# Patient Record
Sex: Male | Born: 1935
Health system: Southern US, Community
[De-identification: ages and names within clinical notes are randomized; demographics above are authoritative.]

## PROBLEM LIST (undated history)

## (undated) DIAGNOSIS — R7303 Prediabetes: Secondary | ICD-10-CM

## (undated) DIAGNOSIS — F0390 Unspecified dementia without behavioral disturbance: Secondary | ICD-10-CM

## (undated) DIAGNOSIS — I639 Cerebral infarction, unspecified: Secondary | ICD-10-CM

## (undated) DIAGNOSIS — F329 Major depressive disorder, single episode, unspecified: Secondary | ICD-10-CM

## (undated) DIAGNOSIS — E559 Vitamin D deficiency, unspecified: Secondary | ICD-10-CM

## (undated) DIAGNOSIS — F32A Depression, unspecified: Secondary | ICD-10-CM

## (undated) DIAGNOSIS — E039 Hypothyroidism, unspecified: Secondary | ICD-10-CM

## (undated) DIAGNOSIS — C61 Malignant neoplasm of prostate: Secondary | ICD-10-CM

## (undated) DIAGNOSIS — E291 Testicular hypofunction: Secondary | ICD-10-CM

## (undated) DIAGNOSIS — C679 Malignant neoplasm of bladder, unspecified: Secondary | ICD-10-CM

## (undated) DIAGNOSIS — I10 Essential (primary) hypertension: Secondary | ICD-10-CM

## (undated) HISTORY — DX: Essential (primary) hypertension: I10

## (undated) HISTORY — DX: Major depressive disorder, single episode, unspecified: F32.9

## (undated) HISTORY — DX: Prediabetes: R73.03

## (undated) HISTORY — DX: Malignant neoplasm of prostate: C61

## (undated) HISTORY — DX: Vitamin D deficiency, unspecified: E55.9

## (undated) HISTORY — DX: Hypothyroidism, unspecified: E03.9

## (undated) HISTORY — PX: BLADDER SURGERY: SHX569

## (undated) HISTORY — PX: PROSTATE SURGERY: SHX751

## (undated) HISTORY — DX: Testicular hypofunction: E29.1

## (undated) HISTORY — DX: Depression, unspecified: F32.A

---

## 1978-03-06 DIAGNOSIS — I639 Cerebral infarction, unspecified: Secondary | ICD-10-CM

## 1978-03-06 HISTORY — DX: Cerebral infarction, unspecified: I63.9

## 1997-04-06 HISTORY — PX: BIOPSY PROSTATE: PRO28

## 1997-06-04 ENCOUNTER — Encounter: Admission: RE | Admit: 1997-06-04 | Discharge: 1997-09-02 | Payer: Self-pay | Admitting: Radiation Oncology

## 1997-07-07 ENCOUNTER — Ambulatory Visit (HOSPITAL_COMMUNITY): Admission: RE | Admit: 1997-07-07 | Discharge: 1997-07-08 | Payer: Self-pay | Admitting: Urology

## 1997-09-03 HISTORY — PX: CYSTOURETHROSCOPY: SHX476

## 1997-09-21 ENCOUNTER — Observation Stay (HOSPITAL_COMMUNITY): Admission: RE | Admit: 1997-09-21 | Discharge: 1997-09-22 | Payer: Self-pay | Admitting: Urology

## 1999-01-12 ENCOUNTER — Encounter: Admission: RE | Admit: 1999-01-12 | Discharge: 1999-01-12 | Payer: Self-pay | Admitting: Urology

## 1999-01-12 ENCOUNTER — Encounter: Payer: Self-pay | Admitting: Urology

## 2000-04-06 ENCOUNTER — Encounter: Payer: Self-pay | Admitting: Internal Medicine

## 2000-04-06 ENCOUNTER — Ambulatory Visit (HOSPITAL_COMMUNITY): Admission: RE | Admit: 2000-04-06 | Discharge: 2000-04-06 | Payer: Self-pay | Admitting: Internal Medicine

## 2002-11-27 ENCOUNTER — Encounter: Payer: Self-pay | Admitting: Internal Medicine

## 2002-11-27 ENCOUNTER — Ambulatory Visit (HOSPITAL_COMMUNITY): Admission: RE | Admit: 2002-11-27 | Discharge: 2002-11-27 | Payer: Self-pay | Admitting: Internal Medicine

## 2003-12-16 ENCOUNTER — Ambulatory Visit (HOSPITAL_COMMUNITY): Admission: RE | Admit: 2003-12-16 | Discharge: 2003-12-16 | Payer: Self-pay | Admitting: Internal Medicine

## 2005-03-06 HISTORY — PX: PROSTATE SURGERY: SHX751

## 2005-10-05 ENCOUNTER — Encounter (HOSPITAL_COMMUNITY): Admission: RE | Admit: 2005-10-05 | Discharge: 2005-11-28 | Payer: Self-pay | Admitting: Urology

## 2005-11-23 ENCOUNTER — Ambulatory Visit (HOSPITAL_COMMUNITY): Admission: RE | Admit: 2005-11-23 | Discharge: 2005-11-23 | Payer: Self-pay | Admitting: Urology

## 2007-10-14 ENCOUNTER — Ambulatory Visit (HOSPITAL_COMMUNITY): Admission: RE | Admit: 2007-10-14 | Discharge: 2007-10-14 | Payer: Self-pay | Admitting: Internal Medicine

## 2007-10-31 ENCOUNTER — Ambulatory Visit: Payer: Self-pay | Admitting: Vascular Surgery

## 2010-03-27 ENCOUNTER — Encounter: Payer: Self-pay | Admitting: Vascular Surgery

## 2010-07-19 NOTE — Assessment & Plan Note (Signed)
OFFICE VISIT   [name] [name], [name] [name]  DOB:  1927-05-07                                       10/31/2007  HQION#:[id number]   The patient is a 77 year old male referred by Dr. Oneta Rack for evaluation  of abdominal aortic aneurysm.  He has a history of prostate cancer and  recently had a CT scan of his abdomen and pelvis performed for weight  loss.  CT scan was done without contrast due to a creatinine of 1.9.  The CT scan showed a saccular aneurysm arising from the infrarenal  abdominal aorta and possibly left common iliac artery.  The patient  denies any symptoms of abdominal pain or back pain.  He has no family  history of aneurysm.  He denies any fever, chills or infectious signs or  symptoms.  He has no history of night sweats.  His prostate cancer was  treated with radioactive seeds and he had followup with Dr. Patsi Sears  yesterday.  He stated that his cancer apparently is under control.   His atherosclerotic risk factors include hypertension, coronary artery  disease and elevated cholesterol.   PAST MEDICAL HISTORY:  His past medical history is otherwise fairly  unremarkable.   PAST SURGICAL HISTORY:  He has had no previous operations.   MEDICATIONS:  1. Include Diovan/HCT 160/12.5 once a day.  2. Fluoxetine 20 mg once a day.  3. Gemfibrozil 600 mg two a day.  4. Levoxyl 75 mcg once a day.  5. Verapamil hydrochloride 240 mg once a day.  6. Vesicare 10 mg once a day.  7. Vitamin D 8 tablets daily.   ALLERGIES:  He is allergic to penicillin which causes swelling and rash.   FAMILY HISTORY:  Remarkable for his mother who died of coronary artery  disease, his father died of a stroke.   SOCIAL HISTORY:  He is married and has one child.  He is a former  smoker, quit in 1997.   REVIEW OF SYSTEMS:  CONSTITUTIONAL:  He has had some weight loss  recently.  He is 5 feet 9 inches and 178 pounds.  Cardiac, pulmonary, GI, vascular, neurologic, orthopedic,  psychiatric,  hematologic review of systems are all negative.  RENAL:  He has some urinary frequency.  ENT:  He has had some decline in his hearing recently.   PHYSICAL EXAM:  Vital signs:  Blood pressure is 132/72 in the right arm,  heart rate 69 and regular.  HEENT:  Unremarkable.  Neck:  Has 2+ carotid  pulses without bruit.  Chest:  Clear to auscultation.  Cardiac:  Exam is  regular rate and rhythm.  Abdomen:  Soft, nontender, nondistended.  No  pulsatile mass.  Extremities:  He has 2+ radial, femoral, popliteal,  dorsalis pedis and posterior tibial pulses bilaterally.   I reviewed his CT scan from [facility] dated 10/15/2007.  Again, this  was done without IV contrast.  This shows a 3.4 cm abdominal aortic  aneurysm.  I am unable to identify any specific portion of the aorta  that has any saccular component to it.  It is basically a fusiform small  abdominal aortic aneurysm with no real significant focal outpouchings or  evidence of pseudoaneurysm.   IMPRESSION:  A 3.4 cm abdominal aortic aneurysm with no obvious evidence  of infectious component.   PLAN:  Is to  repeat his CT scan again in six months' time.  If the CT  scan is stable at that time we may go to once yearly or every 6 month  ultrasounds.   Janetta Hora. Fields, MD  Electronically Signed   CEF/MEDQ  D:  11/01/2007  T:  11/01/2007  Job:  [id number]   cc:   Lucky Cowboy, M.D.
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
## 2011-05-24 DIAGNOSIS — E559 Vitamin D deficiency, unspecified: Secondary | ICD-10-CM | POA: Diagnosis not present

## 2011-05-24 DIAGNOSIS — E782 Mixed hyperlipidemia: Secondary | ICD-10-CM | POA: Diagnosis not present

## 2011-05-24 DIAGNOSIS — Z79899 Other long term (current) drug therapy: Secondary | ICD-10-CM | POA: Diagnosis not present

## 2011-05-24 DIAGNOSIS — R7309 Other abnormal glucose: Secondary | ICD-10-CM | POA: Diagnosis not present

## 2011-05-24 DIAGNOSIS — I10 Essential (primary) hypertension: Secondary | ICD-10-CM | POA: Diagnosis not present

## 2011-08-29 DIAGNOSIS — R7309 Other abnormal glucose: Secondary | ICD-10-CM | POA: Diagnosis not present

## 2011-08-29 DIAGNOSIS — E782 Mixed hyperlipidemia: Secondary | ICD-10-CM | POA: Diagnosis not present

## 2011-08-29 DIAGNOSIS — C61 Malignant neoplasm of prostate: Secondary | ICD-10-CM | POA: Diagnosis not present

## 2011-08-29 DIAGNOSIS — Z125 Encounter for screening for malignant neoplasm of prostate: Secondary | ICD-10-CM | POA: Diagnosis not present

## 2011-08-29 DIAGNOSIS — I10 Essential (primary) hypertension: Secondary | ICD-10-CM | POA: Diagnosis not present

## 2011-08-29 DIAGNOSIS — E559 Vitamin D deficiency, unspecified: Secondary | ICD-10-CM | POA: Diagnosis not present

## 2011-08-29 DIAGNOSIS — Z79899 Other long term (current) drug therapy: Secondary | ICD-10-CM | POA: Diagnosis not present

## 2011-08-29 DIAGNOSIS — Z1212 Encounter for screening for malignant neoplasm of rectum: Secondary | ICD-10-CM | POA: Diagnosis not present

## 2011-08-29 DIAGNOSIS — N419 Inflammatory disease of prostate, unspecified: Secondary | ICD-10-CM | POA: Diagnosis not present

## 2011-10-02 DIAGNOSIS — C61 Malignant neoplasm of prostate: Secondary | ICD-10-CM | POA: Diagnosis not present

## 2011-10-02 DIAGNOSIS — C679 Malignant neoplasm of bladder, unspecified: Secondary | ICD-10-CM | POA: Diagnosis not present

## 2011-10-30 DIAGNOSIS — H5200 Hypermetropia, unspecified eye: Secondary | ICD-10-CM | POA: Diagnosis not present

## 2011-10-30 DIAGNOSIS — H2510 Age-related nuclear cataract, unspecified eye: Secondary | ICD-10-CM | POA: Diagnosis not present

## 2011-11-30 DIAGNOSIS — E559 Vitamin D deficiency, unspecified: Secondary | ICD-10-CM | POA: Diagnosis not present

## 2011-11-30 DIAGNOSIS — I10 Essential (primary) hypertension: Secondary | ICD-10-CM | POA: Diagnosis not present

## 2011-11-30 DIAGNOSIS — Z79899 Other long term (current) drug therapy: Secondary | ICD-10-CM | POA: Diagnosis not present

## 2011-11-30 DIAGNOSIS — R7309 Other abnormal glucose: Secondary | ICD-10-CM | POA: Diagnosis not present

## 2011-11-30 DIAGNOSIS — N3000 Acute cystitis without hematuria: Secondary | ICD-10-CM | POA: Diagnosis not present

## 2011-11-30 DIAGNOSIS — Z23 Encounter for immunization: Secondary | ICD-10-CM | POA: Diagnosis not present

## 2011-11-30 DIAGNOSIS — E782 Mixed hyperlipidemia: Secondary | ICD-10-CM | POA: Diagnosis not present

## 2011-12-04 DIAGNOSIS — H903 Sensorineural hearing loss, bilateral: Secondary | ICD-10-CM | POA: Diagnosis not present

## 2012-01-01 DIAGNOSIS — C61 Malignant neoplasm of prostate: Secondary | ICD-10-CM | POA: Diagnosis not present

## 2012-03-13 DIAGNOSIS — E559 Vitamin D deficiency, unspecified: Secondary | ICD-10-CM | POA: Diagnosis not present

## 2012-03-13 DIAGNOSIS — N3000 Acute cystitis without hematuria: Secondary | ICD-10-CM | POA: Diagnosis not present

## 2012-03-13 DIAGNOSIS — C61 Malignant neoplasm of prostate: Secondary | ICD-10-CM | POA: Diagnosis not present

## 2012-03-13 DIAGNOSIS — I10 Essential (primary) hypertension: Secondary | ICD-10-CM | POA: Diagnosis not present

## 2012-03-13 DIAGNOSIS — Z79899 Other long term (current) drug therapy: Secondary | ICD-10-CM | POA: Diagnosis not present

## 2012-03-13 DIAGNOSIS — R7309 Other abnormal glucose: Secondary | ICD-10-CM | POA: Diagnosis not present

## 2012-03-13 DIAGNOSIS — E782 Mixed hyperlipidemia: Secondary | ICD-10-CM | POA: Diagnosis not present

## 2012-03-25 DIAGNOSIS — C61 Malignant neoplasm of prostate: Secondary | ICD-10-CM | POA: Diagnosis not present

## 2012-04-12 DIAGNOSIS — C61 Malignant neoplasm of prostate: Secondary | ICD-10-CM | POA: Diagnosis not present

## 2012-04-12 DIAGNOSIS — C679 Malignant neoplasm of bladder, unspecified: Secondary | ICD-10-CM | POA: Diagnosis not present

## 2012-04-17 ENCOUNTER — Other Ambulatory Visit (HOSPITAL_COMMUNITY): Payer: Self-pay | Admitting: Urology

## 2012-04-17 DIAGNOSIS — C61 Malignant neoplasm of prostate: Secondary | ICD-10-CM

## 2012-04-17 DIAGNOSIS — C679 Malignant neoplasm of bladder, unspecified: Secondary | ICD-10-CM

## 2012-04-26 ENCOUNTER — Encounter (HOSPITAL_COMMUNITY)
Admission: RE | Admit: 2012-04-26 | Discharge: 2012-04-26 | Disposition: A | Discharge status: Home / Self Care | Payer: Medicare Other | Source: Ambulatory Visit | Attending: Urology | Admitting: Urology

## 2012-04-26 DIAGNOSIS — C61 Malignant neoplasm of prostate: Secondary | ICD-10-CM | POA: Diagnosis not present

## 2012-04-26 DIAGNOSIS — C679 Malignant neoplasm of bladder, unspecified: Secondary | ICD-10-CM | POA: Diagnosis not present

## 2012-04-26 MED ORDER — TECHNETIUM TC 99M MEDRONATE IV KIT: 25.0000 | PACK | Freq: Once | INTRAVENOUS | Status: AC | PRN

## 2012-04-26 MED ORDER — TECHNETIUM TC 99M MEDRONATE IV KIT
25.0000 | PACK | Freq: Once | INTRAVENOUS | Status: AC | PRN
  Administered 2012-04-26: 25 via INTRAVENOUS

## 2012-05-21 DIAGNOSIS — C679 Malignant neoplasm of bladder, unspecified: Secondary | ICD-10-CM | POA: Diagnosis not present

## 2012-05-21 DIAGNOSIS — C61 Malignant neoplasm of prostate: Secondary | ICD-10-CM | POA: Diagnosis not present

## 2012-05-24 DIAGNOSIS — H6120 Impacted cerumen, unspecified ear: Secondary | ICD-10-CM | POA: Diagnosis not present

## 2012-06-19 DIAGNOSIS — E782 Mixed hyperlipidemia: Secondary | ICD-10-CM | POA: Diagnosis not present

## 2012-06-19 DIAGNOSIS — I10 Essential (primary) hypertension: Secondary | ICD-10-CM | POA: Diagnosis not present

## 2012-06-19 DIAGNOSIS — R7309 Other abnormal glucose: Secondary | ICD-10-CM | POA: Diagnosis not present

## 2012-07-16 DIAGNOSIS — F341 Dysthymic disorder: Secondary | ICD-10-CM | POA: Diagnosis not present

## 2012-07-16 DIAGNOSIS — I10 Essential (primary) hypertension: Secondary | ICD-10-CM | POA: Diagnosis not present

## 2012-07-16 DIAGNOSIS — N3000 Acute cystitis without hematuria: Secondary | ICD-10-CM | POA: Diagnosis not present

## 2012-08-12 DIAGNOSIS — H6120 Impacted cerumen, unspecified ear: Secondary | ICD-10-CM | POA: Diagnosis not present

## 2012-08-12 DIAGNOSIS — H905 Unspecified sensorineural hearing loss: Secondary | ICD-10-CM | POA: Diagnosis not present

## 2012-08-12 DIAGNOSIS — M1710 Unilateral primary osteoarthritis, unspecified knee: Secondary | ICD-10-CM | POA: Diagnosis not present

## 2012-09-23 DIAGNOSIS — H9209 Otalgia, unspecified ear: Secondary | ICD-10-CM | POA: Diagnosis not present

## 2012-09-23 DIAGNOSIS — H7200 Central perforation of tympanic membrane, unspecified ear: Secondary | ICD-10-CM | POA: Diagnosis not present

## 2012-09-23 DIAGNOSIS — H6120 Impacted cerumen, unspecified ear: Secondary | ICD-10-CM | POA: Diagnosis not present

## 2012-09-23 DIAGNOSIS — H908 Mixed conductive and sensorineural hearing loss, unspecified: Secondary | ICD-10-CM | POA: Diagnosis not present

## 2012-09-25 DIAGNOSIS — E782 Mixed hyperlipidemia: Secondary | ICD-10-CM | POA: Diagnosis not present

## 2012-09-25 DIAGNOSIS — Z79899 Other long term (current) drug therapy: Secondary | ICD-10-CM | POA: Diagnosis not present

## 2012-09-25 DIAGNOSIS — R7309 Other abnormal glucose: Secondary | ICD-10-CM | POA: Diagnosis not present

## 2012-09-25 DIAGNOSIS — Z1212 Encounter for screening for malignant neoplasm of rectum: Secondary | ICD-10-CM | POA: Diagnosis not present

## 2012-09-25 DIAGNOSIS — C61 Malignant neoplasm of prostate: Secondary | ICD-10-CM | POA: Diagnosis not present

## 2012-09-25 DIAGNOSIS — I10 Essential (primary) hypertension: Secondary | ICD-10-CM | POA: Diagnosis not present

## 2012-09-25 DIAGNOSIS — E559 Vitamin D deficiency, unspecified: Secondary | ICD-10-CM | POA: Diagnosis not present

## 2012-11-11 DIAGNOSIS — N3000 Acute cystitis without hematuria: Secondary | ICD-10-CM | POA: Diagnosis not present

## 2012-11-20 DIAGNOSIS — C61 Malignant neoplasm of prostate: Secondary | ICD-10-CM | POA: Diagnosis not present

## 2012-11-22 DIAGNOSIS — C61 Malignant neoplasm of prostate: Secondary | ICD-10-CM | POA: Diagnosis not present

## 2012-11-22 DIAGNOSIS — R32 Unspecified urinary incontinence: Secondary | ICD-10-CM | POA: Diagnosis not present

## 2012-11-22 DIAGNOSIS — Z8551 Personal history of malignant neoplasm of bladder: Secondary | ICD-10-CM | POA: Diagnosis not present

## 2012-11-22 DIAGNOSIS — R972 Elevated prostate specific antigen [PSA]: Secondary | ICD-10-CM | POA: Diagnosis not present

## 2012-12-18 DIAGNOSIS — Z23 Encounter for immunization: Secondary | ICD-10-CM | POA: Diagnosis not present

## 2012-12-24 ENCOUNTER — Encounter: Payer: Self-pay | Admitting: *Deleted

## 2012-12-24 ENCOUNTER — Encounter: Payer: Self-pay | Admitting: Cardiovascular Disease

## 2012-12-25 ENCOUNTER — Ambulatory Visit (INDEPENDENT_AMBULATORY_CARE_PROVIDER_SITE_OTHER): Payer: Medicare Other | Admitting: Cardiovascular Disease

## 2012-12-25 ENCOUNTER — Encounter: Payer: Self-pay | Admitting: Cardiovascular Disease

## 2012-12-25 VITALS — BP 136/76 | HR 59 | Ht 69.0 in | Wt 170.4 lb

## 2012-12-25 DIAGNOSIS — R0600 Dyspnea, unspecified: Secondary | ICD-10-CM

## 2012-12-25 DIAGNOSIS — I10 Essential (primary) hypertension: Secondary | ICD-10-CM | POA: Diagnosis not present

## 2012-12-25 DIAGNOSIS — I4510 Unspecified right bundle-branch block: Secondary | ICD-10-CM | POA: Insufficient documentation

## 2012-12-25 DIAGNOSIS — R5383 Other fatigue: Secondary | ICD-10-CM

## 2012-12-25 DIAGNOSIS — R0609 Other forms of dyspnea: Secondary | ICD-10-CM

## 2012-12-25 DIAGNOSIS — R5381 Other malaise: Secondary | ICD-10-CM | POA: Diagnosis not present

## 2012-12-25 NOTE — Assessment & Plan Note (Signed)
Not likely related to heart No evidence of chronotropic incompetence F/u echo.  Consider stopping diuretic for BP with elevated BUN/Cr

## 2012-12-25 NOTE — Assessment & Plan Note (Signed)
Avoid beta blockers with relatively slow HR and RBBB with left axis deviation.  Consider stopping diuretic with elevated BUN/Cr

## 2012-12-25 NOTE — Progress Notes (Signed)
Patient ID: [name] [name], male   DOB: [birth year], 79 y.o.   MRN: [id number] 79 yo referred by Dr Oneta Rack.  His notes are not very legible.  CRF;s elevated lipids and HTN.  ? Referred for fatigue dyspnea and abnormal ECG.  No previous heart issues Describes ? MI in early 90;s but he said he doubted it Just had some stomach pain.  He is retired. Hard of hearing and seems a bit depressed. Married 65 years.  Has slowed down some with mild exertional dyspnea Denies chest pain to me.  ECG with RBBB and no high grade AV block.  Compliant with HTN meds  Notes and labs from primary reviewed 09/25/12  K 4 Cr 1.66 BUN 32 LDL 81 TSH 2.1 A1c 5.6  ROS: Denies fever, malais, weight loss, blurry vision, decreased visual acuity, cough, sputum, SOB, hemoptysis, pleuritic pain, palpitaitons, heartburn, abdominal pain, melena, lower extremity edema, claudication, or rash.  All other systems reviewed and negative   General: Affect appropriate Healthy:  appears stated age HEENT: normal Neck supple with no adenopathy JVP normal no bruits no thyromegaly Lungs clear with no wheezing and good diaphragmatic motion Heart:  S1/S2 no murmur,rub, gallop or click PMI normal Abdomen: benighn, BS positve, no tenderness, no AAA no bruit.  No HSM or HJR Distal pulses intact with no bruits No edema Neuro non-focal Skin warm and dry No muscular weakness  Medications Current Outpatient Prescriptions  Medication Sig Dispense Refill  . aspirin EC 81 MG tablet Take 81 mg by mouth daily.      . citalopram (CELEXA) 20 MG tablet Take 20 mg by mouth daily.      Marland Kitchen levothyroxine (SYNTHROID, LEVOTHROID) 75 MCG tablet Take 75 mcg by mouth daily before breakfast.      . losartan-hydrochlorothiazide (HYZAAR) 100-12.5 MG per tablet Take 1 tablet by mouth daily.      . solifenacin (VESICARE) 10 MG tablet Take by mouth daily.      . verapamil (COVERA HS) 240 MG (CO) 24 hr tablet Take 240 mg by mouth at bedtime.       No current  facility-administered medications for this visit.    Allergies Penicillins  Family History: No family history on file.  Social History: History   Social History  . Marital Status: Married    Spouse Name: N/A    Number of Children: N/A  . Years of Education: N/A   Occupational History  . Not on file.   Social History Main Topics  . Smoking status: Former Smoker    Types: Cigarettes    Quit date: 12/25/1996  . Smokeless tobacco: Never Used  . Alcohol Use: No  . Drug Use: No  . Sexual Activity: Not on file   Other Topics Concern  . Not on file   Social History Narrative  . No narrative on file    Electrocardiogram:SR rate 52 RBBB LAD no old MI  Assessment and Plan

## 2012-12-25 NOTE — Assessment & Plan Note (Signed)
Normal cardiopulmonary exam.  No AS murmur  Echo.  Labs ok

## 2012-12-25 NOTE — Patient Instructions (Signed)

## 2012-12-25 NOTE — Assessment & Plan Note (Signed)
No old ECG to compare Benign no high grade heart block

## 2013-01-01 DIAGNOSIS — E559 Vitamin D deficiency, unspecified: Secondary | ICD-10-CM | POA: Diagnosis not present

## 2013-01-01 DIAGNOSIS — E782 Mixed hyperlipidemia: Secondary | ICD-10-CM | POA: Diagnosis not present

## 2013-01-01 DIAGNOSIS — Z79899 Other long term (current) drug therapy: Secondary | ICD-10-CM | POA: Diagnosis not present

## 2013-01-01 DIAGNOSIS — R7309 Other abnormal glucose: Secondary | ICD-10-CM | POA: Diagnosis not present

## 2013-01-01 DIAGNOSIS — I10 Essential (primary) hypertension: Secondary | ICD-10-CM | POA: Diagnosis not present

## 2013-01-01 DIAGNOSIS — N3000 Acute cystitis without hematuria: Secondary | ICD-10-CM | POA: Diagnosis not present

## 2013-01-09 ENCOUNTER — Ambulatory Visit (HOSPITAL_COMMUNITY): Payer: Medicare Other | Attending: Cardiology

## 2013-01-09 DIAGNOSIS — R0989 Other specified symptoms and signs involving the circulatory and respiratory systems: Secondary | ICD-10-CM | POA: Insufficient documentation

## 2013-01-09 DIAGNOSIS — R0600 Dyspnea, unspecified: Secondary | ICD-10-CM

## 2013-01-09 DIAGNOSIS — I4510 Unspecified right bundle-branch block: Secondary | ICD-10-CM | POA: Diagnosis not present

## 2013-01-09 DIAGNOSIS — R5381 Other malaise: Secondary | ICD-10-CM | POA: Insufficient documentation

## 2013-01-09 DIAGNOSIS — I079 Rheumatic tricuspid valve disease, unspecified: Secondary | ICD-10-CM | POA: Diagnosis not present

## 2013-01-09 DIAGNOSIS — R0602 Shortness of breath: Secondary | ICD-10-CM | POA: Diagnosis not present

## 2013-01-09 DIAGNOSIS — R5383 Other fatigue: Secondary | ICD-10-CM

## 2013-01-09 DIAGNOSIS — I359 Nonrheumatic aortic valve disorder, unspecified: Secondary | ICD-10-CM | POA: Insufficient documentation

## 2013-01-09 DIAGNOSIS — I10 Essential (primary) hypertension: Secondary | ICD-10-CM | POA: Diagnosis not present

## 2013-01-09 DIAGNOSIS — Z87891 Personal history of nicotine dependence: Secondary | ICD-10-CM | POA: Insufficient documentation

## 2013-01-09 DIAGNOSIS — I059 Rheumatic mitral valve disease, unspecified: Secondary | ICD-10-CM | POA: Insufficient documentation

## 2013-01-09 DIAGNOSIS — R0609 Other forms of dyspnea: Secondary | ICD-10-CM | POA: Diagnosis not present

## 2013-01-09 NOTE — Progress Notes (Signed)
Echocardiogram performed.  

## 2013-01-13 ENCOUNTER — Telehealth: Payer: Self-pay | Admitting: *Deleted

## 2013-01-13 NOTE — Telephone Encounter (Signed)
PT  AND PT'S WIFE AWARE  ECHO  OKAY  AV SCLEROSIS./CY

## 2013-03-13 ENCOUNTER — Other Ambulatory Visit: Payer: Self-pay | Admitting: Internal Medicine

## 2013-04-01 DIAGNOSIS — E039 Hypothyroidism, unspecified: Secondary | ICD-10-CM | POA: Insufficient documentation

## 2013-04-01 DIAGNOSIS — R7303 Prediabetes: Secondary | ICD-10-CM | POA: Insufficient documentation

## 2013-04-01 DIAGNOSIS — E349 Endocrine disorder, unspecified: Secondary | ICD-10-CM | POA: Insufficient documentation

## 2013-04-01 DIAGNOSIS — E559 Vitamin D deficiency, unspecified: Secondary | ICD-10-CM | POA: Insufficient documentation

## 2013-04-02 ENCOUNTER — Encounter: Payer: Self-pay | Admitting: Internal Medicine

## 2013-04-02 ENCOUNTER — Ambulatory Visit (INDEPENDENT_AMBULATORY_CARE_PROVIDER_SITE_OTHER): Payer: PRIVATE HEALTH INSURANCE | Admitting: Internal Medicine

## 2013-04-02 VITALS — BP 124/60 | HR 64 | Temp 98.2°F | Resp 16 | Wt 168.6 lb

## 2013-04-02 DIAGNOSIS — I10 Essential (primary) hypertension: Secondary | ICD-10-CM | POA: Diagnosis not present

## 2013-04-02 DIAGNOSIS — E559 Vitamin D deficiency, unspecified: Secondary | ICD-10-CM | POA: Diagnosis not present

## 2013-04-02 DIAGNOSIS — E785 Hyperlipidemia, unspecified: Secondary | ICD-10-CM | POA: Insufficient documentation

## 2013-04-02 DIAGNOSIS — R7309 Other abnormal glucose: Secondary | ICD-10-CM

## 2013-04-02 DIAGNOSIS — Z79899 Other long term (current) drug therapy: Secondary | ICD-10-CM | POA: Diagnosis not present

## 2013-04-02 DIAGNOSIS — R7303 Prediabetes: Secondary | ICD-10-CM

## 2013-04-02 DIAGNOSIS — E782 Mixed hyperlipidemia: Secondary | ICD-10-CM

## 2013-04-02 NOTE — Patient Instructions (Signed)

## 2013-04-02 NOTE — Progress Notes (Signed)
Patient ID: [name] [name], male   DOB: [birth year], 81 y.o.   MRN: [id number]   This very nice 80 y.o. MWM presents for 3 month follow up with Hypertension, Hyperlipidemia, Pre-Diabetes and Vitamin D Deficiency.    HTN predates since 62. BP has been controlled at home. Today's BP: 124/60 mmHg . Patient denies any cardiac type chest pain, palpitations, dyspnea/orthopnea/PND, dizziness, claudication, or dependent edema.   Hyperlipidemia is controlled with diet & meds. Last Cholesterol was  181 , Triglycerides were 298, HDL 38 and LDL 83 in Oct 2014 at goal.. Patient denies myalgias or other med SE's.    Also, the patient has history of PreDiabetes with A1c 5.8% in 2011 and with last A1c of  5.7% in Oct 2014. Patient denies any symptoms of reactive hypoglycemia, diabetic polys, paresthesias or visual blurring.   Further, Patient has history of Vitamin D Deficiency of 28 in 2008 with last vitamin D of 49 in Oct 2014. Patient supplements vitamin D without any suspected side-effects.    Medication List         aspirin EC 81 MG tablet  Take 81 mg by mouth daily.     cholecalciferol 1000 UNITS tablet  Commonly known as:  VITAMIN D  Take 1,000 Units by mouth 2 (two) times daily.     citalopram 20 MG tablet  Commonly known as:  CELEXA  Take 20 mg by mouth daily.     levothyroxine 75 MCG tablet  Commonly known as:  SYNTHROID, LEVOTHROID  Take 75 mcg by mouth daily before breakfast.     losartan-hydrochlorothiazide 100-12.5 MG per tablet  Commonly known as:  HYZAAR  take 1 tablet by mouth every morning for blood pressure     solifenacin 10 MG tablet  Commonly known as:  VESICARE  Take by mouth daily.     verapamil 240 MG (CO) 24 hr tablet  Commonly known as:  COVERA HS  Take 240 mg by mouth at bedtime.         Allergies  Allergen Reactions  . Penicillins     PMHx:   Past Medical History  Diagnosis Date  . HTN (hypertension)   . Vitamin D deficiency   . Prostate  & Bladder  cancer   . Hypothyroid   . Prediabetes   . Other testicular hypofunction   . Depression     FHx:    Reviewed / unchanged  SHx:    Reviewed / unchanged  Systems Review: Constitutional: Denies fever, chills, wt changes, headaches, insomnia, fatigue, night sweats, change in appetite. Eyes: Denies redness, blurred vision, diplopia, discharge, itchy, watery eyes.  ENT: Denies discharge, congestion, post nasal drip, epistaxis, sore throat, earache, hearing loss, dental pain, tinnitus, vertigo, sinus pain, snoring.  CV: Denies chest pain, palpitations, irregular heartbeat, syncope, dyspnea, diaphoresis, orthopnea, PND, claudication, edema. Respiratory: denies cough, dyspnea, DOE, pleurisy, hoarseness, laryngitis, wheezing.  Gastrointestinal: Denies dysphagia, odynophagia, heartburn, reflux, water brash, abdominal pain or cramps, nausea, vomiting, bloating, diarrhea, constipation, hematemesis, melena, hematochezia,  or hemorrhoids. Genitourinary: Denies dysuria, frequency, urgency, nocturia, hesitancy, discharge, hematuria, flank pain. Musculoskeletal: Denies arthralgias, myalgias, stiffness, jt. swelling, pain, limp, strain/sprain.  Skin: Denies pruritus, rash, hives, warts, acne, eczema, change in skin lesion(s). Neuro: No weakness, tremor, incoordination, spasms, paresthesia, or pain. Psychiatric: Denies confusion, memory loss, or sensory loss. Endo: Denies change in weight, skin, hair change.  Heme/Lymph: No excessive bleeding, bruising, orenlarged lymph nodes.  Filed Vitals:   04/02/13 1401  BP: 124/60  Pulse:  64  Temp: 98.2 F (36.8 C)  Resp: 16    Estimated body mass index is 24.89 kg/(m^2) as calculated from the following:   Height as of 12/25/12: 5\' 9"  (1.753 m).   Weight as of this encounter: 168 lb 9.6 oz (76.476 kg).  On Exam: Appears well nourished - in no distress. Eyes: PERRLA, EOMs, conjunctiva no swelling or erythema. Sinuses: No frontal/maxillary  tenderness ENT/Mouth: EAC's clear, TM's nl w/o erythema, bulging. Nares clear w/o erythema, swelling, exudates. Oropharynx clear without erythema or exudates. Oral hygiene is good. Tongue normal, non obstructing. Hearing intact.  Neck: Supple. Thyroid nl. Car 2+/2+ without bruits, nodes or JVD. Chest: Respirations nl with BS clear & equal w/o rales, rhonchi, wheezing or stridor.  Cor: Heart sounds normal w/ regular rate and rhythm without sig. murmurs, gallops, clicks, or rubs. Peripheral pulses normal and equal  without edema.  Abdomen: Soft & bowel sounds normal. Non-tender w/o guarding, rebound, hernias, masses, or organomegaly.  Lymphatics: Unremarkable.  Musculoskeletal: Full ROM all peripheral extremities, joint stability, 5/5 strength, and normal gait.  Skin: Warm, dry without exposed rashes, lesions, ecchymosis apparent.  Neuro: Cranial nerves intact, reflexes equal bilaterally. Sensory-motor testing grossly intact. Tendon reflexes grossly intact.  Pysch: Alert & oriented x 3. Insight and judgement nl & appropriate. No ideations.  Assessment and Plan:  1. Hypertension - Continue monitor blood pressure at home. Continue diet/meds same.  2. Hyperlipidemia - Continue diet/meds, exercise,& lifestyle modifications. Continue monitor periodic cholesterol/liver & renal functions   3. Pre-diabetes/Insulin Resistance - Continue diet, exercise, lifestyle modifications. Monitor appropriate labs.  4. Vitamin D Deficiency - Continue supplementation.  Recommended regular exercise, BP monitoring, weight control, and discussed med and SE's. Recommended labs to assess and monitor clinical status. Further disposition pending results of labs.

## 2013-04-03 LAB — CBC WITH DIFFERENTIAL/PLATELET
BASOS ABS: 0 10*3/uL (ref 0.0–0.1)
Basophils Relative: 0 % (ref 0–1)
EOS ABS: 0.2 10*3/uL (ref 0.0–0.7)
EOS PCT: 3 % (ref 0–5)
HEMATOCRIT: 40.7 % (ref 39.0–52.0)
Hemoglobin: 13.8 g/dL (ref 13.0–17.0)
Lymphocytes Relative: 17 % (ref 12–46)
Lymphs Abs: 1.1 10*3/uL (ref 0.7–4.0)
MCH: 29.7 pg (ref 26.0–34.0)
MCHC: 33.9 g/dL (ref 30.0–36.0)
MCV: 87.5 fL (ref 78.0–100.0)
MONO ABS: 0.4 10*3/uL (ref 0.1–1.0)
Monocytes Relative: 6 % (ref 3–12)
Neutro Abs: 4.5 10*3/uL (ref 1.7–7.7)
Neutrophils Relative %: 74 % (ref 43–77)
PLATELETS: 189 10*3/uL (ref 150–400)
RBC: 4.65 MIL/uL (ref 4.22–5.81)
RDW: 14.4 % (ref 11.5–15.5)
WBC: 6.1 10*3/uL (ref 4.0–10.5)

## 2013-04-03 LAB — HEMOGLOBIN A1C
HEMOGLOBIN A1C: 5.7 % — AB (ref <5.7)
MEAN PLASMA GLUCOSE: 117 mg/dL — AB (ref <117)

## 2013-04-03 LAB — LIPID PANEL
Cholesterol: 174 mg/dL (ref 0–200)
HDL: 38 mg/dL — AB (ref >39)
LDL CALC: 79 mg/dL (ref 0–99)
Total CHOL/HDL Ratio: 4.6 Ratio
Triglycerides: 283 mg/dL — ABNORMAL HIGH (ref <150)
VLDL: 57 mg/dL — ABNORMAL HIGH (ref 0–40)

## 2013-04-03 LAB — HEPATIC FUNCTION PANEL
ALBUMIN: 4 g/dL (ref 3.5–5.2)
ALK PHOS: 76 U/L (ref 39–117)
ALT: 16 U/L (ref 0–53)
AST: 18 U/L (ref 0–37)
BILIRUBIN TOTAL: 0.5 mg/dL (ref 0.2–1.2)
Bilirubin, Direct: 0.1 mg/dL (ref 0.0–0.3)
Indirect Bilirubin: 0.4 mg/dL (ref 0.2–1.2)
TOTAL PROTEIN: 6.3 g/dL (ref 6.0–8.3)

## 2013-04-03 LAB — MAGNESIUM: MAGNESIUM: 1.8 mg/dL (ref 1.5–2.5)

## 2013-04-03 LAB — BASIC METABOLIC PANEL WITH GFR
BUN: 19 mg/dL (ref 6–23)
CALCIUM: 9.8 mg/dL (ref 8.4–10.5)
CHLORIDE: 101 meq/L (ref 96–112)
CO2: 27 meq/L (ref 19–32)
CREATININE: 1.35 mg/dL (ref 0.50–1.35)
GFR, Est African American: 56 mL/min — ABNORMAL LOW
GFR, Est Non African American: 48 mL/min — ABNORMAL LOW
Glucose, Bld: 96 mg/dL (ref 70–99)
Potassium: 3.4 mEq/L — ABNORMAL LOW (ref 3.5–5.3)
Sodium: 139 mEq/L (ref 135–145)

## 2013-04-03 LAB — INSULIN, FASTING: INSULIN FASTING, SERUM: 74 u[IU]/mL — AB (ref 3–28)

## 2013-04-03 LAB — TSH: TSH: 1.93 u[IU]/mL (ref 0.350–4.500)

## 2013-04-03 LAB — VITAMIN D 25 HYDROXY (VIT D DEFICIENCY, FRACTURES): VIT D 25 HYDROXY: 46 ng/mL (ref 30–89)

## 2013-05-19 DIAGNOSIS — C679 Malignant neoplasm of bladder, unspecified: Secondary | ICD-10-CM | POA: Diagnosis not present

## 2013-05-19 DIAGNOSIS — C61 Malignant neoplasm of prostate: Secondary | ICD-10-CM | POA: Diagnosis not present

## 2013-05-21 ENCOUNTER — Other Ambulatory Visit: Payer: Self-pay | Admitting: Internal Medicine

## 2013-05-23 DIAGNOSIS — C679 Malignant neoplasm of bladder, unspecified: Secondary | ICD-10-CM | POA: Diagnosis not present

## 2013-05-23 DIAGNOSIS — N35919 Unspecified urethral stricture, male, unspecified site: Secondary | ICD-10-CM | POA: Diagnosis not present

## 2013-05-23 DIAGNOSIS — C61 Malignant neoplasm of prostate: Secondary | ICD-10-CM | POA: Diagnosis not present

## 2013-07-02 ENCOUNTER — Encounter: Payer: Self-pay | Admitting: Emergency Medicine

## 2013-07-02 ENCOUNTER — Ambulatory Visit (INDEPENDENT_AMBULATORY_CARE_PROVIDER_SITE_OTHER): Payer: Medicare Other | Admitting: Emergency Medicine

## 2013-07-02 VITALS — BP 134/70 | HR 66 | Temp 98.2°F | Resp 16 | Ht 67.5 in | Wt 165.0 lb

## 2013-07-02 DIAGNOSIS — E782 Mixed hyperlipidemia: Secondary | ICD-10-CM

## 2013-07-02 DIAGNOSIS — R7309 Other abnormal glucose: Secondary | ICD-10-CM | POA: Diagnosis not present

## 2013-07-02 DIAGNOSIS — I10 Essential (primary) hypertension: Secondary | ICD-10-CM

## 2013-07-02 LAB — CBC WITH DIFFERENTIAL/PLATELET
Basophils Absolute: 0 10*3/uL (ref 0.0–0.1)
Basophils Relative: 0 % (ref 0–1)
EOS ABS: 0.2 10*3/uL (ref 0.0–0.7)
Eosinophils Relative: 3 % (ref 0–5)
HEMATOCRIT: 39.6 % (ref 39.0–52.0)
HEMOGLOBIN: 13.4 g/dL (ref 13.0–17.0)
LYMPHS ABS: 1.4 10*3/uL (ref 0.7–4.0)
Lymphocytes Relative: 20 % (ref 12–46)
MCH: 29.8 pg (ref 26.0–34.0)
MCHC: 33.8 g/dL (ref 30.0–36.0)
MCV: 88 fL (ref 78.0–100.0)
MONO ABS: 0.5 10*3/uL (ref 0.1–1.0)
MONOS PCT: 7 % (ref 3–12)
NEUTROS PCT: 70 % (ref 43–77)
Neutro Abs: 4.8 10*3/uL (ref 1.7–7.7)
Platelets: 180 10*3/uL (ref 150–400)
RBC: 4.5 MIL/uL (ref 4.22–5.81)
RDW: 14.6 % (ref 11.5–15.5)
WBC: 6.8 10*3/uL (ref 4.0–10.5)

## 2013-07-02 LAB — BASIC METABOLIC PANEL WITH GFR
BUN: 22 mg/dL (ref 6–23)
CALCIUM: 9.3 mg/dL (ref 8.4–10.5)
CHLORIDE: 104 meq/L (ref 96–112)
CO2: 28 meq/L (ref 19–32)
CREATININE: 1.22 mg/dL (ref 0.50–1.35)
GFR, Est African American: 63 mL/min
GFR, Est Non African American: 54 mL/min — ABNORMAL LOW
GLUCOSE: 85 mg/dL (ref 70–99)
Potassium: 3.8 mEq/L (ref 3.5–5.3)
Sodium: 138 mEq/L (ref 135–145)

## 2013-07-02 LAB — HEPATIC FUNCTION PANEL
ALBUMIN: 4.1 g/dL (ref 3.5–5.2)
ALT: 21 U/L (ref 0–53)
AST: 20 U/L (ref 0–37)
Alkaline Phosphatase: 64 U/L (ref 39–117)
Bilirubin, Direct: 0.1 mg/dL (ref 0.0–0.3)
Indirect Bilirubin: 0.4 mg/dL (ref 0.2–1.2)
TOTAL PROTEIN: 6.1 g/dL (ref 6.0–8.3)
Total Bilirubin: 0.5 mg/dL (ref 0.2–1.2)

## 2013-07-02 LAB — LIPID PANEL
CHOLESTEROL: 183 mg/dL (ref 0–200)
HDL: 41 mg/dL (ref >39)
LDL CALC: 98 mg/dL (ref 0–99)
TRIGLYCERIDES: 220 mg/dL — AB (ref <150)
Total CHOL/HDL Ratio: 4.5 Ratio
VLDL: 44 mg/dL — ABNORMAL HIGH (ref 0–40)

## 2013-07-02 NOTE — Patient Instructions (Signed)
Stress Management Stress is a state of physical or mental tension that often results from changes in your life or normal routine. Some common causes of stress are:  Death of a loved one.  Injuries or severe illnesses.  Getting fired or changing jobs.  Moving into a new home. Other causes may be:  Sexual problems.  Business or financial losses.  Taking on a large debt.  Regular conflict with someone at home or at work.  Constant tiredness from lack of sleep. It is not just bad things that are stressful. It may be stressful to:  Win the lottery.  Get married.  Buy a new car. The amount of stress that can be easily tolerated varies from person to person. Changes generally cause stress, regardless of the types of change. Too much stress can affect your health. It may lead to physical or emotional problems. Too little stress (boredom) may also become stressful. SUGGESTIONS TO REDUCE STRESS:  Talk things over with your family and friends. It often is helpful to share your concerns and worries. If you feel your problem is serious, you may want to get help from a professional counselor.  Consider your problems one at a time instead of lumping them all together. Trying to take care of everything at once may seem impossible. List all the things you need to do and then start with the most important one. Set a goal to accomplish 2 or 3 things each day. If you expect to do too many in a single day you will naturally fail, causing you to feel even more stressed.  Do not use alcohol or drugs to relieve stress. Although you may feel better for a short time, they do not remove the problems that caused the stress. They can also be habit forming.  Exercise regularly - at least 3 times per week. Physical exercise can help to relieve that "uptight" feeling and will relax you.  The shortest distance between despair and hope is often a good night's sleep.  Go to bed and get up on time allowing  yourself time for appointments without being rushed.  Take a short "time-out" period from any stressful situation that occurs during the day. Close your eyes and take some deep breaths. Starting with the muscles in your face, tense them, hold it for a few seconds, then relax. Repeat this with the muscles in your neck, shoulders, hand, stomach, back and legs.  Take good care of yourself. Eat a balanced diet and get plenty of rest.  Schedule time for having fun. Take a break from your daily routine to relax. HOME CARE INSTRUCTIONS   Call if you feel overwhelmed by your problems and feel you can no longer manage them on your own.  Return immediately if you feel like hurting yourself or someone else. Document Released: 08/16/2000 Document Revised: 05/15/2011 Document Reviewed: 10/15/2012 [employer] Patient Information 2014 [employer].  

## 2013-07-02 NOTE — Progress Notes (Signed)
Subjective:    Patient ID: [name] [name], male    DOB: [birth year], 80 y.o.   MRN: [id number]  HPI Comments: 80 yo WM presents for 3 month F/U for HTN, Cholesterol, Pre-Dm, D. deficient .He is under increased stress with wives declining health. He has had to increase activity around the house but is not getting routine cardio. He notes BP is good at home. He notes mild fatigue with extra chores but resolves with rest.   Last labs  CHOL         174   04/02/2013 HDL           38   04/02/2013 LDLCALC       79   04/02/2013 TRIG         283   04/02/2013 CHOLHDL      4.6   04/02/2013 ALT           16   04/02/2013 AST           18   04/02/2013 ALKPHOS       76   04/02/2013 BILITOT      0.5   04/02/2013 CREATININE     1.35   04/02/2013 BUN              19   04/02/2013 NA              139   04/02/2013 K               3.4   04/02/2013 CL              101   04/02/2013 CO2              27   04/02/2013 WBC      6.1   04/02/2013 HGB     13.8   04/02/2013 HCT     40.7   04/02/2013 MCV     87.5   04/02/2013 PLT      189   04/02/2013 HGBA1C      5.7   04/02/2013   Hypertension    Current Outpatient Prescriptions on File Prior to Visit  Medication Sig Dispense Refill  . aspirin EC 81 MG tablet Take 81 mg by mouth daily.      . cholecalciferol (VITAMIN D) 1000 UNITS tablet Take 1,000 Units by mouth 2 (two) times daily.      . citalopram (CELEXA) 20 MG tablet Take 20 mg by mouth daily.      Marland Kitchen levothyroxine (SYNTHROID, LEVOTHROID) 75 MCG tablet Take 75 mcg by mouth daily before breakfast.      . losartan-hydrochlorothiazide (HYZAAR) 100-12.5 MG per tablet take 1 tablet by mouth every morning for blood pressure  90 tablet  1  . solifenacin (VESICARE) 10 MG tablet Take by mouth daily.      . verapamil (CALAN-SR) 240 MG CR tablet take 1 tablet by mouth daily  90 tablet  3   No current facility-administered medications on file prior to visit.   Allergies  Allergen Reactions  . Penicillins    Past Medical  History  Diagnosis Date  . HTN (hypertension)   . Vitamin D deficiency   . Prostate cancer   . Hypothyroid   . Prediabetes   . Other testicular hypofunction   . Depression      Review of Systems  Constitutional: Positive for fatigue.  All other systems reviewed and are negative.  BP 134/70  Pulse 66  Temp(Src) 98.2  F (36.8 C) (Temporal)  Resp 16  Ht 5' 7.5" (1.715 m)  Wt 165 lb (74.844 kg)  BMI 25.45 kg/m2     Objective:   Physical Exam  Nursing note and vitals reviewed. Constitutional: He is oriented to person, place, and time. He appears well-developed and well-nourished.  HENT:  Head: Normocephalic and atraumatic.  Right Ear: External ear normal.  Left Ear: External ear normal.  Nose: Nose normal.  Eyes: Conjunctivae and EOM are normal.  Neck: Normal range of motion. Neck supple. No JVD present. No thyromegaly present.  Cardiovascular: Normal rate, regular rhythm, normal heart sounds and intact distal pulses.   Pulmonary/Chest: Effort normal and breath sounds normal.  Abdominal: Soft. Bowel sounds are normal. He exhibits no distension and no mass. There is no tenderness. There is no rebound and no guarding.  Musculoskeletal: Normal range of motion. He exhibits no edema and no tenderness.  Lymphadenopathy:    He has no cervical adenopathy.  Neurological: He is alert and oriented to person, place, and time. He has normal reflexes. No cranial nerve deficit. Coordination normal.  Skin: Skin is warm and dry.  Psychiatric: He has a normal mood and affect. His behavior is normal. Judgment and thought content normal.          Assessment & Plan:  1.  3 month F/U for HTN, Cholesterol, Pre-Dm, D. Deficient. Needs healthy diet, cardio QD and obtain healthy weight. Check Labs, Check BP if >130/80 call office   2. Fatigue- check labs, increase activity and H2O vs Stress- Advised to call if symptoms increase for RX

## 2013-07-03 LAB — HEMOGLOBIN A1C
HEMOGLOBIN A1C: 6 % — AB (ref <5.7)
Mean Plasma Glucose: 126 mg/dL — ABNORMAL HIGH (ref <117)

## 2013-08-22 ENCOUNTER — Other Ambulatory Visit: Payer: Self-pay | Admitting: Internal Medicine

## 2013-09-30 ENCOUNTER — Encounter: Payer: Self-pay | Admitting: Internal Medicine

## 2013-11-20 ENCOUNTER — Other Ambulatory Visit: Payer: Self-pay | Admitting: Internal Medicine

## 2013-11-20 NOTE — Telephone Encounter (Signed)
Patient needs OV past due

## 2013-12-20 ENCOUNTER — Other Ambulatory Visit: Payer: Self-pay | Admitting: Internal Medicine

## 2014-01-28 ENCOUNTER — Other Ambulatory Visit: Payer: Self-pay

## 2014-01-28 MED ORDER — LOSARTAN POTASSIUM-HCTZ 100-12.5 MG PO TABS: 1.0000 | ORAL_TABLET | Freq: Every morning | ORAL | Status: DC

## 2014-02-26 ENCOUNTER — Other Ambulatory Visit: Payer: Self-pay | Admitting: Physician Assistant

## 2014-03-10 ENCOUNTER — Encounter: Payer: Self-pay | Admitting: Physician Assistant

## 2014-03-10 ENCOUNTER — Ambulatory Visit (INDEPENDENT_AMBULATORY_CARE_PROVIDER_SITE_OTHER): Payer: Medicare Other | Admitting: Physician Assistant

## 2014-03-10 VITALS — BP 124/62 | HR 72 | Temp 98.2°F | Resp 16 | Ht 67.5 in | Wt 158.0 lb

## 2014-03-10 DIAGNOSIS — R7309 Other abnormal glucose: Secondary | ICD-10-CM | POA: Diagnosis not present

## 2014-03-10 DIAGNOSIS — E785 Hyperlipidemia, unspecified: Secondary | ICD-10-CM

## 2014-03-10 DIAGNOSIS — I10 Essential (primary) hypertension: Secondary | ICD-10-CM

## 2014-03-10 DIAGNOSIS — E559 Vitamin D deficiency, unspecified: Secondary | ICD-10-CM

## 2014-03-10 DIAGNOSIS — E039 Hypothyroidism, unspecified: Secondary | ICD-10-CM | POA: Diagnosis not present

## 2014-03-10 DIAGNOSIS — N3281 Overactive bladder: Secondary | ICD-10-CM

## 2014-03-10 DIAGNOSIS — Z79899 Other long term (current) drug therapy: Secondary | ICD-10-CM | POA: Diagnosis not present

## 2014-03-10 DIAGNOSIS — F32A Depression, unspecified: Secondary | ICD-10-CM

## 2014-03-10 DIAGNOSIS — R7303 Prediabetes: Secondary | ICD-10-CM

## 2014-03-10 DIAGNOSIS — F329 Major depressive disorder, single episode, unspecified: Secondary | ICD-10-CM

## 2014-03-10 LAB — CBC WITH DIFFERENTIAL/PLATELET
BASOS ABS: 0 10*3/uL (ref 0.0–0.1)
BASOS PCT: 0 % (ref 0–1)
EOS ABS: 0.2 10*3/uL (ref 0.0–0.7)
Eosinophils Relative: 3 % (ref 0–5)
HCT: 43.1 % (ref 39.0–52.0)
HEMOGLOBIN: 14.4 g/dL (ref 13.0–17.0)
LYMPHS ABS: 1.1 10*3/uL (ref 0.7–4.0)
Lymphocytes Relative: 17 % (ref 12–46)
MCH: 30 pg (ref 26.0–34.0)
MCHC: 33.4 g/dL (ref 30.0–36.0)
MCV: 89.8 fL (ref 78.0–100.0)
MPV: 9.9 fL (ref 8.6–12.4)
Monocytes Absolute: 0.4 10*3/uL (ref 0.1–1.0)
Monocytes Relative: 6 % (ref 3–12)
NEUTROS PCT: 74 % (ref 43–77)
Neutro Abs: 4.8 10*3/uL (ref 1.7–7.7)
Platelets: 215 10*3/uL (ref 150–400)
RBC: 4.8 MIL/uL (ref 4.22–5.81)
RDW: 14.3 % (ref 11.5–15.5)
WBC: 6.5 10*3/uL (ref 4.0–10.5)

## 2014-03-10 MED ORDER — CITALOPRAM HYDROBROMIDE 20 MG PO TABS
ORAL_TABLET | ORAL | Status: DC
Start: 2014-03-10 — End: 2014-04-01

## 2014-03-10 MED ORDER — LOSARTAN POTASSIUM-HCTZ 100-12.5 MG PO TABS: 1.0000 | ORAL_TABLET | Freq: Every morning | ORAL | Status: DC

## 2014-03-10 NOTE — Patient Instructions (Addendum)
- Continue medications as prescribed.  Please increase Celexa 20mg - Take 1 and 1/2 tablets daily for mood and depression.  If you are having more depression or suicidal thoughts, then please go to [facility] immediately or call 911.  [facility] Address: [address] [place], [place] [address]  Phone: [phone]  Please call minister and ask about activities you can do to be more active.  Please follow up in 3 weeks with Dr. Melford Aase.  Depression Depression refers to feeling sad, low, down in the dumps, blue, gloomy, or empty. In general, there are two kinds of depression: 1. Normal sadness or normal grief. This kind of depression is one that we all feel from time to time after upsetting life experiences, such as the loss of a job or the ending of a relationship. This kind of depression is considered normal, is short lived, and resolves within a few days to 2 weeks. Depression experienced after the loss of a loved one (bereavement) often lasts longer than 2 weeks but normally gets better with time. 2. Clinical depression. This kind of depression lasts longer than normal sadness or normal grief or interferes with your ability to function at home, at work, and in school. It also interferes with your personal relationships. It affects almost every aspect of your life. Clinical depression is an illness. Symptoms of depression can also be caused by conditions other than those mentioned above, such as:  Physical illness. Some physical illnesses, including underactive thyroid gland (hypothyroidism), severe anemia, specific types of cancer, diabetes, uncontrolled seizures, heart and lung problems, strokes, and chronic pain are commonly associated with symptoms of depression.  Side effects of some prescription medicine. In some people, certain types of medicine can cause symptoms of depression.  Substance abuse. Abuse of alcohol and illicit drugs can cause symptoms of  depression. SYMPTOMS Symptoms of normal sadness and normal grief include the following:  Feeling sad or crying for short periods of time.  Not caring about anything (apathy).  Difficulty sleeping or sleeping too much.  No longer able to enjoy the things you used to enjoy.  Desire to be by oneself all the time (social isolation).  Lack of energy or motivation.  Difficulty concentrating or remembering.  Change in appetite or weight.  Restlessness or agitation. Symptoms of clinical depression include the same symptoms of normal sadness or normal grief and also the following symptoms:  Feeling sad or crying all the time.  Feelings of guilt or worthlessness.  Feelings of hopelessness or helplessness.  Thoughts of suicide or the desire to harm yourself (suicidal ideation).  Loss of touch with reality (psychotic symptoms). Seeing or hearing things that are not real (hallucinations) or having false beliefs about your life or the people around you (delusions and paranoia). DIAGNOSIS  The diagnosis of clinical depression is usually based on how bad the symptoms are and how long they have lasted. Your health care provider will also ask you questions about your medical history and substance use to find out if physical illness, use of prescription medicine, or substance abuse is causing your depression. Your health care provider may also order blood tests. TREATMENT  Often, normal sadness and normal grief do not require treatment. However, sometimes antidepressant medicine is given for bereavement to ease the depressive symptoms until they resolve. The treatment for clinical depression depends on how bad the symptoms are but often includes antidepressant medicine, counseling with a mental health professional, or both. Your health care provider will  help to determine what treatment is best for you. Depression caused by physical illness usually goes away with appropriate medical treatment of  the illness. If prescription medicine is causing depression, talk with your health care provider about stopping the medicine, decreasing the dose, or changing to another medicine. Depression caused by the abuse of alcohol or illicit drugs goes away when you stop using these substances. Some adults need professional help in order to stop drinking or using drugs. SEEK IMMEDIATE MEDICAL CARE IF:  You have thoughts about hurting yourself or others.  You lose touch with reality (have psychotic symptoms).  You are taking medicine for depression and have a serious side effect. FOR MORE INFORMATION  National Alliance on Mental Illness: www.nami.[employer]: https://carter.com/ Document Released: 02/18/2000 Document Revised: 07/07/2013 Document Reviewed: 05/22/2011 [facility] Patient Information 2015 [place], [place]. This information is not intended to replace advice given to you by your health care provider. Make sure you discuss any questions you have with your health care provider.

## 2014-03-10 NOTE — Progress Notes (Signed)
Assessment and Plan:  1. Essential hypertension Continue Hyzaar and Verapamil as prescribed.  Monitor blood pressure at home.  Reminder to go to the ER if any CP, SOB, nausea, dizziness, severe HA, changes vision/speech, left arm numbness and tingling, and jaw pain. - CBC with Differential - BASIC METABOLIC PANEL WITH GFR - Hepatic function panel  2. Hyperlipidemia Please follow recommended diet and exercise.  Check cholesterol. - Lipid panel  3. Prediabetes Please follow recommended diet and exercise.  Check A1C and Insulin levels. - Hemoglobin A1c - Insulin, fasting  4. Hypothyroidism, unspecified hypothyroidism type Please continue Levothyroxine as prescribed. Reminded to take on an empty stomach 30-23mins before food.  Check TSH level. - TSH  5. Vitamin D deficiency Continue vitamin D as prescribed.  Will check vitamin D level at next visit, due to insurance not covering costs for level check today.  6. Encounter for long-term (current) use of medications Will monitor kidney and liver function. - CBC with Differential - BASIC METABOLIC PANEL WITH GFR - Hepatic function panel  7. Depression -Increase Celexa 20mg  to 1 and 1/2 tablets per day- citalopram (CELEXA) 20 MG tablet; take 1 and 1/2 tablets by mouth once daily for mood and depression  Dispense: 60 tablet; Refill: PRN -Told patient to contact Hospice for grief counseling. -Told patient to contact minister to see about daily activities to help with loneliness.  -Told patient if he is having more depression or suicidal thoughts to go to [facility] immediately or call 911.  Patient expressed understanding.  Dr. Melford Aase also spoke with patient for about 15-20 minutes to evaluate his symptoms and suicidal thoughts.  Dr. Melford Aase agreed with treatment plan.  8. Overactive Bladder -Told patient to continue Vesicare as prescribed.  Continue diet and meds as discussed. Further disposition pending results of labs. Discussed  medication effects and SE's.  Pt agreed to treatment plan. Please follow up in 3 weeks with Dr. Melford Aase for medication recheck on Celexa.  HPI A Caucasian 81 y.o. male  presents for 9 month follow up with hypertension, hyperlipidemia, prediabetes, hypothyroidism, depression and vitamin D.  Patient's last visit was 07/02/13 with labs.  Patient states he is feeling more depressed and lonely due to his wife passing in September 2015.  He does admit to suicidal thoughts, but no plans.  Patient is preparing meals for himself and states he is taking care of their cats at home.  Patient states that church friends are calling occasionally to check up on him and see how he is doing.  Patient states he does have one daughter and she calls him regularly to check in to see how he is doing.  Patient denies loss of appetite or trouble sleeping.  His blood pressure has been controlled at home, today their BP is BP: 124/62 mmHg.  He is taking Hyzaar 100-12.5mg  and Verapamil 240mg .  He does not workout. He denies chest pain, shortness of breath, dizziness.   He is not on cholesterol medication and denies myalgias. His cholesterol is not at goal. The cholesterol last visit was:   Lab Results  Component Value Date   CHOL 183 07/02/2013   HDL 41 07/02/2013   LDLCALC 98 07/02/2013   TRIG 220* 07/02/2013   CHOLHDL 4.5 07/02/2013   He has not been working on diet and exercise for prediabetes, and denies increased appetite, polydipsia and polyuria. Last A1C in the office was:  Lab Results  Component Value Date   HGBA1C 6.0* 07/02/2013  Patient is on Vitamin D supplement. - 2,000 IU daily Lab Results  Component Value Date   VD25OH 46 04/02/2013     Hypothyroidism- Taking Levothyroxine 94mcg- Patient states he does take some medications when first getting up and then takes the rest after breakfast.  Current Medications:  Current Outpatient Prescriptions on File Prior to Visit  Medication Sig Dispense Refill  .  aspirin EC 81 MG tablet Take 81 mg by mouth daily.    . cholecalciferol (VITAMIN D) 1000 UNITS tablet Take 1,000 Units by mouth 2 (two) times daily.    . citalopram (CELEXA) 20 MG tablet take 1 tablet by mouth once daily for MOOD 90 tablet PRN  . levothyroxine (SYNTHROID, LEVOTHROID) 75 MCG tablet take 1 tablet by mouth once daily 90 tablet PRN  . solifenacin (VESICARE) 10 MG tablet Take by mouth daily.    . verapamil (CALAN-SR) 240 MG CR tablet take 1 tablet by mouth daily 90 tablet 3   No current facility-administered medications on file prior to visit.   Medical History:  Past Medical History  Diagnosis Date  . HTN (hypertension)   . Vitamin D deficiency   . Prostate cancer   . Hypothyroid   . Prediabetes   . Other testicular hypofunction   . Depression    Allergies:  Allergies  Allergen Reactions  . Penicillins     ROS- Review of Systems  Constitutional: Negative.   HENT: Negative.   Eyes: Negative.   Respiratory: Negative.   Cardiovascular: Negative.   Gastrointestinal: Negative.   Genitourinary: Negative.        Except admits to wearing urinary pad and states he has to get up and run to bathroom in the morning (about 2-3 times.  Neurological: Negative.   Psychiatric/Behavioral: Positive for depression. The patient is nervous/anxious.        Suicidal thoughts, but no plans.   Family history- Review and unchanged Social history- Review and unchanged  Physical Exam: BP 124/62 mmHg  Pulse 72  Temp(Src) 98.2 F (36.8 C) (Temporal)  Resp 16  Ht 5' 7.5" (1.715 m)  Wt 158 lb (71.668 kg)  BMI 24.37 kg/m2  SpO2 97% Wt Readings from Last 3 Encounters:  03/10/14 158 lb (71.668 kg)  07/02/13 165 lb (74.844 kg)  04/02/13 168 lb 9.6 oz (76.476 kg)  Vitals Reviewed. General Appearance: Well nourished, in no apparent distress. Eyes: PERRLA, EOMIs, conjunctiva no swelling or erythema.  No scleral icterus. Sinuses: No Frontal/maxillary tenderness ENT/Mouth: Ext aud  canals clear, TMs without erythema, edema or bulging. No erythema, swelling, or exudate on posterior pharynx.  Tonsils not swollen or erythematous. Patient wears hearing aids. Neck: Supple, thyroid normal.  Respiratory: Respiratory effort normal, CTAB.  No w/r/r or stridor.  Cardio: RRR with no M/R/Gs. Brisk peripheral pulses without edema.  Abdomen: Soft, + normal BS.  Non tender, no guarding, rebound, hernias, masses. Lymphatics: Non tender without lymphadenopathy.  Musculoskeletal: Full ROM, 5/5 strength, normal gait.  Skin: Warm, dry intact without rashes, lesions, ecchymosis.  Neuro: Cranial nerves intact. Normal muscle tone, no cerebellar symptoms. Sensation intact.  Psych: Awake and oriented X 3, blunted affect, Insight and Judgment appropriate.    Temara Lanum, Stephani Police, PA-C 12:27 PM [facility]

## 2014-03-11 LAB — HEPATIC FUNCTION PANEL
ALBUMIN: 4.2 g/dL (ref 3.5–5.2)
ALT: 22 U/L (ref 0–53)
AST: 18 U/L (ref 0–37)
Alkaline Phosphatase: 89 U/L (ref 39–117)
BILIRUBIN TOTAL: 0.5 mg/dL (ref 0.2–1.2)
Bilirubin, Direct: 0.1 mg/dL (ref 0.0–0.3)
Indirect Bilirubin: 0.4 mg/dL (ref 0.2–1.2)
Total Protein: 6.5 g/dL (ref 6.0–8.3)

## 2014-03-11 LAB — BASIC METABOLIC PANEL WITH GFR
BUN: 19 mg/dL (ref 6–23)
CHLORIDE: 103 meq/L (ref 96–112)
CO2: 30 meq/L (ref 19–32)
CREATININE: 1.36 mg/dL — AB (ref 0.50–1.35)
Calcium: 9.6 mg/dL (ref 8.4–10.5)
GFR, EST NON AFRICAN AMERICAN: 47 mL/min — AB
GFR, Est African American: 55 mL/min — ABNORMAL LOW
Glucose, Bld: 84 mg/dL (ref 70–99)
Potassium: 3.9 mEq/L (ref 3.5–5.3)
SODIUM: 139 meq/L (ref 135–145)

## 2014-03-11 LAB — LIPID PANEL
CHOLESTEROL: 182 mg/dL (ref 0–200)
HDL: 48 mg/dL (ref >39)
LDL Cholesterol: 81 mg/dL (ref 0–99)
Total CHOL/HDL Ratio: 3.8 Ratio
Triglycerides: 266 mg/dL — ABNORMAL HIGH (ref <150)
VLDL: 53 mg/dL — ABNORMAL HIGH (ref 0–40)

## 2014-03-11 LAB — HEMOGLOBIN A1C
Hgb A1c MFr Bld: 5.6 % (ref <5.7)
Mean Plasma Glucose: 114 mg/dL (ref <117)

## 2014-03-11 LAB — INSULIN, FASTING: Insulin fasting, serum: 8.9 u[IU]/mL (ref 2.0–19.6)

## 2014-03-11 LAB — TSH: TSH: 2.882 u[IU]/mL (ref 0.350–4.500)

## 2014-04-01 ENCOUNTER — Ambulatory Visit: Payer: Self-pay | Admitting: Internal Medicine

## 2014-04-01 ENCOUNTER — Encounter: Payer: Self-pay | Admitting: Physician Assistant

## 2014-04-01 ENCOUNTER — Ambulatory Visit (INDEPENDENT_AMBULATORY_CARE_PROVIDER_SITE_OTHER): Payer: Medicare Other | Admitting: Physician Assistant

## 2014-04-01 VITALS — BP 122/58 | HR 66 | Temp 98.2°F | Resp 16 | Ht 67.5 in | Wt 160.0 lb

## 2014-04-01 DIAGNOSIS — I10 Essential (primary) hypertension: Secondary | ICD-10-CM

## 2014-04-01 DIAGNOSIS — F329 Major depressive disorder, single episode, unspecified: Secondary | ICD-10-CM | POA: Diagnosis not present

## 2014-04-01 DIAGNOSIS — F32A Depression, unspecified: Secondary | ICD-10-CM

## 2014-04-01 DIAGNOSIS — N3281 Overactive bladder: Secondary | ICD-10-CM

## 2014-04-01 MED ORDER — CITALOPRAM HYDROBROMIDE 20 MG PO TABS
ORAL_TABLET | ORAL | Status: DC
Start: 2014-04-01 — End: 2014-07-27

## 2014-04-01 NOTE — Patient Instructions (Addendum)
-Increae Celexa to 2 tablets daily.   Please continue all other medications as prescribed.  If you are having more depression or suicidal thoughts, then please go to [facility] immediately or call 911.  [facility] 1. Address: [address] [place], [place] [address]  2. Phone: [phone]  Please call minister and ask about activities you can do to be more active.  Please follow up in 2 months (April 2016) with Dr. Melford Aase.  Grief Reaction Grief is a normal response to the death of someone close to you. Feelings of fear, anger, and guilt can affect almost everyone who loses someone they love. Symptoms of depression are also common. These include problems with sleep, loss of appetite, and lack of energy. These grief reaction symptoms often last for weeks to months after a loss. They may also return during special times that remind you of the person you lost, such as an anniversary or birthday. Anxiety, insomnia, irritability, and deep depression may last beyond the period of normal grief. If you experience these feelings for 6 months or longer, you may have clinical depression. Clinical depression requires further medical attention. If you think that you have clinical depression, you should contact your caregiver. If you have a history of depression or a family history of depression, you are at greater risk of clinical depression. You are also at greater risk of developing clinical depression if the loss was traumatic or the loss was of someone with whom you had unresolved issues.  A grief reaction can become complicated by being blocked. This means being unable to cry or express extreme emotions. This may prolong the grieving period and worsen the emotional effects of the loss. Mourning is a natural event in human life. A healthy grief reaction is one that is not blocked. It requires a time of sadness and readjustment. It is very important to share your sorrow and fear with others,  especially close friends and family. Professional counselors and clergy can also help you process your grief. Document Released: 02/20/2005 Document Revised: 07/07/2013 Document Reviewed: 10/31/2005 [facility] Patient Information 2015 [place], [place]. This information is not intended to replace advice given to you by your health care provider. Make sure you discuss any questions you have with your health care provider.   Depression Depression refers to feeling sad, low, down in the dumps, blue, gloomy, or empty. In general, there are two kinds of depression: 3. Normal sadness or normal grief. This kind of depression is one that we all feel from time to time after upsetting life experiences, such as the loss of a job or the ending of a relationship. This kind of depression is considered normal, is short lived, and resolves within a few days to 2 weeks. Depression experienced after the loss of a loved one (bereavement) often lasts longer than 2 weeks but normally gets better with time. 4. Clinical depression. This kind of depression lasts longer than normal sadness or normal grief or interferes with your ability to function at home, at work, and in school. It also interferes with your personal relationships. It affects almost every aspect of your life. Clinical depression is an illness. Symptoms of depression can also be caused by conditions other than those mentioned above, such as:  Physical illness. Some physical illnesses, including underactive thyroid gland (hypothyroidism), severe anemia, specific types of cancer, diabetes, uncontrolled seizures, heart and lung problems, strokes, and chronic pain are commonly associated with symptoms of depression.  Side effects of some  prescription medicine. In some people, certain types of medicine can cause symptoms of depression.  Substance abuse. Abuse of alcohol and illicit drugs can cause symptoms of depression. SYMPTOMS Symptoms of normal sadness and  normal grief include the following:  Feeling sad or crying for short periods of time.  Not caring about anything (apathy).  Difficulty sleeping or sleeping too much.  No longer able to enjoy the things you used to enjoy.  Desire to be by oneself all the time (social isolation).  Lack of energy or motivation.  Difficulty concentrating or remembering.  Change in appetite or weight.  Restlessness or agitation. Symptoms of clinical depression include the same symptoms of normal sadness or normal grief and also the following symptoms:  Feeling sad or crying all the time.  Feelings of guilt or worthlessness.  Feelings of hopelessness or helplessness.  Thoughts of suicide or the desire to harm yourself (suicidal ideation).  Loss of touch with reality (psychotic symptoms). Seeing or hearing things that are not real (hallucinations) or having false beliefs about your life or the people around you (delusions and paranoia). DIAGNOSIS  The diagnosis of clinical depression is usually based on how bad the symptoms are and how long they have lasted. Your health care provider will also ask you questions about your medical history and substance use to find out if physical illness, use of prescription medicine, or substance abuse is causing your depression. Your health care provider may also order blood tests. TREATMENT  Often, normal sadness and normal grief do not require treatment. However, sometimes antidepressant medicine is given for bereavement to ease the depressive symptoms until they resolve. The treatment for clinical depression depends on how bad the symptoms are but often includes antidepressant medicine, counseling with a mental health professional, or both. Your health care provider will help to determine what treatment is best for you. Depression caused by physical illness usually goes away with appropriate medical treatment of the illness. If prescription medicine is causing  depression, talk with your health care provider about stopping the medicine, decreasing the dose, or changing to another medicine. Depression caused by the abuse of alcohol or illicit drugs goes away when you stop using these substances. Some adults need professional help in order to stop drinking or using drugs. SEEK IMMEDIATE MEDICAL CARE IF:  You have thoughts about hurting yourself or others.  You lose touch with reality (have psychotic symptoms).  You are taking medicine for depression and have a serious side effect. FOR MORE INFORMATION  National Alliance on Mental Illness: www.nami.[employer]: https://carter.com/ Document Released: 02/18/2000 Document Revised: 07/07/2013 Document Reviewed: 05/22/2011 [facility] Patient Information 2015 [place], [place]. This information is not intended to replace advice given to you by your health care provider. Make sure you discuss any questions you have with your health care provider.

## 2014-04-01 NOTE — Progress Notes (Signed)
HPI A Caucasian 81 y.o. male presents to the office today for follow up on depression due to wife passing away in September 2015.  Last visit was 03/10/14.  We increased his dose of Celexa 20mg  to 1 and 1/2 tablets per day.  Patient states he does not notice a difference.  He admits to even taking occasional 2 tablets per day.  He does admit to occasional suicidal thought, but no plans.  Patient is preparing meals for himself, going out grocery shopping and taking care of cats at home.  He admits to calling the minister at church and keeping in contact with him and even going out to dinner with him.  His daughter calls every day to check in on her father to see how he is doing.  Patient does not open up very easily about his feelings.  Patient denies loss of appetite or trouble sleeping.  Patient does admit to nocturia and is taking Vesicare as prescribed.  He was wondering if there is anything else he can try.  He is getting up around 3am and going to the bathroom 2-3 times at night.  He does not know if he is drinking water right before bed.    His blood pressure has been controlled at home, today their BP is BP: (!) 122/58 mmHg.  He is taking Hyzaar 100-12.5mg  and Verapamil 240mg .  He does not workout. He denies chest pain, shortness of breath, dizziness.   Past Medical History  Diagnosis Date  . HTN (hypertension)   . Vitamin D deficiency   . Prostate cancer   . Hypothyroid   . Prediabetes   . Other testicular hypofunction   . Depression    Current Medications:  Current Outpatient Prescriptions on File Prior to Visit  Medication Sig Dispense Refill  . aspirin EC 81 MG tablet Take 81 mg by mouth daily.    . cholecalciferol (VITAMIN D) 1000 UNITS tablet Take 1,000 Units by mouth 2 (two) times daily.    . citalopram (CELEXA) 20 MG tablet take 1 and 1/2 tablets by mouth once daily for mood and depression 60 tablet PRN  . levothyroxine (SYNTHROID, LEVOTHROID) 75 MCG tablet take 1 tablet by mouth  once daily 90 tablet PRN  . losartan-hydrochlorothiazide (HYZAAR) 100-12.5 MG per tablet Take 1 tablet by mouth every morning. 90 tablet 1  . solifenacin (VESICARE) 10 MG tablet Take by mouth daily.    . verapamil (CALAN-SR) 240 MG CR tablet take 1 tablet by mouth daily 90 tablet 3   No current facility-administered medications on file prior to visit.   Allergies:  Allergies  Allergen Reactions  . Penicillins     ROS- Review of Systems  Constitutional: Negative.  Negative for fever, chills, malaise/fatigue and diaphoresis.        No loss of appetite. Is going out and buying groceries and taking care of cats at home.  HENT: Negative.   Eyes: Negative.   Respiratory: Negative.  Negative for shortness of breath and wheezing.   Cardiovascular: Negative.  Negative for chest pain.  Gastrointestinal: Negative.  Negative for nausea, vomiting, abdominal pain, diarrhea and constipation.  Genitourinary: Positive for frequency. Negative for dysuria and urgency.  Neurological: Negative.  Negative for dizziness and headaches.  Psychiatric/Behavioral: Positive for depression and suicidal ideas.       Occasional SI thought, but no plans.    Physical Exam: BP 122/58 mmHg  Pulse 66  Temp(Src) 98.2 F (36.8 C) (Temporal)  Resp 16  Ht  5' 7.5" (1.715 m)  Wt 160 lb (72.576 kg)  BMI 24.68 kg/m2  SpO2 97% Wt Readings from Last 3 Encounters:  04/01/14 160 lb (72.576 kg)  03/10/14 158 lb (71.668 kg)  07/02/13 165 lb (74.844 kg)  Vitals Reviewed. General Appearance: Well nourished, in no apparent distress. Eyes: PERRLA, EOMIs, conjunctiva no swelling or erythema.  No scleral icterus. Neck: Supple, thyroid normal.  Respiratory: Respiratory effort normal, CTAB.  No w/r/r or stridor.  Cardio: RRR with no M/R/Gs. Brisk peripheral pulses without edema.  Abdomen: Soft, + normal BS.  Non tender, no guarding, rebound, hernias, masses. Lymphatics: Non tender without lymphadenopathy.  Musculoskeletal: Full  ROM, 5/5 strength, normal gait.  Skin: Warm, dry intact without rashes, lesions, ecchymosis.  Neuro: Cranial nerves intact. Normal muscle tone, no cerebellar symptoms. Sensation intact.  Psych: Awake and oriented X 3, blunted affect, Insight and Judgment appropriate.   Assessment and Plan:  1. Depression -Increase Celexa 20mg  to 2 tablets per day -Told patient to contact Hospice for grief counseling. -Told patient to keep in contact with minister and to ask him about activities.  -Told patient to keep talking with daughter every day. -Told patient if he is having more depression or suicidal thoughts to go to [facility] immediately or call 911.  Patient expressed understanding.  Dr. Melford Aase agreed with treatment plan.  2. Overactive bladder Continue Vesicare as prescribed.  Please pay attention to how much water you are drinking before bedtime. If sxs continue, might try patient on Myrbetriq.  3. Essential hypertension Continue medication as prescribed.  Please drink water to stay hydrated.  Monitor blood pressure at home.  Reminder to go to the ER if any CP, SOB, nausea, dizziness, severe HA, changes vision/speech, left arm numbness and tingling and jaw pain.  Discussed medication effects and SE's.  Pt agreed to treatment plan. Please follow up in April 2016 with Dr. Melford Aase.  Nyjae Hodge, Stephani Police, PA-C 3:00 PM [facility]

## 2014-05-18 ENCOUNTER — Other Ambulatory Visit: Payer: Self-pay | Admitting: Internal Medicine

## 2014-07-08 ENCOUNTER — Ambulatory Visit: Payer: Medicare Other | Admitting: Internal Medicine

## 2014-07-08 NOTE — Progress Notes (Signed)
Patient ID: [name] [name], male   DOB: 10-Jul-1927, 81 y.o.   MRN: [id number]  [name]

## 2014-07-27 ENCOUNTER — Ambulatory Visit (INDEPENDENT_AMBULATORY_CARE_PROVIDER_SITE_OTHER): Payer: Medicare Other | Admitting: Internal Medicine

## 2014-07-27 ENCOUNTER — Encounter: Payer: Self-pay | Admitting: Internal Medicine

## 2014-07-27 VITALS — BP 120/58 | HR 68 | Temp 98.1°F | Resp 18 | Ht 67.5 in | Wt 156.2 lb

## 2014-07-27 DIAGNOSIS — E559 Vitamin D deficiency, unspecified: Secondary | ICD-10-CM

## 2014-07-27 DIAGNOSIS — F32A Depression, unspecified: Secondary | ICD-10-CM

## 2014-07-27 DIAGNOSIS — Z79899 Other long term (current) drug therapy: Secondary | ICD-10-CM

## 2014-07-27 DIAGNOSIS — R7309 Other abnormal glucose: Secondary | ICD-10-CM

## 2014-07-27 DIAGNOSIS — R7303 Prediabetes: Secondary | ICD-10-CM

## 2014-07-27 DIAGNOSIS — E785 Hyperlipidemia, unspecified: Secondary | ICD-10-CM

## 2014-07-27 DIAGNOSIS — I10 Essential (primary) hypertension: Secondary | ICD-10-CM

## 2014-07-27 DIAGNOSIS — E039 Hypothyroidism, unspecified: Secondary | ICD-10-CM

## 2014-07-27 DIAGNOSIS — F329 Major depressive disorder, single episode, unspecified: Secondary | ICD-10-CM

## 2014-07-27 MED ORDER — CITALOPRAM HYDROBROMIDE 20 MG PO TABS: ORAL_TABLET | ORAL | Status: DC

## 2014-07-27 NOTE — Progress Notes (Signed)
Patient ID: [name] [name], male   DOB: 1927-11-06, 81 y.o.   MRN: [id number]   This very nice 81 y.o. [name] [name] presents for 3 month follow up with Hypertension, Hyperlipidemia, Pre-Diabetes and Vitamin D Deficiency. Patient's spouse of 64 years died in 02/10/23 under Hospice care for end stage COPD. Since then atient has been followed by Hospice with home visits. Per daughterDiana Oletta [name] he has been very lonely & depressed and there have been concerns about him getting lost several times while driving and forgetting to pay bills to the point of [place] nearly turning off his power. She also has concerns about his personal hygiene (he's in [place]).   Patient is treated for HTN & BP has been controlled at home. Today's BP: (!) 120/58 mmHg. Patient has had no complaints of any cardiac type chest pain, palpitations, dyspnea/orthopnea/PND, dizziness, claudication, or dependent edema.   Hyperlipidemia is controlled with diet & meds. Patient denies myalgias or other med SE's. Last Lipids were at goal - Total Chol 182; HDL 48; LDL 81; and elevated Triglycerides 266 on 03/10/2014.   Also, the patient has history of PreDiabetes with A1c 5.8% since 2011  and has had no symptoms of reactive hypoglycemia, diabetic polys, paresthesias or visual blurring.  Last A1c was 5.6% on 03/10/2014.   Further, the patient also has history of Vitamin D Deficiency and supplements vitamin D without any suspected side-effects. Last vitamin D was 46 in Jan 2015.     Medication Sig  . aspirin EC 81 MG tablet Take 81 mg by mouth daily.  Marland Kitchen VITAMIN D 1000 UNITS tablet Take 1,000 Units by mouth 2 (two) times daily.  Marland Kitchen levothyroxine  75 MCG tablet take 1 tablet by mouth once daily  . losartan-hctz 100-12.5 MG Take 1 tablet by mouth every morning.  . solifenacin (VESICARE) 10 MG tablet Take by mouth daily.  . verapamil (CALAN-SR) 240 MG CR tablet take 1 capsule by mouth once daily  . citalopram (CELEXA) 20 MG tablet Take 1&1/2 tablets  QDaily for mood and depression   Allergies  Allergen Reactions  . Penicillins    PMHx:   Past Medical History  Diagnosis Date  . HTN (hypertension)   . Vitamin D deficiency   . Prostate cancer   . Hypothyroid   . Prediabetes   . Other testicular hypofunction   . Depression    Immunization History  Administered Date(s) Administered  . Influenza Split 12/18/2012  . Pneumococcal Polysaccharide-23 03/06/2006  . Td 03/06/2004   Past Surgical History  Procedure Laterality Date  . Biopsy prostate  2/99    pos for adenocarcinoma of prostate  . Cystourethroscopy  7/99    and TUR of bladder cancer  . Prostate surgery  2007   FHx:    Reviewed / unchanged  SHx:    Reviewed / unchanged  Systems Review:  Constitutional: Denies fever, chills, wt changes, headaches, insomnia, fatigue, night sweats, change in appetite. Eyes: Denies redness, blurred vision, diplopia, discharge, itchy, watery eyes.  ENT: Denies discharge, congestion, post nasal drip, epistaxis, sore throat, earache, hearing loss, dental pain, tinnitus, vertigo, sinus pain, snoring.  CV: Denies chest pain, palpitations, irregular heartbeat, syncope, dyspnea, diaphoresis, orthopnea, PND, claudication or edema. Respiratory: denies cough, dyspnea, DOE, pleurisy, hoarseness, laryngitis, wheezing.  Gastrointestinal: Denies dysphagia, odynophagia, heartburn, reflux, water brash, abdominal pain or cramps, nausea, vomiting, bloating, diarrhea, constipation, hematemesis, melena, hematochezia  or hemorrhoids. Genitourinary: Denies dysuria, frequency, urgency, nocturia, hesitancy, discharge, hematuria or flank pain. Musculoskeletal:  Denies arthralgias, myalgias, stiffness, jt. swelling, pain, limping or strain/sprain.  Skin: Denies pruritus, rash, hives, warts, acne, eczema or change in skin lesion(s). Neuro: No weakness, tremor, incoordination, spasms, paresthesia or pain. Psychiatric: Denies confusion, memory loss or sensory  loss. Endo: Denies change in weight, skin or hair change.  Heme/Lymph: No excessive bleeding, bruising or enlarged lymph nodes.  Physical Exam  BP 120/58   Pulse 68  Temp 98.1 F   Resp 18  Ht 5' 7.5"   Wt 156 lb 3.2 oz     BMI 24.09   Appears well nourished and in no distress. Eyes: PERRLA, EOMs, conjunctiva no swelling or erythema. Sinuses: No frontal/maxillary tenderness ENT/Mouth: EAC's clear, TM's nl w/o erythema, bulging. Nares clear w/o erythema, swelling, exudates. Oropharynx clear without erythema or exudates. Oral hygiene is good. Tongue normal, non obstructing. Hearing intact.  Neck: Supple. Thyroid nl. Car 2+/2+ without bruits, nodes or JVD. Chest: Respirations nl with BS clear & equal w/o rales, rhonchi, wheezing or stridor.  Cor: Heart sounds normal w/ regular rate and rhythm without sig. murmurs, gallops, clicks, or rubs. Peripheral pulses normal and equal  without edema.  Abdomen: Soft & bowel sounds normal. Non-tender w/o guarding, rebound, hernias, masses, or organomegaly.  Lymphatics: Unremarkable.  Musculoskeletal: Full ROM all peripheral extremities, joint stability, 5/5 strength, and normal gait.  Skin: Warm, dry without exposed rashes, lesions or ecchymosis apparent.  Neuro: Cranial nerves intact, reflexes equal bilaterally. Sensory-motor testing grossly intact. Tendon reflexes grossly intact.  Pysch: Alert & oriented x 3.  Very flat affect. Insight and judgement limited. No ideations.  Assessment and Plan:   1. Essential hypertension  - TSH  2. Hyperlipidemia  - Lipid panel  3. Prediabetes  - Hemoglobin A1c - Insulin, random  4. Vitamin D deficiency  - Vit D  25 hydroxy (rtn osteoporosis monitoring)  5. Hypothyroidism, unspecified hypothyroidism type   6. 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  Consider MRI and EGG if further events.   - encouraged patient to allow daughter to manage finances, check writing , etc with  him.   - citalopram (CELEXA) 20 MG tablet; Take 1&1/2 tablets (= 30 mg) Daily for mood and depression  Dispense: 135 tablet; Refill: 1  7. Encounter for long-term (current) use of medications  - CBC with Differential/Platelet - BASIC METABOLIC PANEL WITH GFR - Hepatic function panel - Magnesium   Recommended regular exercise, BP monitoring, weight control, and discussed med and SE's. Recommended labs to assess and monitor clinical status. Further disposition pending results of labs. Over 30 minutes of exam, counseling, chart review was performed

## 2014-07-27 NOTE — Patient Instructions (Signed)
Depression Depression refers to feeling sad, low, down in the dumps, blue, gloomy, or empty. In general, there are two kinds of depression: 1. Normal sadness or normal grief. This kind of depression is one that we all feel from time to time after upsetting life experiences, such as the loss of a job or the ending of a relationship. This kind of depression is considered normal, is short lived, and resolves within a few days to 2 weeks. Depression experienced after the loss of a loved one (bereavement) often lasts longer than 2 weeks but normally gets better with time. 2. Clinical depression. This kind of depression lasts longer than normal sadness or normal grief or interferes with your ability to function at home, at work, and in school. It also interferes with your personal relationships. It affects almost every aspect of your life. Clinical depression is an illness. Symptoms of depression can also be caused by conditions other than those mentioned above, such as:  Physical illness. Some physical illnesses, including underactive thyroid gland (hypothyroidism), severe anemia, specific types of cancer, diabetes, uncontrolled seizures, heart and lung problems, strokes, and chronic pain are commonly associated with symptoms of depression.  Side effects of some prescription medicine. In some people, certain types of medicine can cause symptoms of depression.  Substance abuse. Abuse of alcohol and illicit drugs can cause symptoms of depression. SYMPTOMS Symptoms of normal sadness and normal grief include the following:  Feeling sad or crying for short periods of time.  Not caring about anything (apathy).  Difficulty sleeping or sleeping too much.  No longer able to enjoy the things you used to enjoy.  Desire to be by oneself all the time (social isolation).  Lack of energy or motivation.  Difficulty concentrating or remembering.  Change in appetite or weight.  Restlessness or  agitation. Symptoms of clinical depression include the same symptoms of normal sadness or normal grief and also the following symptoms:  Feeling sad or crying all the time.  Feelings of guilt or worthlessness.  Feelings of hopelessness or helplessness.  Thoughts of suicide or the desire to harm yourself (suicidal ideation).  Loss of touch with reality (psychotic symptoms). Seeing or hearing things that are not real (hallucinations) or having false beliefs about your life or the people around you (delusions and paranoia). DIAGNOSIS  The diagnosis of clinical depression is usually based on how bad the symptoms are and how long they have lasted. Your health care provider will also ask you questions about your medical history and substance use to find out if physical illness, use of prescription medicine, or substance abuse is causing your depression. Your health care provider may also order blood tests. TREATMENT  Often, normal sadness and normal grief do not require treatment. However, sometimes antidepressant medicine is given for bereavement to ease the depressive symptoms until they resolve. The treatment for clinical depression depends on how bad the symptoms are but often includes antidepressant medicine, counseling with a mental health professional, or both. Your health care provider will help to determine what treatment is best for you. Depression caused by physical illness usually goes away with appropriate medical treatment of the illness. If prescription medicine is causing depression, talk with your health care provider about stopping the medicine, decreasing the dose, or changing to another medicine. Depression caused by the abuse of alcohol or illicit drugs goes away when you stop using these substances. Some adults need professional help in order to stop drinking or using drugs. SEEK IMMEDIATE MEDICAL   CARE IF:  You have thoughts about hurting yourself or others.  You lose touch  with reality (have psychotic symptoms).  You are taking medicine for depression and have a serious side effect. FOR MORE INFORMATION  National Alliance on Mental Illness: www.nami.[employer]: https://carter.com/   ======================================== Recommend Adult Low dose Aspirin or baby Aspirin 81 mg daily   To reduce risk of Colon Cancer 20 %,   Skin Cancer 26 % ,   Melanoma 46%   and   Pancreatic cancer 60%  ++++++++++++++++++  Vitamin D goal is between 70-100.   Please make sure that you are taking your Vitamin D as directed.   It is very important as a natural anti-inflammatory   helping hair, skin, and nails, as well as reducing stroke and heart attack risk.   It helps your bones and helps with mood.  It also decreases numerous cancer risks so please take it as directed.   Low Vit D is associated with a 200-300% higher risk for CANCER   and 200-300% higher risk for HEART   ATTACK  &  STROKE.    .....................................Marland Kitchen  It is also associated with higher death rate at younger ages,   autoimmune diseases like Rheumatoid arthritis, Lupus, Multiple Sclerosis.     Also many other serious conditions, like depression, Alzheimer's  Dementia, infertility, muscle aches, fatigue, fibromyalgia - just to name a few.  +++++++++++++++++++    Recommend the book "The END of DIETING" by Dr Excell Seltzer   & the book "The END of DIABETES " by Dr Excell Seltzer  At [email or web address] - get book & Audio CD's     Being diabetic has a  300% increased risk for heart attack, stroke, cancer, and alzheimer- type vascular dementia. It is very important that you work harder with diet by avoiding all foods that are white. Avoid white rice (brown & wild rice is OK), white potatoes (sweetpotatoes in moderation is OK), White bread or wheat bread or anything made out of white flour like bagels, donuts, rolls, buns, biscuits, cakes, pastries,  cookies, pizza crust, and pasta (made from white flour & egg whites) - vegetarian pasta or spinach or wheat pasta is OK. Multigrain breads like Arnold's or Pepperidge Farm, or multigrain sandwich thins or flatbreads.  Diet, exercise and weight loss can reverse and cure diabetes in the early stages.  Diet, exercise and weight loss is very important in the control and prevention of complications of diabetes which affects every system in your body, ie. Brain - dementia/stroke, eyes - glaucoma/blindness, heart - heart attack/heart failure, kidneys - dialysis, stomach - gastric paralysis, intestines - malabsorption, nerves - severe painful neuritis, circulation - gangrene & loss of a leg(s), and finally cancer and Alzheimers.    I recommend avoid fried & greasy foods,  sweets/candy, white rice (brown or wild rice or Quinoa is OK), white potatoes (sweet potatoes are OK) - anything made from white flour - bagels, doughnuts, rolls, buns, biscuits,white and wheat breads, pizza crust and traditional pasta made of white flour & egg white(vegetarian pasta or spinach or wheat pasta is OK).  Multi-grain bread is OK - like multi-grain flat bread or sandwich thins. Avoid alcohol in excess. Exercise is also important.    Eat all the vegetables you want - avoid meat, especially red meat and dairy - especially cheese.  Cheese is the most concentrated form of trans-fats which is the worst thing to clog up our arteries. Veggie  cheese is OK which can be found in the fresh produce section at [facility] or [employer] or [employer]  ++++++++++++++++++++++++++

## 2014-07-28 LAB — BASIC METABOLIC PANEL WITH GFR
BUN: 20 mg/dL (ref 6–23)
CO2: 26 mEq/L (ref 19–32)
CREATININE: 1.27 mg/dL (ref 0.50–1.35)
Calcium: 9.3 mg/dL (ref 8.4–10.5)
Chloride: 100 mEq/L (ref 96–112)
GFR, EST NON AFRICAN AMERICAN: 52 mL/min — AB
GFR, Est African American: 60 mL/min
Glucose, Bld: 92 mg/dL (ref 70–99)
Potassium: 4 mEq/L (ref 3.5–5.3)
Sodium: 139 mEq/L (ref 135–145)

## 2014-07-28 LAB — HEMOGLOBIN A1C
HEMOGLOBIN A1C: 5.8 % — AB (ref <5.7)
Mean Plasma Glucose: 120 mg/dL — ABNORMAL HIGH (ref <117)

## 2014-07-28 LAB — CBC WITH DIFFERENTIAL/PLATELET
Basophils Absolute: 0 10*3/uL (ref 0.0–0.1)
Basophils Relative: 0 % (ref 0–1)
EOS ABS: 0.2 10*3/uL (ref 0.0–0.7)
Eosinophils Relative: 3 % (ref 0–5)
HCT: 41.1 % (ref 39.0–52.0)
Hemoglobin: 13.6 g/dL (ref 13.0–17.0)
LYMPHS ABS: 1.3 10*3/uL (ref 0.7–4.0)
Lymphocytes Relative: 20 % (ref 12–46)
MCH: 30.1 pg (ref 26.0–34.0)
MCHC: 33.1 g/dL (ref 30.0–36.0)
MCV: 90.9 fL (ref 78.0–100.0)
MPV: 10.4 fL (ref 8.6–12.4)
Monocytes Absolute: 0.5 10*3/uL (ref 0.1–1.0)
Monocytes Relative: 7 % (ref 3–12)
NEUTROS ABS: 4.6 10*3/uL (ref 1.7–7.7)
Neutrophils Relative %: 70 % (ref 43–77)
Platelets: 214 10*3/uL (ref 150–400)
RBC: 4.52 MIL/uL (ref 4.22–5.81)
RDW: 14.4 % (ref 11.5–15.5)
WBC: 6.6 10*3/uL (ref 4.0–10.5)

## 2014-07-28 LAB — MAGNESIUM: Magnesium: 2 mg/dL (ref 1.5–2.5)

## 2014-07-28 LAB — HEPATIC FUNCTION PANEL
ALT: 18 U/L (ref 0–53)
AST: 18 U/L (ref 0–37)
Albumin: 3.7 g/dL (ref 3.5–5.2)
Alkaline Phosphatase: 76 U/L (ref 39–117)
BILIRUBIN DIRECT: 0.1 mg/dL (ref 0.0–0.3)
Indirect Bilirubin: 0.4 mg/dL (ref 0.2–1.2)
Total Bilirubin: 0.5 mg/dL (ref 0.2–1.2)
Total Protein: 5.9 g/dL — ABNORMAL LOW (ref 6.0–8.3)

## 2014-07-28 LAB — TSH: TSH: 2.547 u[IU]/mL (ref 0.350–4.500)

## 2014-07-28 LAB — VITAMIN D 25 HYDROXY (VIT D DEFICIENCY, FRACTURES): VIT D 25 HYDROXY: 41 ng/mL (ref 30–100)

## 2014-07-28 LAB — LIPID PANEL
CHOL/HDL RATIO: 4.1 ratio
Cholesterol: 172 mg/dL (ref 0–200)
HDL: 42 mg/dL (ref >=40)
LDL Cholesterol: 89 mg/dL (ref 0–99)
Triglycerides: 205 mg/dL — ABNORMAL HIGH (ref <150)
VLDL: 41 mg/dL — ABNORMAL HIGH (ref 0–40)

## 2014-07-28 LAB — INSULIN, RANDOM: Insulin: 10.4 u[IU]/mL (ref 2.0–19.6)

## 2014-08-26 DIAGNOSIS — C61 Malignant neoplasm of prostate: Secondary | ICD-10-CM | POA: Diagnosis not present

## 2014-10-01 ENCOUNTER — Telehealth: Payer: Self-pay | Admitting: *Deleted

## 2014-10-01 ENCOUNTER — Other Ambulatory Visit: Payer: Self-pay | Admitting: Internal Medicine

## 2014-10-01 DIAGNOSIS — R451 Restlessness and agitation: Secondary | ICD-10-CM

## 2014-10-01 MED ORDER — QUETIAPINE FUMARATE 50 MG PO TABS: ORAL_TABLET | ORAL | Status: DC

## 2014-10-01 NOTE — Telephone Encounter (Signed)
Pts Daughter called said having trouble with her Dad again, said  Pt

## 2014-10-01 NOTE — Telephone Encounter (Signed)
Pts Daughter called said pt is having trouble with her Dad again. She is asking for advise on her Dad. Said that the dementia is getting worse, pt has gotten lost several times with driving.  She talked pt into taking his car to her house, but now pt is mad & wanting to call the cops says that someone has stole it ect  shes just lost as what to do?  Per Dr Crissie Sickles  Called her back & per Dr Crissie Sickles willing to call ing a rx for dementia & have pt to come in the office in 2wks for a follow up. Pt is scheduled for 10/16/14 with Dr Melford Aase pts Daughter request that we call rx to [facility] [address]

## 2014-10-07 ENCOUNTER — Ambulatory Visit (INDEPENDENT_AMBULATORY_CARE_PROVIDER_SITE_OTHER): Payer: Medicare Other | Admitting: Internal Medicine

## 2014-10-07 VITALS — BP 110/64 | HR 60 | Temp 97.4°F | Resp 16 | Ht 67.5 in | Wt 157.2 lb

## 2014-10-07 DIAGNOSIS — G308 Other Alzheimer's disease: Secondary | ICD-10-CM | POA: Diagnosis not present

## 2014-10-07 DIAGNOSIS — F0280 Dementia in other diseases classified elsewhere without behavioral disturbance: Secondary | ICD-10-CM | POA: Insufficient documentation

## 2014-10-07 DIAGNOSIS — G301 Alzheimer's disease with late onset: Principal | ICD-10-CM

## 2014-10-07 NOTE — Progress Notes (Signed)
   Subjective:    Patient ID: [name] [name], male    DOB: [birth year], 81 y.o.   MRN: [id number]  HPI Patient is a very nice 81 yo [place] living alone since widowed in Nov 2015 and struggling with Depression was brought  in  to by his daughter because of memory issues with him getting lost on at least 4-5 occasions in his car that are known about. He also has had problems with paying bills. Daughter took his car keys and moved his car to her house. She reports that he has become frequently upset agitated and verbally abusive with enuendos by buying a new car, etc.   Medication Sig  . aspirin EC 81 MG tablet Take 81 mg by mouth daily.  Marland Kitchen VITAMIN D 1000 UNITS  Take 1,000 Units by mouth 2 (two) times daily.  . citalopram (CELEXA) 20 MG tablet Take 1&1/2 tablets (= 30 mg) Daily for mood and depression  . levothyroxine  75 MCG tablet take 1 tablet by mouth once daily  . losartan-hctz (HYZAAR) 100-12.5 MG per tablet Take 1 tablet by mouth every morning.  Marland Kitchen QUEtiapine (SEROQUEL) 50 MG tablet Take 1 tablet 2 x daily  . solifenacin (VESICARE) 10 MG tablet Take by mouth daily.  . verapamil SR 240 MG CR t take 1 capsule by mouth once daily   Allergies  Allergen Reactions  . Penicillins    Past Medical History  Diagnosis Date  . HTN (hypertension)   . Vitamin D deficiency   . Prostate cancer   . Hypothyroid   . Prediabetes   . Other testicular hypofunction   . Depression    Past Surgical History  Procedure Laterality Date  . Biopsy prostate  2/99    pos for adenocarcinoma of prostate  . Cystourethroscopy  7/99    and TUR of bladder cancer  . Prostate surgery  2007   Review of Systems  10 point systems review negative except as above.    Objective:   Physical Exam   BP 110/64 mmHg  Pulse 60  Temp(Src) 97.4 F (36.3 C)  Resp 16  Ht 5' 7.5" (1.715 m)  Wt 157 lb 3.2 oz (71.305 kg)  BMI 24.24 kg/m2   HEENT - Eac's patent. TM's Nl. EOM's full. PERRLA. NasoOroPharynx clear. Neck -  supple. Nl Thyroid. Carotids 2+ & No bruits, nodes, JVD Chest - Clear equal BS w/o Rales, rhonchi, wheezes. Cor - Nl HS. RRR w/o sig MGR. PP 1(+). No edema. Abd - No palpable organomegaly, masses or tenderness. BS nl. MS- FROM w/o deformities. Muscle power, tone and bulk Nl. Gait Nl. Neuro - No obvious Cr N abnormalities. Sensory, motor and Cerebellar functions appear Nl w/o focal abnormalities. Psyche - Mental status normal & appropriate.  No delusions, ideations or obvious mood abnormalities.  Patient scored 28/30 on the MMSE and did reasonably well with the Clock Draw.  Nonetheless he was felt to have moderate difficulty with ST recall.     Assessment & Plan:   1. 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   - ROV 1 Month to reassess an monitor weight loss

## 2014-10-08 ENCOUNTER — Encounter: Payer: Self-pay | Admitting: Internal Medicine

## 2014-10-15 ENCOUNTER — Ambulatory Visit: Payer: Self-pay | Admitting: Internal Medicine

## 2014-10-16 ENCOUNTER — Ambulatory Visit: Payer: Self-pay | Admitting: Internal Medicine

## 2014-10-27 DIAGNOSIS — C679 Malignant neoplasm of bladder, unspecified: Secondary | ICD-10-CM | POA: Diagnosis not present

## 2014-10-27 DIAGNOSIS — C61 Malignant neoplasm of prostate: Secondary | ICD-10-CM | POA: Diagnosis not present

## 2014-11-04 ENCOUNTER — Ambulatory Visit (INDEPENDENT_AMBULATORY_CARE_PROVIDER_SITE_OTHER): Payer: Medicare Other | Admitting: Internal Medicine

## 2014-11-04 ENCOUNTER — Encounter: Payer: Self-pay | Admitting: Internal Medicine

## 2014-11-04 VITALS — BP 142/64 | HR 62 | Temp 98.2°F | Resp 16 | Ht 67.5 in | Wt 158.0 lb

## 2014-11-04 DIAGNOSIS — I10 Essential (primary) hypertension: Secondary | ICD-10-CM

## 2014-11-04 DIAGNOSIS — F0280 Dementia in other diseases classified elsewhere without behavioral disturbance: Secondary | ICD-10-CM

## 2014-11-04 DIAGNOSIS — E039 Hypothyroidism, unspecified: Secondary | ICD-10-CM | POA: Diagnosis not present

## 2014-11-04 DIAGNOSIS — G301 Alzheimer's disease with late onset: Secondary | ICD-10-CM

## 2014-11-04 DIAGNOSIS — Z6824 Body mass index (BMI) 24.0-24.9, adult: Secondary | ICD-10-CM

## 2014-11-04 DIAGNOSIS — G308 Other Alzheimer's disease: Secondary | ICD-10-CM

## 2014-11-04 DIAGNOSIS — Z79899 Other long term (current) drug therapy: Secondary | ICD-10-CM | POA: Diagnosis not present

## 2014-11-04 DIAGNOSIS — R7303 Prediabetes: Secondary | ICD-10-CM

## 2014-11-04 DIAGNOSIS — E785 Hyperlipidemia, unspecified: Secondary | ICD-10-CM | POA: Diagnosis not present

## 2014-11-04 DIAGNOSIS — E559 Vitamin D deficiency, unspecified: Secondary | ICD-10-CM | POA: Diagnosis not present

## 2014-11-04 DIAGNOSIS — R7309 Other abnormal glucose: Secondary | ICD-10-CM

## 2014-11-04 LAB — CBC WITH DIFFERENTIAL/PLATELET
Basophils Absolute: 0 10*3/uL (ref 0.0–0.1)
Basophils Relative: 0 % (ref 0–1)
Eosinophils Absolute: 0.2 10*3/uL (ref 0.0–0.7)
Eosinophils Relative: 3 % (ref 0–5)
HEMATOCRIT: 40.5 % (ref 39.0–52.0)
Hemoglobin: 13.9 g/dL (ref 13.0–17.0)
LYMPHS ABS: 0.8 10*3/uL (ref 0.7–4.0)
Lymphocytes Relative: 15 % (ref 12–46)
MCH: 30.9 pg (ref 26.0–34.0)
MCHC: 34.3 g/dL (ref 30.0–36.0)
MCV: 90 fL (ref 78.0–100.0)
MONOS PCT: 6 % (ref 3–12)
MPV: 10.3 fL (ref 8.6–12.4)
Monocytes Absolute: 0.3 10*3/uL (ref 0.1–1.0)
NEUTROS PCT: 76 % (ref 43–77)
Neutro Abs: 4.3 10*3/uL (ref 1.7–7.7)
Platelets: 174 10*3/uL (ref 150–400)
RBC: 4.5 MIL/uL (ref 4.22–5.81)
RDW: 14.2 % (ref 11.5–15.5)
WBC: 5.6 10*3/uL (ref 4.0–10.5)

## 2014-11-04 LAB — BASIC METABOLIC PANEL WITH GFR
BUN: 22 mg/dL (ref 7–25)
CHLORIDE: 103 mmol/L (ref 98–110)
CO2: 25 mmol/L (ref 20–31)
Calcium: 10 mg/dL (ref 8.6–10.3)
Creat: 1.26 mg/dL — ABNORMAL HIGH (ref 0.70–1.11)
GFR, Est African American: 60 mL/min (ref >=60)
GFR, Est Non African American: 52 mL/min — ABNORMAL LOW (ref >=60)
Glucose, Bld: 105 mg/dL — ABNORMAL HIGH (ref 65–99)
Potassium: 3.6 mmol/L (ref 3.5–5.3)
Sodium: 139 mmol/L (ref 135–146)

## 2014-11-04 LAB — TSH: TSH: 2.715 u[IU]/mL (ref 0.350–4.500)

## 2014-11-04 LAB — LIPID PANEL
CHOL/HDL RATIO: 4.3 ratio (ref <=5.0)
CHOLESTEROL: 204 mg/dL — AB (ref 125–200)
HDL: 47 mg/dL (ref >=40)
LDL CALC: 131 mg/dL — AB (ref <130)
Triglycerides: 128 mg/dL (ref <150)
VLDL: 26 mg/dL (ref <30)

## 2014-11-04 LAB — HEPATIC FUNCTION PANEL
ALBUMIN: 4.4 g/dL (ref 3.6–5.1)
ALT: 16 U/L (ref 9–46)
AST: 18 U/L (ref 10–35)
Alkaline Phosphatase: 83 U/L (ref 40–115)
BILIRUBIN DIRECT: 0.2 mg/dL (ref <=0.2)
Indirect Bilirubin: 0.6 mg/dL (ref 0.2–1.2)
Total Bilirubin: 0.8 mg/dL (ref 0.2–1.2)
Total Protein: 6.3 g/dL (ref 6.1–8.1)

## 2014-11-04 LAB — HEMOGLOBIN A1C
HEMOGLOBIN A1C: 5.6 % (ref <5.7)
MEAN PLASMA GLUCOSE: 114 mg/dL (ref <117)

## 2014-11-04 LAB — MAGNESIUM: Magnesium: 1.9 mg/dL (ref 1.5–2.5)

## 2014-11-04 NOTE — Progress Notes (Signed)
Patient ID: [name] [name], male   DOB: [birth year], 81 y.o.   MRN: [id number]   This very nice 81 y.o.  WWM presents for quarterly follow up with Hypertension, Hyperlipidemia, Pre-Diabetes and Vitamin D Deficiency. Patient has mild SDAT and had a visit with daughter about a month ago for evaluation with MMSE 28/30, but definitely has been having issues/instances when he has gotten lost in his car and also forgotten to pay bills, etc. He reluctantly agreed to relinquish his car and general POA to his daughter Dwaine Gale). Daughter reports he functions OK in his home wrt ADL's.   Patient is treated for HTN & BP has been controlled at home. Today's BP: (!) 142/64 mmHg. Patient has Stage 3 CKD consequent of his HTCVD.  Patient has had no complaints of any cardiac type chest pain, palpitations, dyspnea/orthopnea/PND, dizziness, claudication, or dependent edema.   Hyperlipidemia is controlled with diet & meds. Patient denies myalgias or other med SE's. Last Lipids were at goal - Cholesterol 172; HDL 42; LDL 89; and elevated Triglycerides 205 on 07/27/2014.   Also, the patient has history of PreDiabetes since 2011 with A1c 5.9% and has had no symptoms of reactive hypoglycemia, diabetic polys, paresthesias or visual blurring.  Last A1c was 5.8% on 07/27/2014.   Patient has been stable on thyroid replacement for years. Further, the patient also has history of Vitamin D Deficiency of 25 in 2008  and supplements vitamin D without any suspected side-effects. Last vitamin D was 41 on 07/27/2014.       Medication Sig  . aspirin EC 81 MG  Take 81 mg by mouth daily.  Marland Kitchen VITAMIN D 1000 UNITS  Take 1,000 Units by mouth 2 (two) times daily.  . citalopram  20 MG  Take 1&1/2 tablets (= 30 mg) Daily for mood and depression  . levothyroxine  75 MCG  take 1 tablet by mouth once daily  . losartan-hctz (HYZAAR) 100-12.5  Take 1 tablet by mouth every morning.  Marland Kitchen QUEtiapine (SEROQUEL) 50 MG Take 1 tablet 2 x daily  .  solifenacin (VESICARE) 10 MG  Take by mouth daily.  . verapamil -SR 240 MG CR  take 1 capsule by mouth once daily   Allergies  Allergen Reactions  . Penicillins    PMHx:   Past Medical History  Diagnosis Date  . HTN (hypertension)   . Vitamin D deficiency   . Prostate cancer   . Hypothyroid   . Prediabetes   . Other testicular hypofunction   . Depression    Immunization History  Administered Date(s) Administered  . Influenza Split 12/18/2012  . Pneumococcal Polysaccharide-23 03/06/2006  . Td 03/06/2004   Past Surgical History  Procedure Laterality Date  . Biopsy prostate  2/99    pos for adenocarcinoma of prostate  . Cystourethroscopy  7/99    and TUR of bladder cancer  . Prostate surgery  2007   FHx:    Reviewed / unchanged  SHx:    Reviewed / unchanged  Systems Review:  Constitutional: Denies fever, chills, wt changes, headaches, insomnia, fatigue, night sweats, change in appetite. Eyes: Denies redness, blurred vision, diplopia, discharge, itchy, watery eyes.  ENT: Denies discharge, congestion, post nasal drip, epistaxis, sore throat, earache, hearing loss, dental pain, tinnitus, vertigo, sinus pain, snoring.  CV: Denies chest pain, palpitations, irregular heartbeat, syncope, dyspnea, diaphoresis, orthopnea, PND, claudication or edema. Respiratory: denies cough, dyspnea, DOE, pleurisy, hoarseness, laryngitis, wheezing.  Gastrointestinal: Denies dysphagia, odynophagia, heartburn, reflux, water brash,  abdominal pain or cramps, nausea, vomiting, bloating, diarrhea, constipation, hematemesis, melena, hematochezia  or hemorrhoids. Genitourinary: Denies dysuria, frequency, urgency, nocturia, hesitancy, discharge, hematuria or flank pain. Musculoskeletal: Denies arthralgias, myalgias, stiffness, jt. swelling, pain, limping or strain/sprain.  Skin: Denies pruritus, rash, hives, warts, acne, eczema or change in skin lesion(s). Neuro: No weakness, tremor, incoordination, spasms,  paresthesia or pain. Psychiatric: has had issues with confusion, memory loss, but not sensory loss. Endo: Denies change in weight, skin or hair change.  Heme/Lymph: No excessive bleeding, bruising or enlarged lymph nodes.  Physical Exam  BP 142/64 mmHg  Pulse 62  Temp(Src) 98.2 F (36.8 C) (Temporal)  Resp 16  Ht 5' 7.5" (1.715 m)  Wt 158 lb (71.668 kg)  BMI 24.37 kg/m2  Appears well nourished and in no distress. Eyes: PERRLA, EOMs, conjunctiva no swelling or erythema. Sinuses: No frontal/maxillary tenderness ENT/Mouth: EAC's clear, TM's nl w/o erythema, bulging. Nares clear w/o erythema, swelling, exudates. Oropharynx clear without erythema or exudates. Oral hygiene is good. Tongue normal, non obstructing. Hearing intact.  Neck: Supple. Thyroid nl. Car 2+/2+ without bruits, nodes or JVD. Chest: Respirations nl with BS clear & equal w/o rales, rhonchi, wheezing or stridor.  Cor: Heart sounds normal w/ regular rate and rhythm without sig. murmurs, gallops, clicks, or rubs. Peripheral pulses normal and equal  without edema.  Abdomen: Soft & bowel sounds normal. Non-tender w/o guarding, rebound, hernias, masses, or organomegaly.  Lymphatics: Unremarkable.  Musculoskeletal: Full ROM all peripheral extremities, joint stability, 5/5 strength, and normal gait.  Skin: Warm, dry without exposed rashes, lesions or ecchymosis apparent.  Neuro: Cranial nerves intact, reflexes equal bilaterally. Sensory-motor testing grossly intact. Tendon reflexes grossly intact. (+) Snout & Palmo-mental response. Pysch: Alert & oriented x 3.  Insight and judgement nl & appropriate. No ideations.  Assessment and Plan:  1. Essential hypertension  - TSH  2. Hyperlipidemia  - Lipid panel  3. Prediabetes  - Hemoglobin A1c - Insulin, random  4. Vitamin D deficiency  - Vit D  25 hydroxy   5. Hypothyroidism   6. SDAT    7. Encounter for long-term (current) use of medications  - CBC with  Differential/Platelet - BASIC METABOLIC PANEL WITH GFR - Hepatic function panel - Magnesium  8. Body mass index (BMI) of 24.0-24.9 in adult   Recommended regular exercise, BP monitoring, weight control, and discussed med and SE's. Recommended labs to assess and monitor clinical status. Further disposition pending results of labs. Over 30 minutes of exam, counseling, chart review was performed

## 2014-11-04 NOTE — Patient Instructions (Signed)

## 2014-11-05 LAB — INSULIN, RANDOM: Insulin: 44.1 u[IU]/mL — ABNORMAL HIGH (ref 2.0–19.6)

## 2014-11-05 LAB — VITAMIN D 25 HYDROXY (VIT D DEFICIENCY, FRACTURES): Vit D, 25-Hydroxy: 45 ng/mL (ref 30–100)

## 2014-11-18 ENCOUNTER — Other Ambulatory Visit: Payer: Self-pay | Admitting: Internal Medicine

## 2014-11-18 ENCOUNTER — Other Ambulatory Visit: Payer: Self-pay | Admitting: Physician Assistant

## 2015-01-10 ENCOUNTER — Other Ambulatory Visit: Payer: Self-pay | Admitting: Physician Assistant

## 2015-02-22 ENCOUNTER — Encounter: Payer: Self-pay | Admitting: Internal Medicine

## 2015-02-22 ENCOUNTER — Ambulatory Visit (INDEPENDENT_AMBULATORY_CARE_PROVIDER_SITE_OTHER): Payer: Medicare Other | Admitting: Internal Medicine

## 2015-02-22 VITALS — BP 118/62 | HR 72 | Temp 97.3°F | Resp 16 | Ht 67.5 in | Wt 159.2 lb

## 2015-02-22 DIAGNOSIS — E291 Testicular hypofunction: Secondary | ICD-10-CM

## 2015-02-22 DIAGNOSIS — F325 Major depressive disorder, single episode, in full remission: Secondary | ICD-10-CM | POA: Insufficient documentation

## 2015-02-22 DIAGNOSIS — F32A Depression, unspecified: Secondary | ICD-10-CM

## 2015-02-22 DIAGNOSIS — Z0000 Encounter for general adult medical examination without abnormal findings: Secondary | ICD-10-CM

## 2015-02-22 DIAGNOSIS — N320 Bladder-neck obstruction: Secondary | ICD-10-CM | POA: Diagnosis not present

## 2015-02-22 DIAGNOSIS — F329 Major depressive disorder, single episode, unspecified: Secondary | ICD-10-CM | POA: Diagnosis not present

## 2015-02-22 DIAGNOSIS — F0280 Dementia in other diseases classified elsewhere without behavioral disturbance: Secondary | ICD-10-CM

## 2015-02-22 DIAGNOSIS — Z1389 Encounter for screening for other disorder: Secondary | ICD-10-CM | POA: Diagnosis not present

## 2015-02-22 DIAGNOSIS — I4510 Unspecified right bundle-branch block: Secondary | ICD-10-CM

## 2015-02-22 DIAGNOSIS — R739 Hyperglycemia, unspecified: Secondary | ICD-10-CM | POA: Diagnosis not present

## 2015-02-22 DIAGNOSIS — E785 Hyperlipidemia, unspecified: Secondary | ICD-10-CM

## 2015-02-22 DIAGNOSIS — R7303 Prediabetes: Secondary | ICD-10-CM | POA: Diagnosis not present

## 2015-02-22 DIAGNOSIS — Z0001 Encounter for general adult medical examination with abnormal findings: Secondary | ICD-10-CM

## 2015-02-22 DIAGNOSIS — Z1331 Encounter for screening for depression: Secondary | ICD-10-CM

## 2015-02-22 DIAGNOSIS — E559 Vitamin D deficiency, unspecified: Secondary | ICD-10-CM | POA: Diagnosis not present

## 2015-02-22 DIAGNOSIS — Z789 Other specified health status: Secondary | ICD-10-CM

## 2015-02-22 DIAGNOSIS — G308 Other Alzheimer's disease: Secondary | ICD-10-CM

## 2015-02-22 DIAGNOSIS — Z125 Encounter for screening for malignant neoplasm of prostate: Secondary | ICD-10-CM | POA: Diagnosis not present

## 2015-02-22 DIAGNOSIS — I10 Essential (primary) hypertension: Secondary | ICD-10-CM

## 2015-02-22 DIAGNOSIS — Z23 Encounter for immunization: Secondary | ICD-10-CM

## 2015-02-22 DIAGNOSIS — E039 Hypothyroidism, unspecified: Secondary | ICD-10-CM | POA: Diagnosis not present

## 2015-02-22 DIAGNOSIS — G301 Alzheimer's disease with late onset: Secondary | ICD-10-CM

## 2015-02-22 DIAGNOSIS — E349 Endocrine disorder, unspecified: Secondary | ICD-10-CM

## 2015-02-22 DIAGNOSIS — Z1212 Encounter for screening for malignant neoplasm of rectum: Secondary | ICD-10-CM

## 2015-02-22 DIAGNOSIS — Z79899 Other long term (current) drug therapy: Secondary | ICD-10-CM

## 2015-02-22 DIAGNOSIS — R7309 Other abnormal glucose: Secondary | ICD-10-CM | POA: Diagnosis not present

## 2015-02-22 DIAGNOSIS — R6889 Other general symptoms and signs: Secondary | ICD-10-CM

## 2015-02-22 DIAGNOSIS — Z9181 History of falling: Secondary | ICD-10-CM

## 2015-02-22 NOTE — Progress Notes (Signed)
Patient ID: [name] [name], male   DOB: [birth year], 81 y.o.   MRN: [id number]  Medicare Annual Wellness Visit and  Comprehensive Evaluation & Examination    Assessment:   1. Essential hypertension  - Microalbumin / creatinine urine ratio - EKG 12-Lead - [place], RETROPERITNL ABD,  LTD - TSH  2. Hyperlipidemia  - Lipid panel - TSH  3. Prediabetes  - Hemoglobin A1c - Insulin, random  4. Vitamin D deficiency  - VITAMIN D 25 Hydroxy   5. Hypothyroidism   6. RBBB   7. Testosterone deficiency   8. SDAT (senile dementia of Alzheimer's type)   9. Depression, controlled   10. Screening for rectal cancer  - POC Hemoccult Bld/Stl   11. Prostate cancer screening  - PSA  12. Depression screen   13. At low risk for fall   14. Blood glucose elevated  - Hemoglobin A1c - Insulin, random  15. Bladder neck obstruction  - PSA  16. Need for prophylactic vaccination and inoculation against influenza  - Flu vaccine HIGH DOSE PF (Fluzone High dose)  17. Need for prophylactic vaccination against Streptococcus pneumoniae (pneumococcus)  - Pneumococcal conjugate vaccine 13-valent  18. Medicare annual wellness visit, subsequent   19. Medication management  - Urinalysis, Routine w reflex microscopic  - CBC with Differential/Platelet - BASIC METABOLIC PANEL WITH GFR - Hepatic function panel - Magnesium  Plan:   During the course of the visit the patient was educated and counseled about appropriate screening and preventive services including:    Pneumococcal vaccine   Influenza vaccine  Td vaccine  Screening electrocardiogram  Bone densitometry screening  Colorectal cancer screening  Diabetes screening  Glaucoma screening  Nutrition counseling   Advanced directives: requested  Screening recommendations, referrals: Vaccinations: Immunization History  Administered Date(s) Administered  . Influenza Split 12/18/2012  . Influenza, High Dose  Seasonal PF 02/22/2015  . Pneumococcal Conjugate-13 02/22/2015  . Pneumococcal Polysaccharide-23 03/06/2006  . Td 03/06/2004 and declines a booster  Shingles vaccine declined Hep B vaccine not indicated  Nutrition assessed and recommended  Colonoscopy 1998 and declines recommended f/u Recommended yearly ophthalmology/optometry visit for glaucoma screening and checkup Recommended yearly dental visit for hygiene and checkup Advanced directives - Yes  Conditions/risks identified: BMI: Discussed weight loss, diet, and increase physical activity.  Increase physical activity: AHA recommends 150 minutes of physical activity a week.  Medications reviewed PreDiabetes is at goal, ACE/ARB therapy: Yes. Urinary Incontinence is not an issue: discussed non pharmacology and pharmacology options.  Fall risk: low- discussed PT, home fall assessment, medications.   Subjective:      [name] [name]  presents for [employer] Visit and presents for a comprehensive evaluation, examination and management of multiple medical co-morbidities.  Date of last medicare wellness visit is unknown.  This very nice 81 y.o. [name] presents also for  follow up with Hypertension, Hyperlipidemia, Depression,  Pre-Diabetes and Vitamin D Deficiency.      Pt's has long hx/o Depression and also was widowed about a year ago and feels some better on his Citalopram. He also is felt to have SDAT or vascular Dementia and has hx/o getting lost driving , so his daughter drives him for appointments and also assists with managing his finances.      Patient is also followed by Dr Gaynelle Arabian for hx/o bladder and prostate cancer felt to be in remission     Patient is treated for HTN since 1982 & BP has been controlled at home. Today's  BP: 118/62 mmHg. Patient has had no complaints of any cardiac type chest pain, palpitations, dyspnea/orthopnea/PND, dizziness, claudication, or dependent edema.     Hyperlipidemia is controlled  with diet & meds. Patient denies myalgias or other med SE's. Last Lipids were not at goal with Cholesterol 204*; HDL 47; LDL 131*; Triglycerides 128 on 11/04/2014.     Also, the patient has history of PreDiabetes since 2011 with A1c 5.7% and has had no symptoms of reactive hypoglycemia, diabetic polys, paresthesias or visual blurring.  Last A1c was 5.6% on  11/04/2014.       Further, the patient also has history of Vitamin D Deficiency of 25 in 2008 and supplements vitamin D without any suspected side-effects. Last vitamin D was  45 on 11/04/2014.  Names of Other Physician/Practitioners you currently use: 1. [facility] here for primary care 2. ? Eye Dr , eye doctor, last visit > 3 years  And encouraged patient & his daughter to schedule an eye exam. 3. No dentist, has Dentures  Patient Care Team: Unk Pinto, MD as PCP - General (Internal Medicine) Carolan Clines, MD as Consulting Physician (Urology) Josue Hector, MD as Consulting Physician (Cardiology) Elam Dutch, MD as Consulting Physician (Vascular Surgery)  Medication Review: Medication Sig  . aspirin EC 81 MG tablet Take 81 mg by mouth daily.  . cholecalciferol (VITAMIN D) 1000 UNITS tablet Take 1,000 Units by mouth 2 (two) times daily.  Marland Kitchen levothyroxine  75 MCG tablet take 1 tablet by mouth once daily  . losartan-hctz 100-12.5 MG tablet take 1 tablet by mouth every morning  . QUEtiapine (SEROQUEL) 50 MG tablet take 1 tablet by mouth twice a day  . solifenacin (VESICARE) 10 MG tablet Take by mouth daily.  . verapamil (CALAN-SR) 240 MG CR tablet take 1 capsule by mouth once daily  . citalopram (CELEXA) 20 MG tablet Take 1&1/2 tablets (= 30 mg) Daily for mood and depression   Allergies  Allergen Reactions  . Penicillins    Current Problems (verified) Patient Active Problem List   Diagnosis Date Noted  . Depression, controlled 02/22/2015  . SDAT (senile dementia of Alzheimer's  type) 10/07/2014  . Overactive bladder 03/10/2014  . Hyperlipidemia 04/02/2013  . Medication management 04/02/2013  . Hypothyroid   . Vitamin D deficiency   . Prediabetes   . Testosterone deficiency   . RBBB 12/25/2012  . HTN (hypertension) 12/25/2012    Screening Tests Health Maintenance  Topic Date Due  . ZOSTAVAX  08/03/1989  . PNA vac Low Risk Adult (2 of 2 - PCV13) 03/07/2007  . TETANUS/TDAP  03/06/2014  . INFLUENZA VACCINE  10/05/2014    Immunization History  Administered Date(s) Administered  . Influenza Split 12/18/2012  . Influenza, High Dose Seasonal PF 02/22/2015  . Pneumococcal Conjugate-13 02/22/2015  . Pneumococcal Polysaccharide-23 03/06/2006  . Td 03/06/2004   Preventative care: Last colonoscopy: 1998 and refused f/u.  Past Medical History  Diagnosis Date  . HTN (hypertension)   . Vitamin D deficiency   . Prostate cancer ([place])   . Hypothyroid   . Prediabetes   . Other testicular hypofunction   . Depression    Past Surgical History  Procedure Laterality Date  . Biopsy prostate  2/99    pos for adenocarcinoma of prostate  . Cystourethroscopy  7/99    and TUR of bladder cancer  . Prostate surgery  2007   Risk Factors: Tobacco Social History  Substance Use Topics  .  Smoking status: Former Smoker    Types: Cigarettes    Quit date: 12/25/1996  . Smokeless tobacco: Never Used  . Alcohol Use: No   He does not smoke.  Patient is a former smoker. Are there smokers in your home (other than you)?  No  Alcohol Current alcohol use: none  Caffeine Current caffeine use: coffee 1 to 2 cups /day  Exercise Current exercise: walking and yard work  Nutrition/Diet Current diet: in general, a "healthy" diet    Cardiac risk factors: advanced age (older than 61 for men, 39 for women), dyslipidemia, hypertension, male gender, sedentary lifestyle and smoking/ tobacco exposure.  Depression Screen (Note: if answer to either of the following is "Yes", a  more complete depression screening is indicated)   Q1: Over the past two weeks, have you felt down, depressed or hopeless?sometimes  Q2: Over the past two weeks, have you felt little interest or pleasure in doing things? No  Have you lost interest or pleasure in daily life? sometimes  Do you often feel hopeless? sometimes  Do you cry easily over simple problems? No  Activities of Daily Living In your present state of health, do you have any difficulty performing the following activities?:  Driving? Yes - daughter drives Managing money?  Yes - daughter assists Feeding yourself? No Getting from bed to chair? No Climbing a flight of stairs? No Preparing food and eating?: No Bathing or showering? No Getting dressed: No Getting to the toilet? No Using the toilet:No Moving around from place to place: No In the past year have you fallen or had a near fall?:No   Are you sexually active?  No  Do you have more than one partner?  No  Vision Difficulties: No  Hearing Difficulties: No Do you often ask people to speak up or repeat themselves? yes Do you experience ringing or noises in your ears? No Do you have difficulty understanding soft or whispered voices?yes  Cognition  Do you feel that you have a problem with memory?yes  Do you often misplace items? yes  Do you feel safe at home?  Yes  Advanced directives Does patient have a [place]? Yes Does patient have a Living Will? Yes  ROS: Constitutional: Denies fever, chills, weight loss/gain, headaches, insomnia, fatigue, night sweats or change in appetite. Eyes: Denies redness, blurred vision, diplopia, discharge, itchy or watery eyes.  ENT: Denies discharge, congestion, post nasal drip, epistaxis, sore throat, earache, hearing loss, dental pain, Tinnitus, Vertigo, Sinus pain or snoring.  Cardio: Denies chest pain, palpitations, irregular heartbeat, syncope, dyspnea, diaphoresis, orthopnea, PND, claudication or  edema Respiratory: denies cough, dyspnea, DOE, pleurisy, hoarseness, laryngitis or wheezing.  Gastrointestinal: Denies dysphagia, heartburn, reflux, water brash, pain, cramps, nausea, vomiting, bloating, diarrhea, constipation, hematemesis, melena, hematochezia, jaundice or hemorrhoids Genitourinary: Denies dysuria, frequency, urgency, nocturia, hesitancy, discharge, hematuria or flank pain Musculoskeletal: Denies arthralgia, myalgia, stiffness, Jt. Swelling, pain, limp or strain/sprain. Denies Falls. Skin: Denies puritis, rash, hives, warts, acne, eczema or change in skin lesion Neuro: No weakness, tremor, incoordination, spasms, paresthesia or pain Psychiatric: Denies confusion, memory loss or sensory loss. Denies Depression. Endocrine: Denies change in weight, skin, hair change, nocturia, and paresthesia, diabetic polys, visual blurring or hyper / hypo glycemic episodes.  Heme/Lymph: No excessive bleeding, bruising or enlarged lymph nodes.  Objective:     BP 118/62 mmHg  Pulse 72  Temp(Src) 97.3 F (36.3 C)  Resp 16  Ht 5' 7.5" (1.715 m)  Wt 159 lb  3.2 oz (72.213 kg)  BMI 24.55 kg/m2  General Appearance:  Alert  WD/WN, male  in no apparent distress. Eyes: PERRLA, EOMs nl, conjunctiva normal, normal fundi and vessels. Sinuses: No frontal/maxillary tenderness ENT/Mouth: EACs patent / TMs  nl. Nares clear without erythema, swelling, mucoid exudates. Oral hygiene is good. No erythema, swelling, or exudate. Tongue normal, non-obstructing. Tonsils not swollen or erythematous. Hearing normal.  Neck: Supple, thyroid normal. No bruits, nodes or JVD. Respiratory: Respiratory effort normal.  BS equal and clear bilateral without rales, rhonci, wheezing or stridor. Cardio: Heart sounds are normal with regular rate and rhythm and no murmurs, rubs or gallops. Peripheral pulses are normal and equal bilaterally without edema. No aortic or femoral bruits. Chest: symmetric with normal excursions and  percussion.  Abdomen: Flat, soft with nl bowel sounds. Nontender, no guarding, rebound, hernias, masses, or organomegaly.  Lymphatics: Non tender without lymphadenopathy.  Genitourinary: No hernias.Testes nl. DRE - prostate nl for age - smooth & firm w/o nodules. Musculoskeletal: Full ROM all peripheral extremities, joint stability, 5/5 strength, and normal gait. Skin: Warm and dry without rashes, lesions, cyanosis, clubbing or  ecchymosis.  Neuro: Cranial nerves intact, reflexes equal bilaterally. Normal muscle tone, no cerebellar symptoms. Sensation intact.  Pysch: Alert and oriented X 3 with normal affect, insight and judgment appropriate.   Cognitive Testing  Alert? Yes  Normal Appearance? Yes  Oriented to person? Yes  Place? Yes   Time? Yes  Recall of three objects?  Yes  Can perform simple calculations? Yes  Displays appropriate judgment? Yes  Can read the correct time from a watch/clock? Yes  Medicare Attestation I have personally reviewed: The patient's medical and social history Their use of alcohol, tobacco or illicit drugs Their current medications and supplements The patient's functional ability including ADLs,fall risks, home safety risks, cognitive, and hearing and visual impairment Diet and physical activities Evidence for depression or mood disorders  The patient's weight, height, BMI, and visual acuity have been recorded in the chart.  I have made referrals, counseling, and provided education to the patient based on review of the above and I have provided the patient with a written personalized care plan for preventive services.  Over 40 minutes of exam, counseling, chart review was performed.  Malayah Demuro DAVID, MD   02/22/2015

## 2015-02-22 NOTE — Patient Instructions (Signed)

## 2015-02-23 LAB — CBC WITH DIFFERENTIAL/PLATELET
BASOS ABS: 0 10*3/uL (ref 0.0–0.1)
BASOS PCT: 0 % (ref 0–1)
EOS ABS: 0.2 10*3/uL (ref 0.0–0.7)
EOS PCT: 3 % (ref 0–5)
HCT: 44.3 % (ref 39.0–52.0)
Hemoglobin: 14.5 g/dL (ref 13.0–17.0)
Lymphocytes Relative: 23 % (ref 12–46)
Lymphs Abs: 1.4 10*3/uL (ref 0.7–4.0)
MCH: 29.3 pg (ref 26.0–34.0)
MCHC: 32.7 g/dL (ref 30.0–36.0)
MCV: 89.5 fL (ref 78.0–100.0)
MONOS PCT: 6 % (ref 3–12)
MPV: 11 fL (ref 8.6–12.4)
Monocytes Absolute: 0.4 10*3/uL (ref 0.1–1.0)
NEUTROS PCT: 68 % (ref 43–77)
Neutro Abs: 4.1 10*3/uL (ref 1.7–7.7)
PLATELETS: 190 10*3/uL (ref 150–400)
RBC: 4.95 MIL/uL (ref 4.22–5.81)
RDW: 14.8 % (ref 11.5–15.5)
WBC: 6.1 10*3/uL (ref 4.0–10.5)

## 2015-02-23 LAB — URINALYSIS, ROUTINE W REFLEX MICROSCOPIC
Bilirubin Urine: NEGATIVE
Glucose, UA: NEGATIVE
HGB URINE DIPSTICK: NEGATIVE
LEUKOCYTES UA: NEGATIVE
Nitrite: NEGATIVE
PROTEIN: NEGATIVE
Specific Gravity, Urine: 1.023 (ref 1.001–1.035)
pH: 5.5 (ref 5.0–8.0)

## 2015-02-23 LAB — BASIC METABOLIC PANEL WITH GFR
BUN: 22 mg/dL (ref 7–25)
CALCIUM: 9.8 mg/dL (ref 8.6–10.3)
CHLORIDE: 100 mmol/L (ref 98–110)
CO2: 25 mmol/L (ref 20–31)
CREATININE: 1.26 mg/dL — AB (ref 0.70–1.11)
GFR, EST AFRICAN AMERICAN: 60 mL/min (ref >=60)
GFR, Est Non African American: 52 mL/min — ABNORMAL LOW (ref >=60)
Glucose, Bld: 87 mg/dL (ref 65–99)
Potassium: 3.9 mmol/L (ref 3.5–5.3)
SODIUM: 137 mmol/L (ref 135–146)

## 2015-02-23 LAB — LIPID PANEL
CHOL/HDL RATIO: 3.8 ratio (ref <=5.0)
CHOLESTEROL: 178 mg/dL (ref 125–200)
HDL: 47 mg/dL (ref >=40)
LDL Cholesterol: 97 mg/dL (ref <130)
TRIGLYCERIDES: 171 mg/dL — AB (ref <150)
VLDL: 34 mg/dL — ABNORMAL HIGH (ref <30)

## 2015-02-23 LAB — HEPATIC FUNCTION PANEL
ALBUMIN: 4.1 g/dL (ref 3.6–5.1)
ALT: 18 U/L (ref 9–46)
AST: 21 U/L (ref 10–35)
Alkaline Phosphatase: 85 U/L (ref 40–115)
BILIRUBIN DIRECT: 0.1 mg/dL (ref <=0.2)
BILIRUBIN TOTAL: 0.6 mg/dL (ref 0.2–1.2)
Indirect Bilirubin: 0.5 mg/dL (ref 0.2–1.2)
Total Protein: 6.5 g/dL (ref 6.1–8.1)

## 2015-02-23 LAB — HEMOGLOBIN A1C
Hgb A1c MFr Bld: 5.7 % — ABNORMAL HIGH (ref <5.7)
Mean Plasma Glucose: 117 mg/dL — ABNORMAL HIGH (ref <117)

## 2015-02-23 LAB — TSH: TSH: 18.404 u[IU]/mL — AB (ref 0.350–4.500)

## 2015-02-23 LAB — MICROALBUMIN / CREATININE URINE RATIO
CREATININE, URINE: 181 mg/dL (ref 20–370)
MICROALB UR: 0.9 mg/dL
Microalb Creat Ratio: 5 mcg/mg creat (ref <30)

## 2015-02-23 LAB — VITAMIN D 25 HYDROXY (VIT D DEFICIENCY, FRACTURES): VIT D 25 HYDROXY: 43 ng/mL (ref 30–100)

## 2015-02-23 LAB — INSULIN, RANDOM: INSULIN: 6.9 u[IU]/mL (ref 2.0–19.6)

## 2015-02-23 LAB — MAGNESIUM: MAGNESIUM: 2.1 mg/dL (ref 1.5–2.5)

## 2015-02-23 LAB — PSA: PSA: 14.56 ng/mL — AB (ref <=4.00)

## 2015-05-26 ENCOUNTER — Ambulatory Visit (INDEPENDENT_AMBULATORY_CARE_PROVIDER_SITE_OTHER): Payer: Medicare Other | Admitting: Internal Medicine

## 2015-05-26 VITALS — BP 124/72 | HR 72 | Temp 97.1°F | Resp 16 | Ht 67.5 in | Wt 164.0 lb

## 2015-05-26 DIAGNOSIS — R7303 Prediabetes: Secondary | ICD-10-CM

## 2015-05-26 DIAGNOSIS — F329 Major depressive disorder, single episode, unspecified: Secondary | ICD-10-CM | POA: Diagnosis not present

## 2015-05-26 DIAGNOSIS — I10 Essential (primary) hypertension: Secondary | ICD-10-CM | POA: Diagnosis not present

## 2015-05-26 DIAGNOSIS — E039 Hypothyroidism, unspecified: Secondary | ICD-10-CM

## 2015-05-26 DIAGNOSIS — Z79899 Other long term (current) drug therapy: Secondary | ICD-10-CM | POA: Diagnosis not present

## 2015-05-26 DIAGNOSIS — E785 Hyperlipidemia, unspecified: Secondary | ICD-10-CM

## 2015-05-26 DIAGNOSIS — F32A Depression, unspecified: Secondary | ICD-10-CM

## 2015-05-26 DIAGNOSIS — E559 Vitamin D deficiency, unspecified: Secondary | ICD-10-CM

## 2015-05-26 MED ORDER — CITALOPRAM HYDROBROMIDE 20 MG PO TABS: ORAL_TABLET | ORAL | Status: DC

## 2015-05-26 MED ORDER — SOLIFENACIN SUCCINATE 10 MG PO TABS: 10.0000 mg | ORAL_TABLET | Freq: Every day | ORAL | Status: DC

## 2015-05-26 MED ORDER — VERAPAMIL HCL ER 240 MG PO TBCR: 240.0000 mg | EXTENDED_RELEASE_TABLET | Freq: Every day | ORAL | Status: DC

## 2015-05-26 MED ORDER — LEVOTHYROXINE SODIUM 75 MCG PO TABS: 75.0000 ug | ORAL_TABLET | Freq: Every day | ORAL | Status: AC

## 2015-05-26 MED ORDER — LOSARTAN POTASSIUM-HCTZ 100-12.5 MG PO TABS: 1.0000 | ORAL_TABLET | Freq: Every morning | ORAL | Status: DC

## 2015-05-26 NOTE — Progress Notes (Signed)
Assessment and Plan:  Hypertension:  -Continue medication,  -monitor blood pressure at home.  -Continue DASH diet.   -Reminder to go to the ER if any CP, SOB, nausea, dizziness, severe HA, changes vision/speech, left arm numbness and tingling, and jaw pain.  Cholesterol: -Continue diet and exercise.  -Check cholesterol.   Pre-diabetes: -Continue diet and exercise.  -Check A1C  Vitamin D Def: -check level -continue medications.   SDAT -appears to be stable at this time -daughter does not notice significant change -meals in wheels set up starting likely next week  Continue diet and meds as discussed. Further disposition pending results of labs.  HPI 82 y.o. male  presents for 3 month follow up with hypertension, hyperlipidemia, prediabetes and vitamin D.   His blood pressure has been controlled at home, today their BP is BP: 124/72 mmHg.   He does not workout. He denies chest pain, shortness of breath, dizziness.  He does walk around his yard where he lives alone. He is trying to walk more    He is not on cholesterol medication and denies myalgias. His cholesterol is at goal. The cholesterol last visit was:   Lab Results  Component Value Date   CHOL 178 02/22/2015   HDL 47 02/22/2015   LDLCALC 97 02/22/2015   TRIG 171* 02/22/2015   CHOLHDL 3.8 02/22/2015     He has been working on diet and exercise for prediabetes, and denies foot ulcerations, hyperglycemia, hypoglycemia , increased appetite, nausea, paresthesia of the feet, polydipsia, polyuria, visual disturbances, vomiting and weight loss. Last A1C in the office was:  Lab Results  Component Value Date   HGBA1C 5.7* 02/22/2015    Patient is on Vitamin D supplement.  Lab Results  Component Value Date   VD25OH 81 02/22/2015      He reports that he has been keeping up with housework living on his own.  His daughter has gotten him signed up for Meals on Wheels as he was not having hot meals daily.     Current  Medications:  Current Outpatient Prescriptions on File Prior to Visit  Medication Sig Dispense Refill  . aspirin EC 81 MG tablet Take 81 mg by mouth daily.    . cholecalciferol (VITAMIN D) 1000 UNITS tablet Take 1,000 Units by mouth 2 (two) times daily.    Marland Kitchen levothyroxine (SYNTHROID, LEVOTHROID) 75 MCG tablet take 1 tablet by mouth once daily 90 tablet 1  . losartan-hydrochlorothiazide (HYZAAR) 100-12.5 MG tablet take 1 tablet by mouth every morning 90 tablet 1  . solifenacin (VESICARE) 10 MG tablet Take by mouth daily.    . verapamil (CALAN-SR) 240 MG CR tablet take 1 capsule by mouth once daily 90 tablet 3  . citalopram (CELEXA) 20 MG tablet Take 1&1/2 tablets (= 30 mg) Daily for mood and depression 135 tablet 1  . QUEtiapine (SEROQUEL) 50 MG tablet take 1 tablet by mouth twice a day (Patient not taking: Reported on 05/26/2015) 60 tablet 5   No current facility-administered medications on file prior to visit.    Medical History:  Past Medical History  Diagnosis Date  . HTN (hypertension)   . Vitamin D deficiency   . Prostate cancer ([place])   . Hypothyroid   . Prediabetes   . Other testicular hypofunction   . Depression     Allergies:  Allergies  Allergen Reactions  . Penicillins      Review of Systems:  Review of Systems  Constitutional: Negative for fever, chills and  malaise/fatigue.  HENT: Negative for congestion, ear pain and sore throat.   Respiratory: Negative for cough, shortness of breath and wheezing.   Cardiovascular: Negative for chest pain, palpitations and leg swelling.  Gastrointestinal: Negative for heartburn, abdominal pain, diarrhea, constipation, blood in stool and melena.  Genitourinary: Negative.   Skin: Negative.   Neurological: Negative for dizziness, sensory change, loss of consciousness and headaches.  Psychiatric/Behavioral: Positive for memory loss. Negative for depression. The patient is not nervous/anxious and does not have insomnia.     Family  history- Review and unchanged  Social history- Review and unchanged  Physical Exam: BP 124/72 mmHg  Pulse 72  Temp(Src) 97.1 F (36.2 C)  Resp 16  Ht 5' 7.5" (1.715 m)  Wt 164 lb (74.39 kg)  BMI 25.29 kg/m2 Wt Readings from Last 3 Encounters:  05/26/15 164 lb (74.39 kg)  02/22/15 159 lb 3.2 oz (72.213 kg)  11/04/14 158 lb (71.668 kg)    General Appearance: Well nourished well developed, in no apparent distress. Eyes: PERRLA, EOMs, conjunctiva no swelling or erythema ENT/Mouth: Ear canals normal without obstruction, swelling, erythma, discharge.  TMs normal bilaterally.  Oropharynx moist, clear, without exudate, or postoropharyngeal swelling. Neck: Supple, thyroid normal,no cervical adenopathy  Respiratory: Respiratory effort normal, Breath sounds clear A&P without rhonchi, wheeze, or rale.  No retractions, no accessory usage. Cardio: RRR with no MRGs. Brisk peripheral pulses without edema.  Abdomen: Soft, + BS,  Non tender, no guarding, rebound, hernias, masses. Musculoskeletal: Full ROM, 5/5 strength, Normal gait Skin: Warm, dry without rashes, lesions, ecchymosis.  Neuro: Awake and oriented X 3, Cranial nerves intact. Normal muscle tone, no cerebellar symptoms. Psych: Normal affect, Insight and Judgment appropriate.    Starlyn Skeans, PA-C 3:42 PM [facility]

## 2015-05-27 ENCOUNTER — Other Ambulatory Visit: Payer: Self-pay | Admitting: Internal Medicine

## 2015-05-27 DIAGNOSIS — G301 Alzheimer's disease with late onset: Principal | ICD-10-CM

## 2015-05-27 DIAGNOSIS — F0280 Dementia in other diseases classified elsewhere without behavioral disturbance: Secondary | ICD-10-CM

## 2015-05-27 LAB — HEPATIC FUNCTION PANEL
ALK PHOS: 81 U/L (ref 40–115)
ALT: 14 U/L (ref 9–46)
AST: 15 U/L (ref 10–35)
Albumin: 3.8 g/dL (ref 3.6–5.1)
BILIRUBIN DIRECT: 0.1 mg/dL (ref <=0.2)
BILIRUBIN INDIRECT: 0.5 mg/dL (ref 0.2–1.2)
Total Bilirubin: 0.6 mg/dL (ref 0.2–1.2)
Total Protein: 6.3 g/dL (ref 6.1–8.1)

## 2015-05-27 LAB — TSH: TSH: 48.89 mIU/L — ABNORMAL HIGH (ref 0.40–4.50)

## 2015-05-27 LAB — CBC WITH DIFFERENTIAL/PLATELET
BASOS PCT: 1 % (ref 0–1)
Basophils Absolute: 0.1 10*3/uL (ref 0.0–0.1)
EOS ABS: 0.3 10*3/uL (ref 0.0–0.7)
EOS PCT: 5 % (ref 0–5)
HCT: 43.1 % (ref 39.0–52.0)
Hemoglobin: 14.1 g/dL (ref 13.0–17.0)
LYMPHS ABS: 1.6 10*3/uL (ref 0.7–4.0)
Lymphocytes Relative: 27 % (ref 12–46)
MCH: 29.4 pg (ref 26.0–34.0)
MCHC: 32.7 g/dL (ref 30.0–36.0)
MCV: 90 fL (ref 78.0–100.0)
MONO ABS: 0.3 10*3/uL (ref 0.1–1.0)
MONOS PCT: 5 % (ref 3–12)
MPV: 10.9 fL (ref 8.6–12.4)
NEUTROS PCT: 62 % (ref 43–77)
Neutro Abs: 3.7 10*3/uL (ref 1.7–7.7)
PLATELETS: 191 10*3/uL (ref 150–400)
RBC: 4.79 MIL/uL (ref 4.22–5.81)
RDW: 14.5 % (ref 11.5–15.5)
WBC: 6 10*3/uL (ref 4.0–10.5)

## 2015-05-27 LAB — BASIC METABOLIC PANEL WITH GFR
BUN: 18 mg/dL (ref 7–25)
CALCIUM: 9.9 mg/dL (ref 8.6–10.3)
CO2: 26 mmol/L (ref 20–31)
CREATININE: 1.59 mg/dL — AB (ref 0.70–1.11)
Chloride: 101 mmol/L (ref 98–110)
GFR, Est African American: 45 mL/min — ABNORMAL LOW (ref >=60)
GFR, Est Non African American: 39 mL/min — ABNORMAL LOW (ref >=60)
Glucose, Bld: 84 mg/dL (ref 65–99)
Potassium: 3.7 mmol/L (ref 3.5–5.3)
SODIUM: 137 mmol/L (ref 135–146)

## 2015-05-28 DIAGNOSIS — R7303 Prediabetes: Secondary | ICD-10-CM | POA: Diagnosis not present

## 2015-05-28 DIAGNOSIS — I10 Essential (primary) hypertension: Secondary | ICD-10-CM | POA: Diagnosis not present

## 2015-05-28 DIAGNOSIS — G309 Alzheimer's disease, unspecified: Secondary | ICD-10-CM | POA: Diagnosis not present

## 2015-05-28 DIAGNOSIS — F329 Major depressive disorder, single episode, unspecified: Secondary | ICD-10-CM | POA: Diagnosis not present

## 2015-05-28 DIAGNOSIS — F0280 Dementia in other diseases classified elsewhere without behavioral disturbance: Secondary | ICD-10-CM | POA: Diagnosis not present

## 2015-07-14 ENCOUNTER — Ambulatory Visit: Payer: Medicare Other | Admitting: Internal Medicine

## 2015-07-14 ENCOUNTER — Encounter: Payer: Self-pay | Admitting: Internal Medicine

## 2015-07-14 VITALS — BP 142/66 | HR 56 | Temp 97.7°F | Resp 16 | Ht 66.75 in | Wt 156.0 lb

## 2015-07-14 DIAGNOSIS — F0280 Dementia in other diseases classified elsewhere without behavioral disturbance: Secondary | ICD-10-CM

## 2015-07-14 DIAGNOSIS — G301 Alzheimer's disease with late onset: Principal | ICD-10-CM

## 2015-07-14 NOTE — Progress Notes (Signed)
Patient ID: [name] [name], male   DOB: [birth year], 82 y.o.   MRN: [id number]  Patient brought in by daughter Beverlee Nims to fill out [facility] forms to hopefully get assistance in the home - Forms completed - (No Charge)

## 2015-09-01 ENCOUNTER — Ambulatory Visit: Payer: Self-pay | Admitting: Internal Medicine

## 2015-09-28 ENCOUNTER — Other Ambulatory Visit: Payer: Self-pay | Admitting: Internal Medicine

## 2015-09-28 DIAGNOSIS — F2081 Schizophreniform disorder: Secondary | ICD-10-CM

## 2015-09-28 DIAGNOSIS — F329 Major depressive disorder, single episode, unspecified: Secondary | ICD-10-CM

## 2015-09-28 DIAGNOSIS — F32A Depression, unspecified: Secondary | ICD-10-CM

## 2015-09-28 MED ORDER — QUETIAPINE FUMARATE 100 MG PO TABS: ORAL_TABLET | ORAL | 2 refills | Status: DC

## 2015-09-28 MED ORDER — CITALOPRAM HYDROBROMIDE 40 MG PO TABS
ORAL_TABLET | ORAL | 2 refills | Status: DC
Start: 2015-09-28 — End: 2016-01-12

## 2015-10-06 ENCOUNTER — Ambulatory Visit (INDEPENDENT_AMBULATORY_CARE_PROVIDER_SITE_OTHER): Payer: Medicare Other | Admitting: Internal Medicine

## 2015-10-06 ENCOUNTER — Encounter: Payer: Self-pay | Admitting: Internal Medicine

## 2015-10-06 VITALS — BP 124/76 | HR 72 | Temp 97.5°F | Resp 16 | Ht 66.75 in | Wt 150.8 lb

## 2015-10-06 DIAGNOSIS — E785 Hyperlipidemia, unspecified: Secondary | ICD-10-CM

## 2015-10-06 DIAGNOSIS — Z79899 Other long term (current) drug therapy: Secondary | ICD-10-CM | POA: Diagnosis not present

## 2015-10-06 DIAGNOSIS — E559 Vitamin D deficiency, unspecified: Secondary | ICD-10-CM

## 2015-10-06 DIAGNOSIS — I10 Essential (primary) hypertension: Secondary | ICD-10-CM

## 2015-10-06 DIAGNOSIS — G301 Alzheimer's disease with late onset: Secondary | ICD-10-CM

## 2015-10-06 DIAGNOSIS — G308 Other Alzheimer's disease: Secondary | ICD-10-CM

## 2015-10-06 DIAGNOSIS — F0280 Dementia in other diseases classified elsewhere without behavioral disturbance: Secondary | ICD-10-CM

## 2015-10-06 DIAGNOSIS — E039 Hypothyroidism, unspecified: Secondary | ICD-10-CM | POA: Diagnosis not present

## 2015-10-06 DIAGNOSIS — R7303 Prediabetes: Secondary | ICD-10-CM

## 2015-10-06 LAB — CBC WITH DIFFERENTIAL/PLATELET
BASOS PCT: 0 %
Basophils Absolute: 0 cells/uL (ref 0–200)
EOS PCT: 3 %
Eosinophils Absolute: 168 cells/uL (ref 15–500)
HEMATOCRIT: 39.8 % (ref 38.5–50.0)
Hemoglobin: 13 g/dL — ABNORMAL LOW (ref 13.2–17.1)
Lymphocytes Relative: 19 %
Lymphs Abs: 1064 cells/uL (ref 850–3900)
MCH: 29.7 pg (ref 27.0–33.0)
MCHC: 32.7 g/dL (ref 32.0–36.0)
MCV: 91.1 fL (ref 80.0–100.0)
MONO ABS: 392 {cells}/uL (ref 200–950)
MPV: 10.7 fL (ref 7.5–12.5)
Monocytes Relative: 7 %
Neutro Abs: 3976 cells/uL (ref 1500–7800)
Neutrophils Relative %: 71 %
Platelets: 178 10*3/uL (ref 140–400)
RBC: 4.37 MIL/uL (ref 4.20–5.80)
RDW: 15 % (ref 11.0–15.0)
WBC: 5.6 10*3/uL (ref 3.8–10.8)

## 2015-10-06 NOTE — Patient Instructions (Signed)

## 2015-10-06 NOTE — Progress Notes (Signed)
[facility]                       Unk Pinto, M.D.        Uvaldo Bristle. Silverio Lay, P.A.-C       Starlyn Skeans, P.A.-C   [facility]                [address] [place] [id number] Telephone [phone] Telefax [phone] ______________________________________________________________________     This very nice 82 y.o.WWM presents for 3 month follow up with Hypertension, Hyperlipidemia, Pre-Diabetes and Vitamin D Deficiency. Patient also has hx/o prostate & Bladder cancer predating from 1999. Patient has Moderate SDAT and recently has been moved in with his daughter.      Patient is treated for HTN circa 1982 & BP has been controlled at home. Today's BP: 124/76. Patient has had no complaints of any cardiac type chest pain, palpitations, dyspnea/orthopnea/PND, dizziness, claudication, or dependent edema. Patient has CKD2  (GFR 46 ml/min) attributed to his HTN.      Hyperlipidemia is controlled with diet & meds. Patient denies myalgias or other med SE's. Last Lipids were at goal with sl elevated Trig's: Lab Results  Component Value Date   CHOL 178 02/22/2015   HDL 47 02/22/2015   LDLCALC 97 02/22/2015   TRIG 171 (H) 02/22/2015   CHOLHDL 3.8 02/22/2015      Also, the patient has history of PreDiabetes and since 2011 with A1c 5.8% and 5.9% in 2013 has had no symptoms of reactive hypoglycemia, diabetic polys, paresthesias or visual blurring.  Last A1c was  Lab Results  Component Value Date   HGBA1C 5.7 (H) 02/22/2015       Patienty has been on Thyroid Replacement since 2004. Further, the patient also has history of Vitamin D Deficiency of "25" in 2008 and supplements vitamin D sporadically without any suspected side-effects. Last vitamin D was still very low : Lab Results  Component Value Date   VD25OH 46 02/22/2015   Current Outpatient Prescriptions on File Prior to Visit  Medication Sig   . aspirin EC 81 MG tablet Take 81 mg by mouth daily.  . cholecalciferol (VITAMIN D) 1000 UNITS tablet Take 1,000 Units by mouth 2 (two) times daily.  . citalopram (CELEXA) 40 MG tablet Take 1 tablet daily for mood and depression  . levothyroxine (SYNTHROID, LEVOTHROID) 75 MCG tablet Take 1 tablet (75 mcg total) by mouth daily.  Marland Kitchen losartan-hydrochlorothiazide (HYZAAR) 100-12.5 MG tablet Take 1 tablet by mouth every morning.  Marland Kitchen QUEtiapine (SEROQUEL) 100 MG tablet Take 1 tablet 2 x / day  . solifenacin (VESICARE) 10 MG tablet Take 1 tablet (10 mg total) by mouth daily.  . verapamil (CALAN-SR) 240 MG CR tablet Take 1 tablet (240 mg total) by mouth daily. (Patient not taking: Reported on 10/06/2015)   No current facility-administered medications on file prior to visit.    Allergies  Allergen Reactions  . Penicillins    PMHx:   Past Medical History:  Diagnosis Date  . Depression   . HTN (hypertension)   . Hypothyroid   . Other testicular hypofunction   . Prediabetes   . Prostate cancer ([place])   . Vitamin D deficiency    Immunization History  Administered Date(s) Administered  . Influenza Split 12/18/2012  . Influenza, High Dose Seasonal PF 02/22/2015  .  Pneumococcal Conjugate-13 02/22/2015  . Pneumococcal Polysaccharide-23 03/06/2006  . Td 03/06/2004   Past Surgical History:  Procedure Laterality Date  . BIOPSY PROSTATE  2/99   pos for adenocarcinoma of prostate  . CYSTOURETHROSCOPY  7/99   and TUR of bladder cancer  . PROSTATE SURGERY  2007   FHx:    Reviewed / unchanged  SHx:    Reviewed / unchanged  Systems Review:  Constitutional: Denies fever, chills, wt changes, headaches, insomnia, fatigue, night sweats, change in appetite. Eyes: Denies redness, blurred vision, diplopia, discharge, itchy, watery eyes.  ENT: Denies discharge, congestion, post nasal drip, epistaxis, sore throat, earache, hearing loss, dental pain, tinnitus, vertigo, sinus pain, snoring.  CV: Denies  chest pain, palpitations, irregular heartbeat, syncope, dyspnea, diaphoresis, orthopnea, PND, claudication or edema. Respiratory: denies cough, dyspnea, DOE, pleurisy, hoarseness, laryngitis, wheezing.  Gastrointestinal: Denies dysphagia, odynophagia, heartburn, reflux, water brash, abdominal pain or cramps, nausea, vomiting, bloating, diarrhea, constipation, hematemesis, melena, hematochezia  or hemorrhoids. Genitourinary: Denies dysuria, frequency, urgency, nocturia, hesitancy, discharge, hematuria or flank pain. Musculoskeletal: Denies arthralgias, myalgias, stiffness, jt. swelling, pain, limping or strain/sprain.  Skin: Denies pruritus, rash, hives, warts, acne, eczema or change in skin lesion(s). Neuro: No weakness, tremor, incoordination, spasms, paresthesia or pain. Psychiatric: Denies confusion, memory loss or sensory loss. Endo: Denies change in weight, skin or hair change.  Heme/Lymph: No excessive bleeding, bruising or enlarged lymph nodes.  Physical Exam  BP 124/76   Pulse 72   Temp 97.5 F (36.4 C)   Resp 16   Ht 5' 6.75" (1.695 m)   Wt 150 lb 12.8 oz (68.4 kg)   BMI 23.80 kg/m   Appears well nourished and in no distress. Eyes: PERRLA, EOMs, conjunctiva no swelling or erythema. Sinuses: No frontal/maxillary tenderness ENT/Mouth: EAC's clear, TM's nl w/o erythema, bulging. Nares clear w/o erythema, swelling, exudates. Oropharynx clear without erythema or exudates. Oral hygiene is good. Tongue normal, non obstructing. Hearing intact.  Neck: Supple. Thyroid nl. Car 2+/2+ without bruits, nodes or JVD. Chest: Respirations nl with BS clear & equal w/o rales, rhonchi, wheezing or stridor.  Cor: Heart sounds normal w/ regular rate and rhythm without sig. murmurs, gallops, clicks, or rubs. Peripheral pulses normal and equal  without edema.  Abdomen: Soft & bowel sounds normal. Non-tender w/o guarding, rebound, hernias, masses, or organomegaly.  Lymphatics: Unremarkable.   Musculoskeletal: Full ROM all peripheral extremities, joint stability, 5/5 strength, and normal gait.  Skin: Warm, dry without exposed rashes, lesions or ecchymosis apparent.  Neuro: Cranial nerves intact, reflexes equal bilaterally. Sensory-motor testing grossly intact. Tendon reflexes grossly intact.  Pysch: Alert & oriented x 3.  Insight and judgement nl & appropriate. No ideations.  Assessment and Plan:  1. Essential hypertension  - Continue medication, monitor blood pressure at home. Continue DASH diet. Reminder to go to the ER if any CP, SOB, nausea, dizziness, severe HA, changes vision/speech, left arm numbness and tingling and jaw pain. - TSH  2. Hyperlipidemia  - Continue diet/meds, exercise,& lifestyle modifications. Continue monitor periodic cholesterol/liver & renal functions  - TSH  3. Prediabetes  - Continue diet, exercise, lifestyle modifications. Monitor appropriate labs. - Hemoglobin A1c - Insulin, random  4. Vitamin D deficiency  - Continue supplementation. - VITAMIN D 25 Hydroxy  5. Hypothyroidism  - TSH  6. SDAT (senile dementia of Alzheimer's type)   7. Medication management  - CBC with Differential/Platelet - BASIC METABOLIC PANEL WITH GFR - Hepatic function panel - Magnesium   Recommended regular  exercise, BP monitoring, weight control, and discussed med and SE's. Recommended labs to assess and monitor clinical status. Further disposition pending results of labs. Over 30 minutes of exam, counseling, chart review was performed

## 2015-10-07 LAB — BASIC METABOLIC PANEL WITH GFR
BUN: 26 mg/dL — ABNORMAL HIGH (ref 7–25)
CALCIUM: 9.5 mg/dL (ref 8.6–10.3)
CO2: 23 mmol/L (ref 20–31)
Chloride: 106 mmol/L (ref 98–110)
Creat: 1.25 mg/dL — ABNORMAL HIGH (ref 0.70–1.11)
GFR, EST NON AFRICAN AMERICAN: 52 mL/min — AB (ref >=60)
GFR, Est African American: 60 mL/min (ref >=60)
GLUCOSE: 84 mg/dL (ref 65–99)
POTASSIUM: 4 mmol/L (ref 3.5–5.3)
SODIUM: 141 mmol/L (ref 135–146)

## 2015-10-07 LAB — HEPATIC FUNCTION PANEL
ALK PHOS: 71 U/L (ref 40–115)
ALT: 13 U/L (ref 9–46)
AST: 13 U/L (ref 10–35)
Albumin: 3.9 g/dL (ref 3.6–5.1)
BILIRUBIN DIRECT: 0.2 mg/dL (ref <=0.2)
BILIRUBIN INDIRECT: 0.8 mg/dL (ref 0.2–1.2)
TOTAL PROTEIN: 6.3 g/dL (ref 6.1–8.1)
Total Bilirubin: 1 mg/dL (ref 0.2–1.2)

## 2015-10-07 LAB — HEMOGLOBIN A1C
HEMOGLOBIN A1C: 6 % — AB (ref <5.7)
Mean Plasma Glucose: 126 mg/dL

## 2015-10-07 LAB — MAGNESIUM: Magnesium: 2 mg/dL (ref 1.5–2.5)

## 2015-10-07 LAB — INSULIN, RANDOM: Insulin: 3.5 u[IU]/mL (ref 2.0–19.6)

## 2015-10-07 LAB — TSH: TSH: 2.63 m[IU]/L (ref 0.40–4.50)

## 2015-10-07 LAB — VITAMIN D 25 HYDROXY (VIT D DEFICIENCY, FRACTURES): VIT D 25 HYDROXY: 44 ng/mL (ref 30–100)

## 2015-10-09 ENCOUNTER — Encounter: Payer: Self-pay | Admitting: Internal Medicine

## 2015-10-11 ENCOUNTER — Other Ambulatory Visit: Payer: Self-pay | Admitting: Internal Medicine

## 2015-10-15 ENCOUNTER — Encounter: Payer: Self-pay | Admitting: Physician Assistant

## 2016-01-12 ENCOUNTER — Encounter: Payer: Self-pay | Admitting: Internal Medicine

## 2016-01-12 ENCOUNTER — Ambulatory Visit (INDEPENDENT_AMBULATORY_CARE_PROVIDER_SITE_OTHER): Payer: Medicare Other | Admitting: Internal Medicine

## 2016-01-12 VITALS — BP 146/84 | HR 72 | Temp 97.9°F | Resp 16 | Ht 66.75 in | Wt 162.6 lb

## 2016-01-12 DIAGNOSIS — E782 Mixed hyperlipidemia: Secondary | ICD-10-CM

## 2016-01-12 DIAGNOSIS — Z23 Encounter for immunization: Secondary | ICD-10-CM

## 2016-01-12 DIAGNOSIS — E039 Hypothyroidism, unspecified: Secondary | ICD-10-CM

## 2016-01-12 DIAGNOSIS — Z79899 Other long term (current) drug therapy: Secondary | ICD-10-CM | POA: Diagnosis not present

## 2016-01-12 DIAGNOSIS — G301 Alzheimer's disease with late onset: Secondary | ICD-10-CM

## 2016-01-12 DIAGNOSIS — F0280 Dementia in other diseases classified elsewhere without behavioral disturbance: Secondary | ICD-10-CM | POA: Diagnosis not present

## 2016-01-12 DIAGNOSIS — E559 Vitamin D deficiency, unspecified: Secondary | ICD-10-CM

## 2016-01-12 DIAGNOSIS — R7303 Prediabetes: Secondary | ICD-10-CM

## 2016-01-12 DIAGNOSIS — I10 Essential (primary) hypertension: Secondary | ICD-10-CM

## 2016-01-12 LAB — HEPATIC FUNCTION PANEL
ALT: 18 U/L (ref 9–46)
AST: 21 U/L (ref 10–35)
Albumin: 3.8 g/dL (ref 3.6–5.1)
Alkaline Phosphatase: 75 U/L (ref 40–115)
BILIRUBIN DIRECT: 0.2 mg/dL (ref <=0.2)
BILIRUBIN INDIRECT: 0.5 mg/dL (ref 0.2–1.2)
Total Bilirubin: 0.7 mg/dL (ref 0.2–1.2)
Total Protein: 6 g/dL — ABNORMAL LOW (ref 6.1–8.1)

## 2016-01-12 LAB — BASIC METABOLIC PANEL WITH GFR
BUN: 24 mg/dL (ref 7–25)
CHLORIDE: 106 mmol/L (ref 98–110)
CO2: 23 mmol/L (ref 20–31)
Calcium: 9.4 mg/dL (ref 8.6–10.3)
Creat: 1.33 mg/dL — ABNORMAL HIGH (ref 0.70–1.11)
GFR, EST NON AFRICAN AMERICAN: 48 mL/min — AB (ref >=60)
GFR, Est African American: 56 mL/min — ABNORMAL LOW (ref >=60)
Glucose, Bld: 95 mg/dL (ref 65–99)
Potassium: 4.3 mmol/L (ref 3.5–5.3)
SODIUM: 141 mmol/L (ref 135–146)

## 2016-01-12 LAB — CBC WITH DIFFERENTIAL/PLATELET
BASOS ABS: 0 {cells}/uL (ref 0–200)
BASOS PCT: 0 %
EOS PCT: 5 %
Eosinophils Absolute: 260 cells/uL (ref 15–500)
HCT: 42.3 % (ref 38.5–50.0)
HEMOGLOBIN: 13.6 g/dL (ref 13.2–17.1)
LYMPHS ABS: 936 {cells}/uL (ref 850–3900)
Lymphocytes Relative: 18 %
MCH: 28.6 pg (ref 27.0–33.0)
MCHC: 32.2 g/dL (ref 32.0–36.0)
MCV: 89.1 fL (ref 80.0–100.0)
MPV: 11.7 fL (ref 7.5–12.5)
Monocytes Absolute: 468 cells/uL (ref 200–950)
Monocytes Relative: 9 %
NEUTROS ABS: 3536 {cells}/uL (ref 1500–7800)
Neutrophils Relative %: 68 %
Platelets: 170 10*3/uL (ref 140–400)
RBC: 4.75 MIL/uL (ref 4.20–5.80)
RDW: 14.5 % (ref 11.0–15.0)
WBC: 5.2 10*3/uL (ref 3.8–10.8)

## 2016-01-12 LAB — TSH: TSH: 4.29 m[IU]/L (ref 0.40–4.50)

## 2016-01-12 LAB — HEMOGLOBIN A1C
Hgb A1c MFr Bld: 5.3 % (ref <5.7)
MEAN PLASMA GLUCOSE: 105 mg/dL

## 2016-01-12 LAB — MAGNESIUM: Magnesium: 1.9 mg/dL (ref 1.5–2.5)

## 2016-01-12 NOTE — Patient Instructions (Signed)

## 2016-01-12 NOTE — Progress Notes (Signed)
[facility] Unk Pinto, M.D.        Uvaldo Bristle. Silverio Lay, P.A.-C       Starlyn Skeans, P.A.-C  [facility]                [address] [place], [place] [id number] Telephone [phone] Telefax [phone] ______________________________________________________________________     This very nice 82 y.o. WWM  presents for overdue  follow up with Hypertension, Hyperlipidemia, Pre-Diabetes, Hypothyroidism and Vitamin D Deficiency. Patient also has SDAT and has been declining more over the last year and has moved in with his daughter.      Patient is treated for HTN (1982)and has CKD2 (GFR 46 ml/min)  & BP has been controlled at home. Today's BP is 146/84. Patient has had no complaints of any cardiac type chest pain, palpitations, dyspnea/orthopnea/PND, dizziness, claudication, or dependent edema.     Hyperlipidemia is controlled with diet & meds. Patient denies myalgias or other med SE's. Last Lipids were at goal albeit elevated Trig's: Lab Results  Component Value Date   CHOL 178 02/22/2015   HDL 47 02/22/2015   LDLCALC 97 02/22/2015   TRIG 171 (H) 02/22/2015   CHOLHDL 3.8 02/22/2015      Also, the patient has history of PreDM since 2011 with A1c 5.8% and has had no symptoms of reactive hypoglycemia, diabetic polys, paresthesias or visual blurring.  Last A1c was not at goal:  Lab Results  Component Value Date   HGBA1C 6.0 (H) 10/06/2015      Further, the patient also has history of Vitamin D Deficiency in 2008 of "25"  and supplements vitamin D sporadically without any suspected side-effects. Last vitamin D was still low:  Lab Results  Component Value Date   VD25OH 44 10/06/2015   Current Outpatient Prescriptions on File Prior to Visit  Medication Sig  . aspirin EC 81 MG tablet Take 81 mg by mouth daily.  . cholecalciferol (VITAMIN D) 1000 UNITS tablet Take 1,000 Units by mouth 2 (two) times  daily.  Marland Kitchen levothyroxine 75 MCG tablet Take 1 tah daily.  Marland Kitchen losartan-hctz 100-12.5  \(Patient not taking: Reported on 01/12/2016)  . verapamil-SR 240 MG CR  Patient not taking: Reported on 01/12/2016)   No current facility-administered medications on file prior to visit.    Allergies  Allergen Reactions  . Penicillins    PMHx:   Past Medical History:  Diagnosis Date  . Depression   . HTN (hypertension)   . Hypothyroid   . Other testicular hypofunction   . Prediabetes   . Prostate cancer ([place])   . Vitamin D deficiency    Immunization History  Administered Date(s) Administered  . Influenza Split 12/18/2012  . Influenza, High Dose Seasonal PF 02/22/2015  . Influenza,inj,quad, With Preservative 01/12/2016  . Pneumococcal Conjugate-13 02/22/2015  . Pneumococcal Polysaccharide-23 03/06/2006  . Td 03/06/2004   Past Surgical History:  Procedure Laterality Date  . BIOPSY PROSTATE  2/99   pos for adenocarcinoma of prostate  . CYSTOURETHROSCOPY  7/99   and TUR of bladder cancer  . PROSTATE SURGERY  2007   FHx:    Reviewed / unchanged  SHx:    Reviewed / unchanged  Systems Review:  Constitutional: Denies fever, chills, wt changes, headaches, insomnia, fatigue, night sweats, change in appetite. Eyes: Denies redness, blurred vision, diplopia, discharge, itchy, watery eyes.  ENT: Denies  discharge, congestion, post nasal drip, epistaxis, sore throat, earache, hearing loss, dental pain, tinnitus, vertigo, sinus pain, snoring.  CV: Denies chest pain, palpitations, irregular heartbeat, syncope, dyspnea, diaphoresis, orthopnea, PND, claudication or edema. Respiratory: denies cough, dyspnea, DOE, pleurisy, hoarseness, laryngitis, wheezing.  Gastrointestinal: Denies dysphagia, odynophagia, heartburn, reflux, water brash, abdominal pain or cramps, nausea, vomiting, bloating, diarrhea, constipation, hematemesis, melena, hematochezia  or hemorrhoids. Genitourinary: Denies dysuria, frequency,  urgency, nocturia, hesitancy, discharge, hematuria or flank pain. Musculoskeletal: Denies arthralgias, myalgias, stiffness, jt. swelling, pain, limping or strain/sprain.  Skin: Denies pruritus, rash, hives, warts, acne, eczema or change in skin lesion(s). Neuro: No weakness, tremor, incoordination, spasms, paresthesia or pain. Psychiatric: Denies confusion, memory loss or sensory loss. Endo: Denies change in weight, skin or hair change.  Heme/Lymph: No excessive bleeding, bruising or enlarged lymph nodes.  Physical Exam BP (!) 146/84   Pulse 72   Temp 97.9 F (36.6 C)   Resp 16   Ht 5' 6.75" (1.695 m)   Wt 162 lb 9.6 oz (73.8 kg)   BMI 25.66 kg/m   Appears well nourished and in no distress.  Eyes: PERRLA, EOMs, conjunctiva no swelling or erythema. Sinuses: No frontal/maxillary tenderness ENT/Mouth: EAC's clear, TM's nl w/o erythema, bulging. Nares clear w/o erythema, swelling, exudates. Oropharynx clear without erythema or exudates. Oral hygiene is good. Tongue normal, non obstructing. Hearing intact.  Neck: Supple. Thyroid nl. Car 2+/2+ without bruits, nodes or JVD. Chest: Respirations nl with BS clear & equal w/o rales, rhonchi, wheezing or stridor.  Cor: Heart sounds normal w/ regular rate and rhythm without sig. murmurs, gallops, clicks, or rubs. Peripheral pulses normal and equal  without edema.  Abdomen: Soft & bowel sounds normal. Non-tender w/o guarding, rebound, hernias, masses, or organomegaly.  Lymphatics: Unremarkable.  Musculoskeletal: Full ROM all peripheral extremities, joint stability, 5/5 strength, and normal gait.  Skin: Warm, dry without exposed rashes, lesions or ecchymosis apparent.  Neuro: Cranial nerves intact, reflexes equal bilaterally. Sensory-motor testing grossly intact. Tendon reflexes grossly intact.  Pysch: Alert & oriented x 3.  Insight and judgement nl & appropriate. No ideations.  Assessment and Plan:   1. Essential hypertension  - Continue  medication, monitor blood pressure at home.  - Continue DASH diet. Reminder to go to the ER if any CP,  SOB, nausea, dizziness, severe HA, changes vision/speech,  left arm numbness and tingling and jaw pain. - TSH  2. Mixed hyperlipidemia  - Continue diet/meds, exercise,& lifestyle modifications.  - Continue monitor periodic cholesterol/liver & renal functions  - TSH  3. Prediabetes  - Continue diet, exercise, lifestyle modifications. Monitor appropriate labs. - Hemoglobin A1c - Insulin, random  4. Vitamin D deficiency  - Continue supplementation. - VITAMIN D 25 Hydroxy   5. Acquired hypothyroidism   6. SDAT (senile dementia of Alzheimer's type)   7. Medication management  - CBC with Differential/Platelet - BASIC METABOLIC PANEL WITH GFR - Hepatic function panel - Magnesium  8. Need for prophylactic vaccination and inoculation against influenza  - Flu Vaccine QUAD with presevative       Recommended regular exercise, BP monitoring, weight control, and discussed med and SE's. Recommended labs to assess and monitor clinical status. Further disposition pending results of labs. Over 30 minutes of exam, counseling, chart review was performed

## 2016-01-13 LAB — VITAMIN D 25 HYDROXY (VIT D DEFICIENCY, FRACTURES): VIT D 25 HYDROXY: 48 ng/mL (ref 30–100)

## 2016-01-13 LAB — INSULIN, RANDOM: INSULIN: 7.7 u[IU]/mL (ref 2.0–19.6)

## 2016-02-17 DIAGNOSIS — C670 Malignant neoplasm of trigone of bladder: Secondary | ICD-10-CM | POA: Diagnosis not present

## 2016-02-17 DIAGNOSIS — N359 Urethral stricture, unspecified: Secondary | ICD-10-CM | POA: Diagnosis not present

## 2016-02-17 DIAGNOSIS — C61 Malignant neoplasm of prostate: Secondary | ICD-10-CM | POA: Diagnosis not present

## 2016-02-25 ENCOUNTER — Telehealth: Payer: Self-pay | Admitting: Physician Assistant

## 2016-02-25 ENCOUNTER — Encounter (HOSPITAL_COMMUNITY): Payer: Self-pay | Admitting: Emergency Medicine

## 2016-02-25 ENCOUNTER — Ambulatory Visit (HOSPITAL_COMMUNITY)
Admission: RE | Admit: 2016-02-25 | Discharge: 2016-02-25 | Disposition: A | Discharge status: Home / Self Care | Payer: Medicare Other | Source: Ambulatory Visit | Attending: Physician Assistant | Admitting: Physician Assistant

## 2016-02-25 ENCOUNTER — Inpatient Hospital Stay (HOSPITAL_COMMUNITY)
Admission: EM | Admit: 2016-02-25 | Discharge: 2016-03-01 | DRG: 186 | Disposition: A | Discharge status: Home Health | Payer: Medicare Other | Attending: Internal Medicine | Admitting: Internal Medicine

## 2016-02-25 ENCOUNTER — Ambulatory Visit (INDEPENDENT_AMBULATORY_CARE_PROVIDER_SITE_OTHER): Payer: Medicare Other | Admitting: Physician Assistant

## 2016-02-25 ENCOUNTER — Encounter: Payer: Self-pay | Admitting: Physician Assistant

## 2016-02-25 ENCOUNTER — Other Ambulatory Visit: Payer: Self-pay

## 2016-02-25 VITALS — BP 142/90 | HR 67 | Temp 97.7°F | Resp 16 | Ht 66.75 in | Wt 161.8 lb

## 2016-02-25 DIAGNOSIS — Z880 Allergy status to penicillin: Secondary | ICD-10-CM | POA: Diagnosis not present

## 2016-02-25 DIAGNOSIS — F325 Major depressive disorder, single episode, in full remission: Secondary | ICD-10-CM | POA: Diagnosis not present

## 2016-02-25 DIAGNOSIS — N183 Chronic kidney disease, stage 3 unspecified: Secondary | ICD-10-CM | POA: Diagnosis present

## 2016-02-25 DIAGNOSIS — G301 Alzheimer's disease with late onset: Secondary | ICD-10-CM | POA: Diagnosis not present

## 2016-02-25 DIAGNOSIS — I10 Essential (primary) hypertension: Secondary | ICD-10-CM | POA: Diagnosis present

## 2016-02-25 DIAGNOSIS — Z8673 Personal history of transient ischemic attack (TIA), and cerebral infarction without residual deficits: Secondary | ICD-10-CM

## 2016-02-25 DIAGNOSIS — Z0001 Encounter for general adult medical examination with abnormal findings: Secondary | ICD-10-CM | POA: Diagnosis not present

## 2016-02-25 DIAGNOSIS — E782 Mixed hyperlipidemia: Secondary | ICD-10-CM

## 2016-02-25 DIAGNOSIS — I5041 Acute combined systolic (congestive) and diastolic (congestive) heart failure: Secondary | ICD-10-CM | POA: Diagnosis not present

## 2016-02-25 DIAGNOSIS — I4510 Unspecified right bundle-branch block: Secondary | ICD-10-CM

## 2016-02-25 DIAGNOSIS — H9190 Unspecified hearing loss, unspecified ear: Secondary | ICD-10-CM | POA: Diagnosis present

## 2016-02-25 DIAGNOSIS — J90 Pleural effusion, not elsewhere classified: Principal | ICD-10-CM | POA: Diagnosis present

## 2016-02-25 DIAGNOSIS — F0280 Dementia in other diseases classified elsewhere without behavioral disturbance: Secondary | ICD-10-CM | POA: Diagnosis present

## 2016-02-25 DIAGNOSIS — Z66 Do not resuscitate: Secondary | ICD-10-CM | POA: Diagnosis present

## 2016-02-25 DIAGNOSIS — R0609 Other forms of dyspnea: Secondary | ICD-10-CM | POA: Diagnosis not present

## 2016-02-25 DIAGNOSIS — E349 Endocrine disorder, unspecified: Secondary | ICD-10-CM | POA: Diagnosis not present

## 2016-02-25 DIAGNOSIS — Z8546 Personal history of malignant neoplasm of prostate: Secondary | ICD-10-CM | POA: Diagnosis not present

## 2016-02-25 DIAGNOSIS — N3281 Overactive bladder: Secondary | ICD-10-CM

## 2016-02-25 DIAGNOSIS — Z7982 Long term (current) use of aspirin: Secondary | ICD-10-CM

## 2016-02-25 DIAGNOSIS — R0600 Dyspnea, unspecified: Secondary | ICD-10-CM | POA: Diagnosis not present

## 2016-02-25 DIAGNOSIS — E663 Overweight: Secondary | ICD-10-CM | POA: Diagnosis present

## 2016-02-25 DIAGNOSIS — F329 Major depressive disorder, single episode, unspecified: Secondary | ICD-10-CM | POA: Diagnosis present

## 2016-02-25 DIAGNOSIS — R6889 Other general symptoms and signs: Secondary | ICD-10-CM

## 2016-02-25 DIAGNOSIS — R7303 Prediabetes: Secondary | ICD-10-CM | POA: Diagnosis present

## 2016-02-25 DIAGNOSIS — Z79899 Other long term (current) drug therapy: Secondary | ICD-10-CM

## 2016-02-25 DIAGNOSIS — E039 Hypothyroidism, unspecified: Secondary | ICD-10-CM

## 2016-02-25 DIAGNOSIS — I5033 Acute on chronic diastolic (congestive) heart failure: Secondary | ICD-10-CM | POA: Diagnosis not present

## 2016-02-25 DIAGNOSIS — E559 Vitamin D deficiency, unspecified: Secondary | ICD-10-CM | POA: Diagnosis not present

## 2016-02-25 DIAGNOSIS — J9600 Acute respiratory failure, unspecified whether with hypoxia or hypercapnia: Secondary | ICD-10-CM | POA: Diagnosis present

## 2016-02-25 DIAGNOSIS — R0602 Shortness of breath: Secondary | ICD-10-CM | POA: Diagnosis not present

## 2016-02-25 DIAGNOSIS — I5043 Acute on chronic combined systolic (congestive) and diastolic (congestive) heart failure: Secondary | ICD-10-CM | POA: Diagnosis not present

## 2016-02-25 DIAGNOSIS — J9811 Atelectasis: Secondary | ICD-10-CM | POA: Insufficient documentation

## 2016-02-25 DIAGNOSIS — Z7952 Long term (current) use of systemic steroids: Secondary | ICD-10-CM

## 2016-02-25 DIAGNOSIS — I130 Hypertensive heart and chronic kidney disease with heart failure and stage 1 through stage 4 chronic kidney disease, or unspecified chronic kidney disease: Secondary | ICD-10-CM | POA: Diagnosis present

## 2016-02-25 DIAGNOSIS — I509 Heart failure, unspecified: Secondary | ICD-10-CM | POA: Diagnosis not present

## 2016-02-25 DIAGNOSIS — I313 Pericardial effusion (noninflammatory): Secondary | ICD-10-CM | POA: Diagnosis not present

## 2016-02-25 DIAGNOSIS — I429 Cardiomyopathy, unspecified: Secondary | ICD-10-CM | POA: Diagnosis not present

## 2016-02-25 DIAGNOSIS — Z6820 Body mass index (BMI) 20.0-20.9, adult: Secondary | ICD-10-CM

## 2016-02-25 DIAGNOSIS — I5042 Chronic combined systolic (congestive) and diastolic (congestive) heart failure: Secondary | ICD-10-CM

## 2016-02-25 DIAGNOSIS — Z8551 Personal history of malignant neoplasm of bladder: Secondary | ICD-10-CM | POA: Diagnosis not present

## 2016-02-25 DIAGNOSIS — Z0000 Encounter for general adult medical examination without abnormal findings: Secondary | ICD-10-CM

## 2016-02-25 DIAGNOSIS — Z136 Encounter for screening for cardiovascular disorders: Secondary | ICD-10-CM | POA: Diagnosis not present

## 2016-02-25 HISTORY — DX: Cerebral infarction, unspecified: I63.9

## 2016-02-25 LAB — CBC
HCT: 41.3 % (ref 39.0–52.0)
HEMOGLOBIN: 13.5 g/dL (ref 13.0–17.0)
MCH: 28.1 pg (ref 26.0–34.0)
MCHC: 32.7 g/dL (ref 30.0–36.0)
MCV: 85.9 fL (ref 78.0–100.0)
PLATELETS: 165 10*3/uL (ref 150–400)
RBC: 4.81 MIL/uL (ref 4.22–5.81)
RDW: 16.2 % — ABNORMAL HIGH (ref 11.5–15.5)
WBC: 5.8 10*3/uL (ref 4.0–10.5)

## 2016-02-25 LAB — HEPATIC FUNCTION PANEL
ALK PHOS: 83 U/L (ref 38–126)
ALT: 31 U/L (ref 17–63)
AST: 41 U/L (ref 15–41)
Albumin: 3.8 g/dL (ref 3.5–5.0)
BILIRUBIN INDIRECT: 0.5 mg/dL (ref 0.3–0.9)
Bilirubin, Direct: 0.3 mg/dL (ref 0.1–0.5)
TOTAL PROTEIN: 6.4 g/dL — AB (ref 6.5–8.1)
Total Bilirubin: 0.8 mg/dL (ref 0.3–1.2)

## 2016-02-25 LAB — BASIC METABOLIC PANEL
Anion gap: 9 (ref 5–15)
BUN: 23 mg/dL — ABNORMAL HIGH (ref 6–20)
CHLORIDE: 106 mmol/L (ref 101–111)
CO2: 25 mmol/L (ref 22–32)
CREATININE: 1.48 mg/dL — AB (ref 0.61–1.24)
Calcium: 9.7 mg/dL (ref 8.9–10.3)
GFR calc non Af Amer: 41 mL/min — ABNORMAL LOW (ref >60)
GFR, EST AFRICAN AMERICAN: 48 mL/min — AB (ref >60)
Glucose, Bld: 95 mg/dL (ref 65–99)
Potassium: 4 mmol/L (ref 3.5–5.1)
SODIUM: 140 mmol/L (ref 135–145)

## 2016-02-25 LAB — I-STAT TROPONIN, ED: Troponin i, poc: 0.06 ng/mL (ref 0.00–0.08)

## 2016-02-25 LAB — BRAIN NATRIURETIC PEPTIDE: B NATRIURETIC PEPTIDE 5: 1153.6 pg/mL — AB (ref 0.0–100.0)

## 2016-02-25 LAB — TROPONIN I: Troponin I: 0.06 ng/mL (ref <0.03)

## 2016-02-25 LAB — GLUCOSE, CAPILLARY: Glucose-Capillary: 148 mg/dL — ABNORMAL HIGH (ref 65–99)

## 2016-02-25 MED ORDER — INSULIN ASPART 100 UNIT/ML ~~LOC~~ SOLN: 0.0000 [IU] | Freq: Every day | SUBCUTANEOUS | Status: DC

## 2016-02-25 MED ORDER — ONDANSETRON HCL 4 MG/2ML IJ SOLN: 4.0000 mg | Freq: Four times a day (QID) | INTRAMUSCULAR | Status: DC | PRN

## 2016-02-25 MED ORDER — ENOXAPARIN SODIUM 30 MG/0.3ML ~~LOC~~ SOLN
30.0000 mg | SUBCUTANEOUS | Status: DC
  Administered 2016-02-26: 30 mg via SUBCUTANEOUS
  Filled 2016-02-25: qty 0.3

## 2016-02-25 MED ORDER — SODIUM CHLORIDE 0.9% FLUSH
3.0000 mL | Freq: Two times a day (BID) | INTRAVENOUS | Status: DC
  Administered 2016-02-25 – 2016-03-01 (×6): 3 mL via INTRAVENOUS

## 2016-02-25 MED ORDER — QUETIAPINE FUMARATE 200 MG PO TABS
200.0000 mg | ORAL_TABLET | Freq: Every day | ORAL | Status: DC
  Administered 2016-02-25 – 2016-02-29 (×5): 200 mg via ORAL
  Filled 2016-02-25: qty 8
  Filled 2016-02-25: qty 1
  Filled 2016-02-25: qty 8
  Filled 2016-02-25: qty 1
  Filled 2016-02-25: qty 8

## 2016-02-25 MED ORDER — ESCITALOPRAM OXALATE 10 MG PO TABS
10.0000 mg | ORAL_TABLET | ORAL | Status: DC
  Administered 2016-02-26 – 2016-03-01 (×5): 10 mg via ORAL
  Filled 2016-02-25 (×5): qty 1

## 2016-02-25 MED ORDER — LEVOTHYROXINE SODIUM 75 MCG PO TABS
75.0000 ug | ORAL_TABLET | Freq: Every day | ORAL | Status: DC
  Administered 2016-02-26 – 2016-03-01 (×5): 75 ug via ORAL
  Filled 2016-02-25 (×5): qty 1

## 2016-02-25 MED ORDER — INSULIN ASPART 100 UNIT/ML ~~LOC~~ SOLN: 0.0000 [IU] | Freq: Three times a day (TID) | SUBCUTANEOUS | Status: DC

## 2016-02-25 MED ORDER — AZITHROMYCIN 250 MG PO TABS: ORAL_TABLET | ORAL | 1 refills | Status: DC

## 2016-02-25 MED ORDER — ACETAMINOPHEN 325 MG PO TABS: 650.0000 mg | ORAL_TABLET | ORAL | Status: DC | PRN

## 2016-02-25 MED ORDER — PREDNISONE 20 MG PO TABS: ORAL_TABLET | ORAL | 0 refills | Status: DC

## 2016-02-25 MED ORDER — SODIUM CHLORIDE 0.9 % IV SOLN: 250.0000 mL | INTRAVENOUS | Status: DC | PRN

## 2016-02-25 MED ORDER — ASPIRIN EC 81 MG PO TBEC
81.0000 mg | DELAYED_RELEASE_TABLET | Freq: Every day | ORAL | Status: DC
  Administered 2016-02-26 – 2016-03-01 (×5): 81 mg via ORAL
  Filled 2016-02-25 (×5): qty 1

## 2016-02-25 MED ORDER — POLYETHYLENE GLYCOL 3350 17 G PO PACK: 17.0000 g | PACK | Freq: Every day | ORAL | Status: DC | PRN

## 2016-02-25 MED ORDER — HYDROCODONE-ACETAMINOPHEN 5-325 MG PO TABS: 1.0000 | ORAL_TABLET | ORAL | Status: DC | PRN

## 2016-02-25 MED ORDER — ONDANSETRON HCL 4 MG PO TABS: 4.0000 mg | ORAL_TABLET | Freq: Four times a day (QID) | ORAL | Status: DC | PRN

## 2016-02-25 MED ORDER — SODIUM CHLORIDE 0.9% FLUSH
3.0000 mL | Freq: Two times a day (BID) | INTRAVENOUS | Status: DC
  Administered 2016-02-26 – 2016-02-28 (×3): 3 mL via INTRAVENOUS

## 2016-02-25 MED ORDER — SODIUM CHLORIDE 0.9% FLUSH: 3.0000 mL | INTRAVENOUS | Status: DC | PRN

## 2016-02-25 MED ORDER — FUROSEMIDE 10 MG/ML IJ SOLN
40.0000 mg | Freq: Two times a day (BID) | INTRAMUSCULAR | Status: DC
  Administered 2016-02-26 (×2): 40 mg via INTRAVENOUS
  Filled 2016-02-25 (×2): qty 4

## 2016-02-25 MED ORDER — FUROSEMIDE 10 MG/ML IJ SOLN
20.0000 mg | Freq: Once | INTRAMUSCULAR | Status: AC
  Administered 2016-02-25: 20 mg via INTRAVENOUS
  Filled 2016-02-25: qty 2

## 2016-02-25 NOTE — Progress Notes (Signed)
MEDICARE ANNUAL WELLNESS VISIT AND follow up  Assessment:   1. Essential hypertension - continue medications, DASH diet, exercise and monitor at home. Call if greater than 130/80.   2. Hypothyroidism, unspecified type Hypothyroidism-check TSH level, continue medications the same, reminded to take on an empty stomach 30-83mins before food.   3. Mixed hyperlipidemia - decrease fatty foods, increase activity.   4. Depression, major, in remission ([place]) Continue medications  5. Medication management  6. Prediabetes Decrease carbs  7. Vitamin D deficiency  8. Testosterone deficiency natrual ways to increase testostone  9. Overactive bladder follows Dr. Nichola Sizer  10. RBBB No changes  11. SDAT (senile dementia of Alzheimer's type) Control blood pressure, cholesterol, glucose, increase exercise.   12. Acquired hypothyroidism monitor  13. Medicare annual wellness visit, subsequent Declines most labs  14. History of prostate cancer follows Dr. Nichola Sizer  15. History of bladder cancer follows Dr. Nichola Sizer  16. Dyspnea, unspecified type No acute distress DDX infection, MI, heart failure, PE Just SOB with exertion- some RLL decreased breath sounds will get CXR EKG possible anterior infarction versus RBBB but appears to be new Weight is up but minimal swelling, no PND, orthopnea- will get CXR No tachycardia, no leg swelling but + history of cancer and possibly recurrent according to daughter will get ddimer Daughter and patient prefer not to go to the hospital- understands risk of death- will attempt to manage outpatient- if any worsening SOB or any CP will take to ER.  - EKG 12-Lead - DG Chest 2 View; Future - predniSONE (DELTASONE) 20 MG tablet; 2 tablets daily for 3 days, 1 tablet daily for 4 days.  Dispense: 10 tablet; Refill: 0 - azithromycin (ZITHROMAX) 250 MG tablet; Take 2 tablets (500 mg) on  Day 1,  followed by 1 tablet (250 mg) once daily on Days 2 through  5.  Dispense: 6 each; Refill: 1  Over 40 minutes of exam, counseling, chart review and critical decision making was performed  Future Appointments Date Time Provider [place]  04/19/2016 10:00 AM Unk Pinto, MD [facility] None     Plan:   During the course of the visit the patient was educated and counseled about appropriate screening and preventive services including:    Pneumococcal vaccine  Prevnar 13   Influenza vaccine  Td vaccine  Screening electrocardiogram  Bone densitometry screening  Colorectal cancer screening  Diabetes screening  Glaucoma screening  Nutrition counseling   Advanced directives: requested   Subjective:  [name] [name] is a 82 y.o. male who presents for Medicare Annual Wellness Visit and  Follow up.   He has history of prostate/bladder cancer followed by Dr. Gaynelle Arabian, PSA has been elevated, but just monitoring.   His blood pressure has been controlled at home, today their BP is BP: (!) 142/90  He does not workout. He denies chest pain,  Dizziness. He complains of SOB x 1 month with exertion, some wheezing, no cough, denies CP, daughter Beverlee Nims states that it is concerning, he lives with her. Had normal echo with Dr. Johnsie Cancel 2014 with grade 1 diastolic.  Weight is up however he has no orthopnea, no PND, has unchanged 1+ edema.  Wt Readings from Last 3 Encounters:  02/25/16 161 lb 12.8 oz (73.4 kg)  01/12/16 162 lb 9.6 oz (73.8 kg)  10/06/15 150 lb 12.8 oz (68.4 kg)    He is not on cholesterol medication and denies myalgias. His cholesterol is at goal. The cholesterol last visit was:  Lab Results  Component Value Date   CHOL 178 02/22/2015   HDL 47 02/22/2015   LDLCALC 97 02/22/2015   TRIG 171 (H) 02/22/2015   CHOLHDL 3.8 02/22/2015    He has been working on diet and exercise for prediabetes, and denies polydipsia and polyuria. Last A1C in the office was:  Lab Results  Component Value Date   HGBA1C 5.3 01/12/2016    Patient is on Vitamin D supplement.   Lab Results  Component Value Date   VD25OH 48 01/12/2016     He has dementia, is on lexapro for depression and seroquel for sleep at night, he does not drive and daughter helps with ADLs like fiances, etc.  He is on thyroid medication. His medication was not changed last visit.   Lab Results  Component Value Date   TSH 4.29 01/12/2016   BMI is Body mass index is 25.53 kg/m., he is working on diet and exercise. Wt Readings from Last 3 Encounters:  02/25/16 161 lb 12.8 oz (73.4 kg)  01/12/16 162 lb 9.6 oz (73.8 kg)  10/06/15 150 lb 12.8 oz (68.4 kg)     Medication Review: Current Outpatient Prescriptions on File Prior to Visit  Medication Sig Dispense Refill  . aspirin EC 81 MG tablet Take 81 mg by mouth daily.    . cholecalciferol (VITAMIN D) 1000 UNITS tablet Take 1,000 Units by mouth 2 (two) times daily.    Marland Kitchen escitalopram (LEXAPRO) 10 MG tablet Take 10 mg by mouth daily.    Marland Kitchen levothyroxine (SYNTHROID, LEVOTHROID) 75 MCG tablet Take 1 tablet (75 mcg total) by mouth daily. 90 tablet 1  . Omega-3 Fatty Acids (FISH OIL PO) Take 1 capsule by mouth daily.    . QUEtiapine (SEROQUEL) 200 MG tablet Take 200 mg by mouth at bedtime.     No current facility-administered medications on file prior to visit.     Allergies: Allergies  Allergen Reactions  . Penicillins     Current Problems (verified) Patient Active Problem List   Diagnosis Date Noted  . History of prostate cancer 02/25/2016  . History of bladder cancer 02/25/2016  . Depression, major, in remission ([place]) 02/22/2015  . SDAT (senile dementia of Alzheimer's type) 10/07/2014  . Overactive bladder 03/10/2014  . Hyperlipidemia 04/02/2013  . Medication management 04/02/2013  . Hypothyroid   . Vitamin D deficiency   . Prediabetes   . Testosterone deficiency   . RBBB 12/25/2012  . HTN (hypertension) 12/25/2012    Screening Tests Immunization History  Administered Date(s)  Administered  . Influenza Split 12/18/2012  . Influenza, High Dose Seasonal PF 02/22/2015  . Influenza,inj,quad, With Preservative 01/12/2016  . Pneumococcal Conjugate-13 02/22/2015  . Pneumococcal Polysaccharide-23 03/06/2006  . Td 03/06/2004   Preventative care: Last colonoscopy: 1998, declines another due to age Echo [id number], EF 123456, diastolic grade 1 Bone Scan 04/2012 CXR 2009  Prior vaccinations: TD or Tdap: 2006  Influenza: 2017 Pneumococcal: 2008 Prevnar13: 2016 Shingles/Zostavax: declines  Names of Other Physician/Practitioners you currently use: 1. [facility] here for primary care 2. unknown, eye doctor, encouraged eye exam 3. unknown, dentist, last visit remote Patient Care Team: Unk Pinto, MD as PCP - General (Internal Medicine) Carolan Clines, MD as Consulting Physician (Urology) Josue Hector, MD as Consulting Physician (Cardiology) Elam Dutch, MD as Consulting Physician (Vascular Surgery)  Surgical: Past Surgical History:  Procedure Laterality Date  . BIOPSY PROSTATE  2/99   pos for adenocarcinoma of prostate  .  CYSTOURETHROSCOPY  7/99   and TUR of bladder cancer  . PROSTATE SURGERY  2007   Family His family history includes Heart disease in his mother; Other in his father; Thyroid disease in his father. Social history  He reports that he quit smoking about 52 years ago. His smoking use included Cigarettes. He has a 10.00 pack-year smoking history. He has never used smokeless tobacco. He reports that he does not drink alcohol or use drugs.  MEDICARE WELLNESS OBJECTIVES: Physical activity: Current Exercise Habits: The patient does not participate in regular exercise at present Cardiac risk factors: Cardiac Risk Factors include: advanced age (>54men, >24 women);dyslipidemia;hypertension;male gender;sedentary lifestyle;smoking/ tobacco exposure Depression/mood screen:   Depression screen [facility] 2/9  02/25/2016  Decreased Interest 0  Down, Depressed, Hopeless 0  PHQ - 2 Score 0  Altered sleeping -  Tired, decreased energy -  Change in appetite -  Feeling bad or failure about yourself  -  Trouble concentrating -  Moving slowly or fidgety/restless -  Suicidal thoughts -  PHQ-9 Score -  Difficult doing work/chores -    ADLs:  In your present state of health, do you have any difficulty performing the following activities: 02/25/2016 01/12/2016  Hearing? Y N  Vision? N N  Difficulty concentrating or making decisions? Y N  Walking or climbing stairs? Y N  Dressing or bathing? N N  Doing errands, shopping? Y N  Preparing Food and eating ? Y -  Using the Toilet? Y -  In the past six months, have you accidently leaked urine? Y -  Do you have problems with loss of bowel control? N -  Managing your Medications? Y -  Managing your Finances? Y -  Housekeeping or managing your Housekeeping? Y -  Some recent data might be hidden     Cognitive Testing  Alert? Yes  Normal Appearance?Yes  Oriented to person? Yes  Place? Yes   Time? Yes  Recall of three objects?  Yes  Can perform simple calculations? Yes  Displays appropriate judgment?Yes  Can read the correct time from a watch face?Yes  EOL planning: Does Patient Have a Medical Advance Directive?: Yes Type of Advance Directive: Living will, Healthcare Power of [place] in Chart?: No - copy requested  Review of Systems  Constitutional: Negative for chills, fever and malaise/fatigue.  HENT: Negative for congestion, ear pain and sore throat.   Respiratory: Positive for shortness of breath. Negative for cough, hemoptysis, sputum production and wheezing.   Cardiovascular: Negative for chest pain, palpitations, orthopnea, claudication, leg swelling and PND.  Gastrointestinal: Negative for abdominal pain, blood in stool, constipation, diarrhea, heartburn and melena.  Genitourinary: Negative.   Skin:  Negative.   Neurological: Negative for dizziness, sensory change, loss of consciousness and headaches.  Psychiatric/Behavioral: Positive for memory loss. Negative for depression. The patient is not nervous/anxious and does not have insomnia.      Objective:     Today's Vitals   02/25/16 1109  BP: (!) 142/90  Pulse: 67  Resp: 16  Temp: 97.7 F (36.5 C)  SpO2: 98%  Weight: 161 lb 12.8 oz (73.4 kg)  Height: 5' 6.75" (1.695 m)  PainSc: 0-No pain   Body mass index is 25.53 kg/m.  General appearance: alert, no distress, WD/WN, male HEENT: normocephalic, sclerae anicteric, TMs pearly, nares patent, no discharge or erythema, pharynx normal Oral cavity: MMM, no lesions Neck: supple, no lymphadenopathy, no thyromegaly, no masses Heart: RRR, normal S1, S2, no  murmurs Lungs: Decreased breath sounds RLL, CTA bilaterally, no wheezes, rhonchi, or rales Abdomen: +bs, soft, non tender, non distended, no masses, no hepatomegaly, no splenomegaly Musculoskeletal: nontender, no swelling, no obvious deformity Extremities: 1+ edema, no cyanosis, no clubbing Pulses: 2+ symmetric, upper and lower extremities, normal cap refill Neurological: alert, oriented x 3, CN2-12 intact, strength normal upper extremities and lower extremities,  DTRs 2+ throughout, no cerebellar signs, slow gait Psychiatric: normal affect, behavior normal, pleasant   Medicare Attestation I have personally reviewed: The patient's medical and social history Their use of alcohol, tobacco or illicit drugs Their current medications and supplements The patient's functional ability including ADLs,fall risks, home safety risks, cognitive, and hearing and visual impairment Diet and physical activities Evidence for depression or mood disorders  The patient's weight, height, BMI, and visual acuity have been recorded in the chart.  I have made referrals, counseling, and provided education to the patient based on review of the above and I  have provided the patient with a written personalized care plan for preventive services.     Vicie Mutters, PA-C   02/25/2016

## 2016-02-25 NOTE — Telephone Encounter (Signed)
82 year old with dementia, daughter brought him in with SOB, CXR showed bilateral pleural effusions, history of diastolic heart failure. Saturations 98% in the office, no chest pain or distress but to safely treat over the weekend, daughter was advised to take the patient to the ER. Need to rule out MI, diastolic HR, infection, cancer.

## 2016-02-25 NOTE — ED Notes (Signed)
Daughter Beverlee Nims [phone]

## 2016-02-25 NOTE — ED Notes (Signed)
Spoke to lab, will add on Hepatic Function

## 2016-02-25 NOTE — ED Provider Notes (Signed)
[facility] DEPT Provider Note   CSN: [id number] Arrival date & time: 02/25/16  1629     History   Chief Complaint Chief Complaint  Patient presents with  . Shortness of Breath    HPI [name] [name] is a 82 y.o. male.  HPI 82 year old male who presents with shortness of breath. He has a history of coronary artery disease, prior stroke, and remote history of prostate/bladder cancer under surveillance. History is provided by both the patient and his daughter who states that he has had several weeks of progressively worsening dyspnea on exertion. States that normally he is able to ambulate around the house and able to check the mail, but now stopping frequently during walking to catch his breath. He denies any lower extremity edema, chest pain, syncope or near syncope. Has had mild nonproductive cough. Denying any orthopnea or PND. No fevers or chills. Was seen by primary care doctor today, and sent for outpatient chest x-ray. He was noted to have bilateral pleural effusions and sent to ED for ongoing management. Past Medical History:  Diagnosis Date  . Depression   . HTN (hypertension)   . Hypothyroid   . Other testicular hypofunction   . Prediabetes   . Prostate cancer ([place])   . Stroke ([place])   . Vitamin D deficiency     Patient Active Problem List   Diagnosis Date Noted  . History of prostate cancer 02/25/2016  . History of bladder cancer 02/25/2016  . Depression, major, in remission ([place]) 02/22/2015  . SDAT (senile dementia of Alzheimer's type) 10/07/2014  . Overactive bladder 03/10/2014  . Hyperlipidemia 04/02/2013  . Medication management 04/02/2013  . Hypothyroid   . Vitamin D deficiency   . Prediabetes   . Testosterone deficiency   . RBBB 12/25/2012  . HTN (hypertension) 12/25/2012    Past Surgical History:  Procedure Laterality Date  . BIOPSY PROSTATE  2/99   pos for adenocarcinoma of prostate  . CYSTOURETHROSCOPY  7/99   and TUR of bladder cancer  .  PROSTATE SURGERY  2007       Home Medications    Prior to Admission medications   Medication Sig Start Date End Date Taking? Authorizing Provider  aspirin EC 81 MG tablet Take 81 mg by mouth daily.   Yes Historical Provider, MD  azithromycin (ZITHROMAX) 250 MG tablet Take 2 tablets (500 mg) on  Day 1,  followed by 1 tablet (250 mg) once daily on Days 2 through 5. 02/25/16 03/01/16 Yes Vicie Mutters, PA-C  azithromycin (ZITHROMAX) 250 MG tablet Take 250 mg by mouth daily.   Yes Historical Provider, MD  cholecalciferol (VITAMIN D) 1000 UNITS tablet Take 1,000 Units by mouth 2 (two) times daily.   Yes Historical Provider, MD  escitalopram (LEXAPRO) 10 MG tablet Take 10 mg by mouth every morning.    Yes Historical Provider, MD  levothyroxine (SYNTHROID, LEVOTHROID) 75 MCG tablet Take 1 tablet (75 mcg total) by mouth daily. 05/26/15  Yes Courtney Forcucci, PA-C  Omega-3 Fatty Acids (FISH OIL PO) Take 1 capsule by mouth daily.   Yes Historical Provider, MD  predniSONE (DELTASONE) 20 MG tablet 2 tablets daily for 3 days, 1 tablet daily for 4 days. 02/25/16  Yes Vicie Mutters, PA-C  QUEtiapine (SEROQUEL) 200 MG tablet Take 200 mg by mouth at bedtime.   Yes Historical Provider, MD    Family History Family History  Problem Relation Age of Onset  . Heart disease Mother   . Other Father  Brain tumor  . Thyroid disease Father     Social History Social History  Substance Use Topics  . Smoking status: Former Smoker    Packs/day: 1.00    Years: 10.00    Types: Cigarettes    Quit date: 12/26/1963  . Smokeless tobacco: Never Used  . Alcohol use No     Allergies   Penicillins   Review of Systems Review of Systems 10/14 systems reviewed and are negative other than those stated in the HPI   Physical Exam Updated Vital Signs BP (!) 156/112   Pulse 77   Temp 97.6 F (36.4 C) (Oral)   Resp (!) 35   SpO2 93%   Physical Exam Physical Exam  Nursing note a non-toxic, and in no  acute distress Head: Normocephalic and atraumatic.  Mouth/Throat: Oropharynx is clear and moist.  Neck: Normal range of motion. Neck supple.  Cardiovascular: Normal rate and regular rhythm.   Pulmonary/Chest: Tachypnea, no significant accessory muscle usage, and breath sounds normal. No lower extremity edema. Abdominal: Soft. There is no tenderness. There is no rebound and no guarding.  Musculoskeletal: Normal range of motion.  Neurological: Alert, no facial droop, fluent speech, moves all extremities symmetrically Skin: Skin is warm and dry.  Psychiatric: Cooperative   ED Treatments / Results  Labs (all labs ordered are listed, but only abnormal results are displayed) Labs Reviewed  BASIC METABOLIC PANEL - Abnormal; Notable for the following:       Result Value   BUN 23 (*)    Creatinine, Ser 1.48 (*)    GFR calc non Af Amer 41 (*)    GFR calc Af Amer 48 (*)    All other components within normal limits  CBC - Abnormal; Notable for the following:    RDW 16.2 (*)    All other components within normal limits  BRAIN NATRIURETIC PEPTIDE - Abnormal; Notable for the following:    B Natriuretic Peptide 1,153.6 (*)    All other components within normal limits  HEPATIC FUNCTION PANEL - Abnormal; Notable for the following:    Total Protein 6.4 (*)    All other components within normal limits  I-STAT TROPOININ, ED    EKG  EKG Interpretation  Date/Time:  Friday February 25 2016 16:49:44 EST Ventricular Rate:  79 PR Interval:  228 QRS Duration: 130 QT Interval:  386 QTC Calculation: 442 R Axis:   131 Text Interpretation:  Sinus rhythm with 1st degree A-V block with frequent Premature ventricular complexes Right bundle branch block Septal infarct , age undetermined Abnormal ECG no prior EKG for comparison  Confirmed by Shizue Kaseman MD, Diavian Furgason [phone]) on 02/25/2016 6:27:46 PM       Radiology Dg Chest 2 View  Result Date: 02/25/2016 CLINICAL DATA:  Right lower lobe decreased breath  sounds. Shortness of breath for 2-3 months. EXAM: CHEST  2 VIEW COMPARISON:  10/14/07 FINDINGS: There is moderate cardiac enlargement. Bilateral pleural effusions are identified right greater than left. Overlying areas of atelectasis are noted. IMPRESSION: 1. Bilateral pleural effusions right greater in left. 2. Bibasilar atelectasis. Electronically Signed   By: Kerby Moors M.D.   On: 02/25/2016 14:56    Procedures Procedures (including critical care time)  Medications Ordered in ED Medications  furosemide (LASIX) injection 20 mg (not administered)     Initial Impression / Assessment and Plan / ED Course  I have reviewed the triage vital signs and the nursing notes.  Pertinent labs & imaging results that were available  during my care of the patient were reviewed by me and considered in my medical decision making (see chart for details).  Clinical Course     63 -year-old male who presents with progressive shortness of breath and dyspnea on exertion. He is nontoxic in no severe distress, but does have tachypnea. Pulse ox in the mid to low 90 percentile. Mild expiratory wheezes on lung exam with breath sounds diminished at the bases. Chest x-ray visualized from outpatient, showing bilateral pleural effusions right greater than left and with evidence of mild interstitial edema. BNP is elevated above 1000, and I suspect potential new onset CHF that could contribute to symptoms. Is given 20 mg of IV Lasix. Seems less likely to be related to infection giving subacute nature of this without fever or leukocytosis. We'll plan to admit for ongoing workup.   Final Clinical Impressions(s) / ED Diagnoses   Final diagnoses:  DOE (dyspnea on exertion)  Bilateral pleural effusion    New Prescriptions New Prescriptions   No medications on file     Forde Dandy, MD 02/25/16 1930

## 2016-02-25 NOTE — ED Triage Notes (Signed)
Per pt family member, sent here from PCP due to EKG and chest xray done which showed fluid on the lungs, also pt c/o SOB for a month.

## 2016-02-25 NOTE — H&P (Addendum)
[name] [name] [id number] DOB: 1927-12-10 DOA: 02/25/2016     PCP: Alesia Richards, MD   Outpatient Specialists: [facility] Patient coming from:    home Lives  With family daughter    Chief Complaint: Shortness of breath   HPI: [name] [name] is a 82 y.o. male with medical history significant of HTN, HLD, Prostate Cancer remote from 1999, dementia, CK D stage II, hypothyroidism and depression    Presented with worsening over past few months progressive shortness of breath he presented to his primary care provider today was found to have EKG with new onset right bundle branch block he noted to have increase gain and minor Mollo extremity edema and orthopnea though chest x-ray showing bilateral pleural effusions for presumed bronchitis she was started on prednisone and azithromycin. Oxygen saturation 98% on room air in the office Family was told to take patient to emerge department. Patient denies any fevers or chills no chest pain his been having progressive dyspnea on exertion as he walks around his room. Haven't had any syncope or near-syncope. He has mild nonproductive cough no fevers or chills.   Family reports he has gained 3 pounds over fast 24 hours only.  Regarding pertinent Chronic problems: Hyperlipidemia controlled with diet medication followed by primary care provider he's been diagnosed with prediabetes 2011 hemoglobin A1c was 5.9 2015   IN ER:  Temp (24hrs), Avg:97.7 F (36.5 C), Min:97.6 F (36.4 C), Max:97.7 F (36.5 C)      RR 35 satting 96% room air HR 76 BP 151/99 Sodium 140 potassium 4.0 creatinine 1.48 which is close to baseline WBC 5.8 hemoglobin 13.5  BNP 1153 Following Medications were ordered in ER: Medications  furosemide (LASIX) injection 20 mg (20 mg Intravenous Given 02/25/16 1930)         Hospitalist was called for admission for Acute respiratory failure secondary to bilateral pleural effusions  Review of Systems:    Pertinent positives include:  shortness of breath at rest. dyspnea on exertion,  non-productive cough,  Constitutional:  No weight loss, night sweats, Fevers, chills, fatigue, weight loss  HEENT:  No headaches, Difficulty swallowing,Tooth/dental problems,Sore throat,  No sneezing, itching, ear ache, nasal congestion, post nasal drip,  Cardio-vascular:  No chest pain, Orthopnea, PND, anasarca, dizziness, palpitations.no Bilateral lower extremity swelling  GI:  No heartburn, indigestion, abdominal pain, nausea, vomiting, diarrhea, change in bowel habits, loss of appetite, melena, blood in stool, hematemesis Resp:     No excess mucus, no productive cough, NoNo coughing up of blood.No change in color of mucus.No wheezing. Skin:  no rash or lesions. No jaundice GU:  no dysuria, change in color of urine, no urgency or frequency. No straining to urinate.  No flank pain.  Musculoskeletal:  No joint pain or no joint swelling. No decreased range of motion. No back pain.  Psych:  No change in mood or affect. No depression or anxiety. No memory loss.  Neuro: no localizing neurological complaints, no tingling, no weakness, no double vision, no gait abnormality, no slurred speech, no confusion  As per HPI otherwise 10 point review of systems negative.   Past Medical History: Past Medical History:  Diagnosis Date  . Depression   . HTN (hypertension)   . Hypothyroid   . Other testicular hypofunction   . Prediabetes   . Prostate cancer ([place])   . Stroke ([place])   . Vitamin D deficiency    Past Surgical History:  Procedure Laterality Date  . BIOPSY  PROSTATE  2/99   pos for adenocarcinoma of prostate  . CYSTOURETHROSCOPY  7/99   and TUR of bladder cancer  . PROSTATE SURGERY  2007     Social History:  Ambulatory  Cane or walker  wheelchair bound, bed bound    reports that he quit smoking about 52 years ago. His smoking use included Cigarettes. He has a 10.00 pack-year smoking history.  He has never used smokeless tobacco. He reports that he does not drink alcohol or use drugs.  Allergies:   Allergies  Allergen Reactions  . Penicillins Hives, Itching, Swelling and Rash    Blisters also       Family History:   Family History  Problem Relation Age of Onset  . Heart disease Mother   . Other Father     Brain tumor  . Thyroid disease Father     Medications: Prior to Admission medications   Medication Sig Start Date End Date Taking? Authorizing Provider  aspirin EC 81 MG tablet Take 81 mg by mouth daily.   Yes Historical Provider, MD  azithromycin (ZITHROMAX) 250 MG tablet Take 2 tablets (500 mg) on  Day 1,  followed by 1 tablet (250 mg) once daily on Days 2 through 5. 02/25/16 03/01/16 Yes Vicie Mutters, PA-C  azithromycin (ZITHROMAX) 250 MG tablet Take 250 mg by mouth daily.   Yes Historical Provider, MD  cholecalciferol (VITAMIN D) 1000 UNITS tablet Take 1,000 Units by mouth 2 (two) times daily.   Yes Historical Provider, MD  escitalopram (LEXAPRO) 10 MG tablet Take 10 mg by mouth every morning.    Yes Historical Provider, MD  levothyroxine (SYNTHROID, LEVOTHROID) 75 MCG tablet Take 1 tablet (75 mcg total) by mouth daily. 05/26/15  Yes Courtney Forcucci, PA-C  Omega-3 Fatty Acids (FISH OIL PO) Take 1 capsule by mouth daily.   Yes Historical Provider, MD  predniSONE (DELTASONE) 20 MG tablet 2 tablets daily for 3 days, 1 tablet daily for 4 days. 02/25/16  Yes Vicie Mutters, PA-C  QUEtiapine (SEROQUEL) 200 MG tablet Take 200 mg by mouth at bedtime.   Yes Historical Provider, MD    Physical Exam: Patient Vitals for the past 24 hrs:  BP Temp Temp src Pulse Resp SpO2  02/25/16 1915 (!) 156/112 - - 77 (!) 35 93 %  02/25/16 1845 (!) 162/109 - - 81 (!) 36 96 %  02/25/16 1830 (!) 151/113 - - 80 (!) 29 92 %  02/25/16 1815 154/87 - - 79 (!) 28 94 %  02/25/16 1800 (!) 146/109 - - 75 (!) 42 94 %  02/25/16 1745 (!) 149/104 - - 79 (!) 31 95 %  02/25/16 1730 (!) 145/103 -  - 79 - 97 %  02/25/16 1715 - - - 77 (!) 34 93 %  02/25/16 1647 (!) 161/101 97.6 F (36.4 C) Oral 76 18 98 %    1. General:  in No Acute distress 2. Psychological: Alert and  Oriented to self 3. Head/ENT:   Moist   Mucous Membranes                          Head Non traumatic, neck supple                            Poor Dentition 4. SKIN: normal  Skin turgor,  Skin clean Dry and intact no rash 5. Heart: Regular rate and rhythm  Murmur  noted, Rub or gallop 6. Lungs: , no wheezes but crackles  Diminished at baseline 7. Abdomen: Soft,    non-tender, Non distended 8. Lower extremities: no clubbing, cyanosis, or edema 9. Neurologically Grossly intact, moving all 4 extremities equally  10. MSK: Normal range of motion   body mass index is unknown because there is no height or weight on file.  Labs on Admission:   Labs on Admission: I have personally reviewed following labs and imaging studies  CBC:  Recent Labs Lab 02/25/16 1701  WBC 5.8  HGB 13.5  HCT 41.3  MCV 85.9  PLT 123XX123   Basic Metabolic Panel:  Recent Labs Lab 02/25/16 1701  NA 140  K 4.0  CL 106  CO2 25  GLUCOSE 95  BUN 23*  CREATININE 1.48*  CALCIUM 9.7   GFR: Estimated Creatinine Clearance: 33.2 mL/min (by C-G formula based on SCr of 1.48 mg/dL (H)). Liver Function Tests:  Recent Labs Lab 02/25/16 1701  AST 41  ALT 31  ALKPHOS 83  BILITOT 0.8  PROT 6.4*  ALBUMIN 3.8   No results for input(s): LIPASE, AMYLASE in the last 168 hours. No results for input(s): AMMONIA in the last 168 hours. Coagulation Profile: No results for input(s): INR, PROTIME in the last 168 hours. Cardiac Enzymes: No results for input(s): CKTOTAL, CKMB, CKMBINDEX, TROPONINI in the last 168 hours. BNP (last 3 results) No results for input(s): PROBNP in the last 8760 hours. HbA1C: No results for input(s): HGBA1C in the last 72 hours. CBG: No results for input(s): GLUCAP in the last 168 hours. Lipid Profile: No results for  input(s): CHOL, HDL, LDLCALC, TRIG, CHOLHDL, LDLDIRECT in the last 72 hours. Thyroid Function Tests: No results for input(s): TSH, T4TOTAL, FREET4, T3FREE, THYROIDAB in the last 72 hours. Anemia Panel: No results for input(s): VITAMINB12, FOLATE, FERRITIN, TIBC, IRON, RETICCTPCT in the last 72 hours.  Sepsis Labs: @LABRCNTIP (procalcitonin:4,lacticidven:4) )No results found for this or any previous visit (from the past 240 hour(s)).     UA  not ordered  Lab Results  Component Value Date   HGBA1C 5.3 01/12/2016    Estimated Creatinine Clearance: 33.2 mL/min (by C-G formula based on SCr of 1.48 mg/dL (H)).  BNP (last 3 results) No results for input(s): PROBNP in the last 8760 hours.   ECG REPORT  Independently reviewed Rate: 79   Rhythm: SR with 1st degree AV block and RBBB ST&T Change: No acute ischemic changes   QTC 442  There were no vitals filed for this visit.   Cultures: No results found for: SDES, [place], CULT, REPTSTATUS   Radiological Exams on Admission: Dg Chest 2 View  Result Date: 02/25/2016 CLINICAL DATA:  Right lower lobe decreased breath sounds. Shortness of breath for 2-3 months. EXAM: CHEST  2 VIEW COMPARISON:  10/14/07 FINDINGS: There is moderate cardiac enlargement. Bilateral pleural effusions are identified right greater than left. Overlying areas of atelectasis are noted. IMPRESSION: 1. Bilateral pleural effusions right greater in left. 2. Bibasilar atelectasis. Electronically Signed   By: Kerby Moors M.D.   On: 02/25/2016 14:56    Chart has been reviewed    Assessment/Plan  82 y.o. male with medical history significant of HTN, HLD, Prostate Cancer remote from 1999, dementia, CK D stage II, hypothyroidism and depression being admitted for Acute respiratory failure secondary to bilateral pleural effusions   Present on Admission:  . Prediabetes Order sliding scale continue to monitor . Pleural effusion - Will diurese and monitorcurrently  comfortable no  indication for paracentesis does not require oxygen able to lay down flat  . Acute respiratory failure (HCC)- likely secondary to bilateral pleural effusion most likely secondary to CHF will obtain echogram to evaluate for valvular abnormality versus systolic dysfunction Given the murmur obtain echogram. Will likely benefit from cardiology consult . CKD (chronic kidney disease) stage 3, GFR 30-59 ml/minCreatinine at baseline we'll continue to monitor while being diuresed. SDAT (senile dementia of Alzheimer's type) - Watch for sundowning currently stable . RBBB-  monitor on tele, obtain echo . HTN (hypertension)Stable not on any home medications continue to monitor hold off on ace inhibitor given chronic kidney disease  Other plan as per orders.  DVT prophylaxis:    Lovenox     Code Status:  FULL CODE as per patient    Family Communication:   Family  at  Bedside  plan of care was discussed with   Daughter   Disposition Plan:      To home once workup is complete and patient is stable                              Consults called: emailed cardiology    Admission status:   inpatient       Level of care    tele       I have spent a total of 56 min on this admission    Kiyomi Pallo 02/25/2016, 9:23 PM    [facility]  Pager [phone]   after 2 AM please page floor coverage PA If 7AM-7PM, please contact the day team taking care of the patient  [email or web address]  Password [email or web address]

## 2016-02-25 NOTE — Patient Instructions (Signed)
Shortness of Breath, Adult °Shortness of breath is when a person has trouble breathing enough air, or when a person feels like she or he is having trouble breathing in enough air. Shortness of breath could be a sign of medical problem. °Follow these instructions at home: °Pay attention to any changes in your symptoms. Take these actions to help with your condition: °· Do not smoke. Smoking is a common cause of shortness of breath. If you smoke and you need help quitting, ask your health care provider. °· Avoid things that can irritate your airways, such as: °¨ Mold. °¨ Dust. °¨ Air pollution. °¨ Chemical fumes. °¨ Things that can cause allergy symptoms (allergens), if you have allergies. °· Keep your living space clean and free of mold and dust. °· Rest as needed. Slowly return to your usual activities. °· Take over-the-counter and prescription medicines, including oxygen and inhaled medicines, only as told by your health care provider. °· Keep all follow-up visits as told by your health care provider. This is important. °Contact a health care provider if: °· Your condition does not improve as soon as expected. °· You have a hard time doing your normal activities, even after you rest. °· You have new symptoms. °Get help right away if: °· Your shortness of breath gets worse. °· You have shortness of breath when you are resting. °· You feel light-headed or you faint. °· You have a cough that is not controlled with medicines. °· You cough up blood. °· You have pain with breathing. °· You have pain in your chest, arms, shoulders, or abdomen. °· You have a fever. °· You cannot walk up stairs or exercise the way that you normally do. °This information is not intended to replace advice given to you by your health care provider. Make sure you discuss any questions you have with your health care provider. °Document Released: 11/15/2000 Document Revised: 09/11/2015 Document Reviewed: 07/29/2015 °Elsevier Interactive Patient  Education © 2017 [employer] ° °

## 2016-02-26 DIAGNOSIS — I4510 Unspecified right bundle-branch block: Secondary | ICD-10-CM

## 2016-02-26 DIAGNOSIS — I10 Essential (primary) hypertension: Secondary | ICD-10-CM

## 2016-02-26 DIAGNOSIS — J90 Pleural effusion, not elsewhere classified: Principal | ICD-10-CM

## 2016-02-26 DIAGNOSIS — N183 Chronic kidney disease, stage 3 (moderate): Secondary | ICD-10-CM

## 2016-02-26 LAB — COMPREHENSIVE METABOLIC PANEL
ALT: 29 U/L (ref 17–63)
AST: 30 U/L (ref 15–41)
Albumin: 3.4 g/dL — ABNORMAL LOW (ref 3.5–5.0)
Alkaline Phosphatase: 75 U/L (ref 38–126)
Anion gap: 8 (ref 5–15)
BUN: 22 mg/dL — ABNORMAL HIGH (ref 6–20)
CALCIUM: 9.1 mg/dL (ref 8.9–10.3)
CHLORIDE: 106 mmol/L (ref 101–111)
CO2: 25 mmol/L (ref 22–32)
CREATININE: 1.39 mg/dL — AB (ref 0.61–1.24)
GFR calc Af Amer: 51 mL/min — ABNORMAL LOW (ref >60)
GFR, EST NON AFRICAN AMERICAN: 44 mL/min — AB (ref >60)
Glucose, Bld: 144 mg/dL — ABNORMAL HIGH (ref 65–99)
Potassium: 3.7 mmol/L (ref 3.5–5.1)
Sodium: 139 mmol/L (ref 135–145)
Total Bilirubin: 1.3 mg/dL — ABNORMAL HIGH (ref 0.3–1.2)
Total Protein: 5.6 g/dL — ABNORMAL LOW (ref 6.5–8.1)

## 2016-02-26 LAB — GLUCOSE, CAPILLARY
GLUCOSE-CAPILLARY: 161 mg/dL — AB (ref 65–99)
GLUCOSE-CAPILLARY: 114 mg/dL — AB (ref 65–99)
GLUCOSE-CAPILLARY: 123 mg/dL — AB (ref 65–99)
Glucose-Capillary: 117 mg/dL — ABNORMAL HIGH (ref 65–99)

## 2016-02-26 LAB — CBC
HCT: 37.5 % — ABNORMAL LOW (ref 39.0–52.0)
Hemoglobin: 12.2 g/dL — ABNORMAL LOW (ref 13.0–17.0)
MCH: 27.6 pg (ref 26.0–34.0)
MCHC: 32.5 g/dL (ref 30.0–36.0)
MCV: 84.8 fL (ref 78.0–100.0)
PLATELETS: 154 10*3/uL (ref 150–400)
RBC: 4.42 MIL/uL (ref 4.22–5.81)
RDW: 16 % — ABNORMAL HIGH (ref 11.5–15.5)
WBC: 3.2 10*3/uL — AB (ref 4.0–10.5)

## 2016-02-26 LAB — TSH: TSH: 1.173 u[IU]/mL (ref 0.350–4.500)

## 2016-02-26 LAB — D-DIMER, QUANTITATIVE: D-Dimer, Quant: 1.43 mcg/mL FEU — ABNORMAL HIGH (ref <0.50)

## 2016-02-26 LAB — PHOSPHORUS: Phosphorus: 3.8 mg/dL (ref 2.5–4.6)

## 2016-02-26 LAB — TROPONIN I
TROPONIN I: 0.07 ng/mL — AB (ref <0.03)
Troponin I: 0.06 ng/mL (ref <0.03)

## 2016-02-26 LAB — MAGNESIUM: MAGNESIUM: 1.8 mg/dL (ref 1.7–2.4)

## 2016-02-26 LAB — BRAIN NATRIURETIC PEPTIDE: B NATRIURETIC PEPTIDE 5: 2021.8 pg/mL — AB (ref 0.0–100.0)

## 2016-02-26 MED ORDER — ENOXAPARIN SODIUM 40 MG/0.4ML ~~LOC~~ SOLN
40.0000 mg | SUBCUTANEOUS | Status: DC
  Administered 2016-02-27 – 2016-02-28 (×2): 40 mg via SUBCUTANEOUS
  Filled 2016-02-26 (×2): qty 0.4

## 2016-02-26 NOTE — Progress Notes (Signed)
PROGRESS NOTE                                                                                                                                                                                                             Patient Demographics:    [name] [name], is a 82 y.o. male, DOB - Apr 02, 1927, [id number]  Admit date - 02/25/2016   Admitting Physician Toy Baker, MD  Outpatient Primary MD for the patient is Alesia Richards, MD  LOS - 1   Chief Complaint  Patient presents with  . Shortness of Breath       Brief Narrative   83 y.o. male with medical history significant of HTN, HLD, Prostate Cancer remote from 1999, dementia, CK D stage II, hypothyroidism and depression, Sent by his PCP for evaluation of bilateral free effusion, and right bundle branch block.    Subjective:    [name] [name] is extremely hard of hearing, he denies any complaints.   Assessment  & Plan :    Active Problems:   RBBB   HTN (hypertension)   Prediabetes   SDAT (senile dementia of Alzheimer's type)   History of prostate cancer   Pleural effusion   Acute respiratory failure (HCC)   CKD (chronic kidney disease) stage 3, GFR 30-59 ml/min  Acute on chronic diastolic heart failure - Most recent 2-D echo 2014 with a preserved EF, grade 1 diastolic dysfunction, presents with progressive dyspnea, has significantly elevated BNP, as well as evidence of pleural effusion or volume overload on chest x-ray - Continue with IV diuresis, check daily weight, strict ins and outs. - Repeat chest x-ray in 24 hours to evaluate for diuresis  Hypertension - Daughter reports his antihypertensive medication(she think he was on Cardizem) has been stopped last August for soft blood pressure, reports blood pressure been acceptable at home.  Prediabetes - follow on  A1c, continue with sliding scale  CKD stage III - Creatinine at baseline, continue to monitor as on IV  diuresis  Hypothyroidism - Continue Synthroid  Depression - Continue with home medication    Code Status : Full  Family Communication  : Daughter at bedside  Disposition Plan  : Home when stable  Consults  :  Cardiology  Procedures  : None  DVT Prophylaxis  :  Lovenox -  Lab Results  Component Value  Date   PLT 154 02/26/2016    Antibiotics  :    Anti-infectives    None        Objective:   Vitals:   02/25/16 2200 02/25/16 2300 02/26/16 0508 02/26/16 1025  BP: (!) 159/105 (!) 154/99 (!) 156/72 120/64  Pulse: 78 76 75 66  Resp:   20 20  Temp:  98.3 F (36.8 C) 97.7 F (36.5 C) 98.5 F (36.9 C)  TempSrc:      SpO2: 90% 96% 92% 94%  Weight:  69.2 kg (152 lb 8 oz) 67.8 kg (149 lb 8 oz)   Height:  5\' 11"  (1.803 m)      Wt Readings from Last 3 Encounters:  02/26/16 67.8 kg (149 lb 8 oz)  02/25/16 73.4 kg (161 lb 12.8 oz)  01/12/16 73.8 kg (162 lb 9.6 oz)     Intake/Output Summary (Last 24 hours) at 02/26/16 1318 Last data filed at 02/26/16 1250  Gross per 24 hour  Intake              480 ml  Output             1900 ml  Net            -1420 ml     Physical Exam  Awake Alert,Extremely hard of hearing  [place].AT,PERRAL Supple Neck,No JVD,  Symmetrical Chest wall movement, diminished air entry at the bases RRR,No Gallops,Rubs or new Murmurs, No Parasternal Heave +ve B.Sounds, Abd Soft, No tenderness,No rebound - guarding or rigidity. No Cyanosis, Clubbing or edema, No new Rash or bruise     Data Review:    CBC  Recent Labs Lab 02/25/16 1701 02/26/16 0313  WBC 5.8 3.2*  HGB 13.5 12.2*  HCT 41.3 37.5*  PLT 165 154  MCV 85.9 84.8  MCH 28.1 27.6  MCHC 32.7 32.5  RDW 16.2* 16.0*    Chemistries   Recent Labs Lab 02/25/16 1701 02/26/16 0313  NA 140 139  K 4.0 3.7  CL 106 106  CO2 25 25  GLUCOSE 95 144*  BUN 23* 22*  CREATININE 1.48* 1.39*  CALCIUM 9.7 9.1  MG  --  1.8  AST 41 30  ALT 31 29  ALKPHOS 83 75  BILITOT 0.8 1.3*    ------------------------------------------------------------------------------------------------------------------ No results for input(s): CHOL, HDL, LDLCALC, TRIG, CHOLHDL, LDLDIRECT in the last 72 hours.  Lab Results  Component Value Date   HGBA1C 5.3 01/12/2016   ------------------------------------------------------------------------------------------------------------------  Recent Labs  02/26/16 0313  TSH 1.173   ------------------------------------------------------------------------------------------------------------------ No results for input(s): VITAMINB12, FOLATE, FERRITIN, TIBC, IRON, RETICCTPCT in the last 72 hours.  Coagulation profile No results for input(s): INR, PROTIME in the last 168 hours.   Recent Labs  02/25/16 1213  DDIMER 1.43*    Cardiac Enzymes  Recent Labs Lab 02/25/16 2256 02/26/16 0313 02/26/16 1015  TROPONINI 0.06* 0.06* 0.07*   ------------------------------------------------------------------------------------------------------------------    Component Value Date/Time   BNP 2,021.8 (H) 02/26/2016 0313    Inpatient Medications  Scheduled Meds: . aspirin EC  81 mg Oral Daily  . enoxaparin (LOVENOX) injection  30 mg Subcutaneous Q24H  . escitalopram  10 mg Oral [id number]  . furosemide  40 mg Intravenous BID  . insulin aspart  0-5 Units Subcutaneous QHS  . insulin aspart  0-9 Units Subcutaneous TID WC  . levothyroxine  75 mcg Oral QAC breakfast  . QUEtiapine  200 mg Oral QHS  . sodium chloride flush  3 mL Intravenous  Q12H  . sodium chloride flush  3 mL Intravenous Q12H   Continuous Infusions: PRN Meds:.sodium chloride, acetaminophen, HYDROcodone-acetaminophen, ondansetron **OR** ondansetron (ZOFRAN) IV, polyethylene glycol, sodium chloride flush  Micro Results No results found for this or any previous visit (from the past 240 hour(s)).  Radiology Reports Dg Chest 2 View  Result Date: 02/25/2016 CLINICAL DATA:  Right  lower lobe decreased breath sounds. Shortness of breath for 2-3 months. EXAM: CHEST  2 VIEW COMPARISON:  10/14/07 FINDINGS: There is moderate cardiac enlargement. Bilateral pleural effusions are identified right greater than left. Overlying areas of atelectasis are noted. IMPRESSION: 1. Bilateral pleural effusions right greater in left. 2. Bibasilar atelectasis. Electronically Signed   By: Kerby Moors M.D.   On: 02/25/2016 14:56     Leahna Hewson M.D on 02/26/2016 at 1:18 PM  Between 7am to 7pm - Pager - [phone]  After 7pm go to [email or web address] - password [email or web address]  [facility] -  Office  [phone]

## 2016-02-26 NOTE — Progress Notes (Signed)
Daughter is refusing from me to given pt insulin. MD made aware. No new orders.

## 2016-02-26 NOTE — Progress Notes (Signed)
CRITICAL VALUE ALERT  Critical value received:  Troponin  Date of notification: 02/26/16  Time of notification:  2347  Critical value read back: yes  Nurse who received alert:  Hermina Barters  MD notified (1st page):  Walden Field  Time of first page:  0025  Time MD responded:  [phone]

## 2016-02-26 NOTE — Consult Note (Signed)
Reason for Consult: CHF  Requesting Physician: Dr Waldron Labs  HPI: [name] [name] is an 82 year old mildly overweight widowed African-American male father of one daughter who lives alone. He has seen Dr. Johnsie Cancel back in 2014. At that time he had a left bundle-branch block. Prior 2-D echocardiogram performed 01/09/13 showed normal LV function with grade 1 diastolic dysfunction. His other problems include hypertension and hyperlipidemia. He has never had a heart attack or stroke. He denies chest pain but does admit to some shortness of breath. He saw his PCP because of "not feeling well and weakness". An EKG showed right bundle branch block and he was admitted for further evaluation. His BNP is 2000. His chest x-ray shows bilateral pleural effusions right greater than left. He has no other signs of congestive heart failure.   Problem List: Patient Active Problem List   Diagnosis Date Noted  . History of prostate cancer 02/25/2016  . History of bladder cancer 02/25/2016  . Pleural effusion 02/25/2016  . Acute respiratory failure ([place]) 02/25/2016  . CKD (chronic kidney disease) stage 3, GFR 30-59 ml/min 02/25/2016  . Depression, major, in remission ([place]) 02/22/2015  . SDAT (senile dementia of Alzheimer's type) 10/07/2014  . Overactive bladder 03/10/2014  . Hyperlipidemia 04/02/2013  . Medication management 04/02/2013  . Hypothyroid   . Vitamin D deficiency   . Prediabetes   . Testosterone deficiency   . RBBB 12/25/2012  . HTN (hypertension) 12/25/2012    PMHx:  Past Medical History:  Diagnosis Date  . Depression   . HTN (hypertension)   . Hypothyroid   . Other testicular hypofunction   . Prediabetes   . Prostate cancer ([place])   . Stroke ([place])   . Vitamin D deficiency    Past Surgical History:  Procedure Laterality Date  . BIOPSY PROSTATE  2/99   pos for adenocarcinoma of prostate  . CYSTOURETHROSCOPY  7/99   and TUR of bladder cancer  . PROSTATE SURGERY  2007     FAMHx: Family History  Problem Relation Age of Onset  . Heart disease Mother   . Other Father     Brain tumor  . Thyroid disease Father     SOCHx:  reports that he quit smoking about 52 years ago. His smoking use included Cigarettes. He has a 10.00 pack-year smoking history. He has never used smokeless tobacco. He reports that he does not drink alcohol or use drugs.  ALLERGIES: Allergies  Allergen Reactions  . Penicillins Hives, Itching, Swelling and Rash    Blisters also    ROS: Pertinent items are noted in HPI.  HOME MEDICATIONS: Prescriptions Prior to Admission  Medication Sig Dispense Refill Last Dose  . aspirin EC 81 MG tablet Take 81 mg by mouth daily.   02/25/2016 at Unknown time  . azithromycin (ZITHROMAX) 250 MG tablet Take 2 tablets (500 mg) on  Day 1,  followed by 1 tablet (250 mg) once daily on Days 2 through 5. 6 each 1 02/25/2016 at Unknown time  . azithromycin (ZITHROMAX) 250 MG tablet Take 250 mg by mouth daily.   02/25/2016 at Unknown time  . cholecalciferol (VITAMIN D) 1000 UNITS tablet Take 1,000 Units by mouth 2 (two) times daily.   02/25/2016 at Unknown time  . escitalopram (LEXAPRO) 10 MG tablet Take 10 mg by mouth every morning.    02/25/2016 at Unknown time  . levothyroxine (SYNTHROID, LEVOTHROID) 75 MCG tablet Take 1 tablet (75 mcg total) by mouth daily. 90 tablet 1  02/25/2016 at Unknown time  . Omega-3 Fatty Acids (FISH OIL PO) Take 1 capsule by mouth daily.   02/25/2016 at Unknown time  . predniSONE (DELTASONE) 20 MG tablet 2 tablets daily for 3 days, 1 tablet daily for 4 days. 10 tablet 0 02/25/2016 at Unknown time  . QUEtiapine (SEROQUEL) 200 MG tablet Take 200 mg by mouth at bedtime.   02/24/2016 at Unknown time    HOSPITAL MEDICATIONS: I have reviewed the patient's current medications.  VITALS: Blood pressure 120/64, pulse 66, temperature 98.5 F (36.9 C), resp. rate 20, height 5\' 11"  (1.803 m), weight 149 lb 8 oz (67.8 kg), SpO2 94  %.  INPUT/OUTPUT I/O last 3 completed shifts: In: -  Out: 1100 [Urine:1100] Total I/O In: 480 [P.O.:480] Out: 800 [Urine:800]    PHYSICAL EXAM: General appearance: alert and no distress Neck: no adenopathy, no carotid bruit, no JVD, supple, symmetrical, trachea midline and thyroid not enlarged, symmetric, no tenderness/mass/nodules Lungs: Decreased breath sounds at the bases right greater than left. Heart: regular rate and rhythm, S1, S2 normal, no murmur, click, rub or gallop Extremities: extremities normal, atraumatic, no cyanosis or edema  LABS:  BMP  Recent Labs  01/12/16 1236 02/25/16 1701 02/26/16 0313  NA 141 140 139  K 4.3 4.0 3.7  CL 106 106 106  CO2 23 25 25   GLUCOSE 95 95 144*  BUN 24 23* 22*  CREATININE 1.33* 1.48* 1.39*  CALCIUM 9.4 9.7 9.1  GFRNONAA 48* 41* 44*  GFRAA 56* 48* 51*    CBC  Recent Labs Lab 02/26/16 0313  WBC 3.2*  RBC 4.42  HGB 12.2*  HCT 37.5*  PLT 154  MCV 84.8    HEMOGLOBIN A1C Lab Results  Component Value Date   HGBA1C 5.3 01/12/2016   MPG 105 01/12/2016    Cardiac Panel (last 3 results)  Recent Labs  02/25/16 2256 02/26/16 0313 02/26/16 1015  TROPONINI 0.06* 0.06* 0.07*    BNP (last 3 results) No results for input(s): PROBNP in the last 8760 hours.  TSH  Recent Labs  10/06/15 1625 01/12/16 1236 02/26/16 0313  TSH 2.63 4.29 1.173    CHOLESTEROL No results for input(s): CHOL in the last 8760 hours.  Hepatic Function Panel  Recent Labs  10/06/15 1625 01/12/16 1236 02/25/16 1701 02/26/16 0313  PROT 6.3 6.0* 6.4* 5.6*  ALBUMIN 3.9 3.8 3.8 3.4*  AST 13 21 41 30  ALT 13 18 31 29   ALKPHOS 71 75 83 75  BILITOT 1.0 0.7 0.8 1.3*  BILIDIR 0.2 0.2 0.3  --   IBILI 0.8 0.5 0.5  --    EKG-normal sinus rhythm at 71 with right bundle branch block. I personally reviewed this EKG   IMAGING: Dg Chest 2 View  Result Date: 02/25/2016 CLINICAL DATA:  Right lower lobe decreased breath sounds.  Shortness of breath for 2-3 months. EXAM: CHEST  2 VIEW COMPARISON:  10/14/07 FINDINGS: There is moderate cardiac enlargement. Bilateral pleural effusions are identified right greater than left. Overlying areas of atelectasis are noted. IMPRESSION: 1. Bilateral pleural effusions right greater in left. 2. Bibasilar atelectasis. Electronically Signed   By: Kerby Moors M.D.   On: 02/25/2016 14:56    IMPRESSION: 1. Hypertension-blood pressures are mildly elevated today. He does not appear to be on antihypertensive medications as an outpatient. His serum creatinine is 1.39 and he is getting diuresed. A low-dose calcium channel blocker may be a good antihypertensive medication with him depending on his 2-D echocardiogram results  2. Congestive heart failure-the patient has bilateral pleural effusions. His BNP is 2000 he does not have other signs or symptoms of volume overloaded. He is Getting diuresed with IV Lasix. We will recheck a 2-D echocardiogram and follow closely.   RECOMMENDATION: 1. 2-D echocardiogram 2. IV diuresis 3. Antihypertensive medication therapy  Time Spent Directly with Patient: 30 minutes  Quay Burow 02/26/2016, 1:08 PM

## 2016-02-27 ENCOUNTER — Inpatient Hospital Stay (HOSPITAL_COMMUNITY): Payer: Medicare Other

## 2016-02-27 DIAGNOSIS — I509 Heart failure, unspecified: Secondary | ICD-10-CM

## 2016-02-27 DIAGNOSIS — I5042 Chronic combined systolic (congestive) and diastolic (congestive) heart failure: Secondary | ICD-10-CM

## 2016-02-27 DIAGNOSIS — I5041 Acute combined systolic (congestive) and diastolic (congestive) heart failure: Secondary | ICD-10-CM

## 2016-02-27 LAB — HEMOGLOBIN A1C
HEMOGLOBIN A1C: 5.6 % (ref 4.8–5.6)
Mean Plasma Glucose: 114 mg/dL

## 2016-02-27 LAB — GLUCOSE, CAPILLARY
Glucose-Capillary: 110 mg/dL — ABNORMAL HIGH (ref 65–99)
Glucose-Capillary: 95 mg/dL (ref 65–99)
Glucose-Capillary: 111 mg/dL — ABNORMAL HIGH (ref 65–99)

## 2016-02-27 LAB — BASIC METABOLIC PANEL
Anion gap: 11 (ref 5–15)
BUN: 34 mg/dL — AB (ref 6–20)
CHLORIDE: 101 mmol/L (ref 101–111)
CO2: 28 mmol/L (ref 22–32)
CREATININE: 1.62 mg/dL — AB (ref 0.61–1.24)
Calcium: 9.3 mg/dL (ref 8.9–10.3)
GFR calc Af Amer: 43 mL/min — ABNORMAL LOW (ref >60)
GFR calc non Af Amer: 37 mL/min — ABNORMAL LOW (ref >60)
Glucose, Bld: 105 mg/dL — ABNORMAL HIGH (ref 65–99)
Potassium: 3.7 mmol/L (ref 3.5–5.1)
Sodium: 140 mmol/L (ref 135–145)

## 2016-02-27 LAB — ECHOCARDIOGRAM COMPLETE
HEIGHTINCHES: 71 in
WEIGHTICAEL: 2403.2 [oz_av]

## 2016-02-27 LAB — CBC
HEMATOCRIT: 38.8 % — AB (ref 39.0–52.0)
HEMOGLOBIN: 12.5 g/dL — AB (ref 13.0–17.0)
MCH: 28 pg (ref 26.0–34.0)
MCHC: 32.2 g/dL (ref 30.0–36.0)
MCV: 86.8 fL (ref 78.0–100.0)
Platelets: 145 10*3/uL — ABNORMAL LOW (ref 150–400)
RBC: 4.47 MIL/uL (ref 4.22–5.81)
RDW: 16.6 % — ABNORMAL HIGH (ref 11.5–15.5)
WBC: 6.5 10*3/uL (ref 4.0–10.5)

## 2016-02-27 MED ORDER — FUROSEMIDE 10 MG/ML IJ SOLN
40.0000 mg | Freq: Once | INTRAMUSCULAR | Status: AC
  Administered 2016-02-27: 40 mg via INTRAVENOUS
  Filled 2016-02-27: qty 4

## 2016-02-27 NOTE — Progress Notes (Signed)
Patient Name: [name] [name] Date of Encounter: 02/27/2016  Primary Cardiologist: Dr. Jenkins Rouge in 2014  Subjective   No chest pain or SOB, just no energy.  No real change from admit.  Inpatient Medications    Scheduled Meds: . aspirin EC  81 mg Oral Daily  . enoxaparin (LOVENOX) injection  40 mg Subcutaneous Q24H  . escitalopram  10 mg Oral BH-q7a  . insulin aspart  0-5 Units Subcutaneous QHS  . insulin aspart  0-9 Units Subcutaneous TID WC  . levothyroxine  75 mcg Oral QAC breakfast  . QUEtiapine  200 mg Oral QHS  . sodium chloride flush  3 mL Intravenous Q12H  . sodium chloride flush  3 mL Intravenous Q12H    PRN Meds: sodium chloride, acetaminophen, HYDROcodone-acetaminophen, ondansetron **OR** ondansetron (ZOFRAN) IV, polyethylene glycol, sodium chloride flush   Vital Signs    Vitals:   02/26/16 1300 02/26/16 1949 02/27/16 0010 02/27/16 0615  BP: 116/63 133/62 107/61 123/67  Pulse: 72 77 78 66  Resp: 20 20 20 20   Temp: 97.5 F (36.4 C) 98.3 F (36.8 C) 98 F (36.7 C) 97.5 F (36.4 C)  TempSrc: Oral Oral Oral Oral  SpO2: 99% 96% 94% 99%  Weight:    150 lb 3.2 oz (68.1 kg)  Height:        Intake/Output Summary (Last 24 hours) at 02/27/16 0928 Last data filed at 02/27/16 0615  Gross per 24 hour  Intake              480 ml  Output             1700 ml  Net            -1220 ml   Filed Weights   02/25/16 2300 02/26/16 0508 02/27/16 0615  Weight: 152 lb 8 oz (69.2 kg) 149 lb 8 oz (67.8 kg) 150 lb 3.2 oz (68.1 kg)    Physical Exam   GEN:  Frail male, in no acute distress.  Neck: Supple, no JVD, carotid bruits, or masses. Cardiac: RRR, [id number] systolic murmur, no rubs, or gallops. No clubbing, cyanosis, edema.  Radials/DP/PT 2+ and equal bilaterally.  Respiratory:  Respirations regular and unlabored,  Bilateral breath sounds with rales scattered throughout, no rhonchi or wheezes. GI: Abd -Soft, nontender, nondistended, BS + x 4.  Labs    CBC  Recent  Labs  02/26/16 0313 02/27/16 0343  WBC 3.2* 6.5  HGB 12.2* 12.5*  HCT 37.5* 38.8*  MCV 84.8 86.8  PLT 154 [id number]*   Basic Metabolic Panel  Recent Labs  02/26/16 0313 02/27/16 0343  NA 139 140  K 3.7 3.7  CL 106 101  CO2 25 28  GLUCOSE 144* 105*  BUN 22* 34*  CREATININE 1.39* 1.62*  CALCIUM 9.1 9.3  MG 1.8  --   PHOS 3.8  --    Liver Function Tests  Recent Labs  02/25/16 1701 02/26/16 0313  AST 41 30  ALT 31 29  ALKPHOS 83 75  BILITOT 0.8 1.3*  PROT 6.4* 5.6*  ALBUMIN 3.8 3.4*   Cardiac Enzymes  Recent Labs  02/25/16 2256 02/26/16 0313 02/26/16 1015  TROPONINI 0.06* 0.06* 0.07*   D-Dimer  Recent Labs  02/25/16 1213  DDIMER 1.43*   Thyroid Function Tests  Recent Labs  02/26/16 0313  TSH 1.173    Telemetry    SR - Personally Reviewed  Radiology    Dg Chest 2 View  Result Date: 02/25/2016 CLINICAL  DATA:  Right lower lobe decreased breath sounds. Shortness of breath for 2-3 months. EXAM: CHEST  2 VIEW COMPARISON:  10/14/07 FINDINGS: There is moderate cardiac enlargement. Bilateral pleural effusions are identified right greater than left. Overlying areas of atelectasis are noted. IMPRESSION: 1. Bilateral pleural effusions right greater in left. 2. Bibasilar atelectasis. Electronically Signed   By: Kerby Moors M.D.   On: 02/25/2016 14:56    Cardiac Studies   ECHO pending.  Patient Profile     82 year old mildly overweight widowed African-American male who lives alone. He has seen Dr. Johnsie Cancel back in 2014. At that time he had a Rt bundle-branch block. Prior 2-D echocardiogram performed 01/09/13 showed normal LV function with grade 1 diastolic dysfunction. His other problems include hypertension and hyperlipidemia. He has never had a heart attack or stroke. He denies chest pain but does admit to some shortness of breath. He saw his PCP because of "not feeling well and weakness". An EKG showed right bundle branch block and he was admitted for  further evaluation. His BNP is 2000. His chest x-ray shows bilateral pleural effusions right greater than left. He has no other signs of congestive heart failure.  Assessment & Plan    1. Congestive heart failure-the patient has bilateral pleural effusions. His BNP is 2000 he does not have other signs or symptoms of volume overloaded. He is getting diuresed with IV Lasix.  He is down 2, 340 and wt is down from 152.8 to 150.3.   We will recheck a 2-D echocardiogram and follow closely.  2. Hypertension-blood pressures stable today. He does not appear to be on antihypertensive medications as an outpatient. His serum creatinine is 1.39 and he is getting diuresed. Per Dr. Gwenlyn Found "A low-dose calcium channel blocker may be a good antihypertensive medication with him depending on his 2-D echocardiogram results"- currently no antihypertensive meds.   3. Murmur- awaiting echo  Signed, Cecilie Kicks, NP  02/27/2016, 9:28 AM  [place] Pager [phone]  After 5 or weekends [phone]   Attending note:    Patient seen and examined. Reviewed consultation note per Dr. Gwenlyn Found and discussed with Ms. Ingold NP. No major changes overall as yet, although diuresis is being achieved with IV Lasix, approximately 2300 cc/24 hours. Creatinine stable. Plan is to follow up on echocardiogram to quantify LVEF and further assess murmur, then can better make medication adjustments from there.  Satira Sark, M.D., F.A.C.C.

## 2016-02-27 NOTE — Progress Notes (Signed)
PROGRESS NOTE                                                                                                                                                                                                             Patient Demographics:    [name] [name], is a 82 y.o. male, DOB - 1927/05/11, [id number]  Admit date - 02/25/2016   Admitting Physician Toy Baker, MD  Outpatient Primary MD for the patient is Alesia Richards, MD  LOS - 2   Chief Complaint  Patient presents with  . Shortness of Breath       Brief Narrative   82 y.o. male with medical history significant of HTN, HLD, Prostate Cancer remote from 1999, dementia, CK D stage II, hypothyroidism and depression, Sent by his PCP for evaluation of bilateral free effusion, and right bundle branch block.    Subjective:    Goerge [name] is extremely hard of hearing, he denies any complaints.   Assessment  & Plan :    Active Problems:   RBBB   HTN (hypertension)   Prediabetes   SDAT (senile dementia of Alzheimer's type)   History of prostate cancer   Pleural effusion   Acute respiratory failure ([facility])   CKD (chronic kidney disease) stage 3, GFR 30-59 ml/min  Acute on chronic diastolic heart failure - Most recent 2-D echo 2014 with a preserved EF, grade 1 diastolic dysfunction, presents with progressive dyspnea, has significantly elevated BNP, as well as evidence of pleural effusion or volume overload on chest x-ray - Diuresis per cardiology, on IV Lasix, and good response with I/O 2.3 L over last 24 hours, but creatinine is 1.6 today, so will hold Lasix today - Chest x-ray repeated this a.m. showing no change in bilateral pleural  Hypertension - Daughter reports his antihypertensive medication(she think he was on Cardizem) has been stopped last August for soft blood pressure, reports blood pressure been acceptable at home.  Prediabetes -  A1c is 5.6, CBGs are  controlled  CKD stage III - Creatinine increased to 1.6 today, will continue to hold Lasix  Hypothyroidism - Continue Synthroid  Depression - Continue with home medication    Code Status : Full  Family Communication  : None at bedside  Disposition Plan  : Home when stable  Consults  :  Cardiology  Procedures  :  None  DVT Prophylaxis  :  Lovenox -  Lab Results  Component Value Date   PLT 145 (L) 02/27/2016    Antibiotics  :    Anti-infectives    None        Objective:   Vitals:   02/26/16 1300 02/26/16 1949 02/27/16 0010 02/27/16 0615  BP: 116/63 133/62 107/61 123/67  Pulse: 72 77 78 66  Resp: 20 20 20 20   Temp: 97.5 F (36.4 C) 98.3 F (36.8 C) 98 F (36.7 C) 97.5 F (36.4 C)  TempSrc: Oral Oral Oral Oral  SpO2: 99% 96% 94% 99%  Weight:    68.1 kg (150 lb 3.2 oz)  Height:        Wt Readings from Last 3 Encounters:  02/27/16 68.1 kg (150 lb 3.2 oz)  02/25/16 73.4 kg (161 lb 12.8 oz)  01/12/16 73.8 kg (162 lb 9.6 oz)     Intake/Output Summary (Last 24 hours) at 02/27/16 1125 Last data filed at 02/27/16 0900  Gross per 24 hour  Intake              840 ml  Output             1700 ml  Net             -860 ml     Physical Exam  Awake Alert,Extremely hard of hearing  [place].AT,PERRAL Supple Neck,No JVD,  Symmetrical Chest wall movement, diminished air entry at the bases RRR,No Gallops,Rubs or new Murmurs, No Parasternal Heave +ve B.Sounds, Abd Soft, No tenderness,No rebound - guarding or rigidity. No Cyanosis, Clubbing or edema, No new Rash or bruise     Data Review:    CBC  Recent Labs Lab 02/25/16 1701 02/26/16 0313 02/27/16 0343  WBC 5.8 3.2* 6.5  HGB 13.5 12.2* 12.5*  HCT 41.3 37.5* 38.8*  PLT 165 154 145*  MCV 85.9 84.8 86.8  MCH 28.1 27.6 28.0  MCHC 32.7 32.5 32.2  RDW 16.2* 16.0* 16.6*    Chemistries   Recent Labs Lab 02/25/16 1701 02/26/16 0313 02/27/16 0343  NA 140 139 140  K 4.0 3.7 3.7  CL 106 106 101  CO2  25 25 28   GLUCOSE 95 144* 105*  BUN 23* 22* 34*  CREATININE 1.48* 1.39* 1.62*  CALCIUM 9.7 9.1 9.3  MG  --  1.8  --   AST 41 30  --   ALT 31 29  --   ALKPHOS 83 75  --   BILITOT 0.8 1.3*  --    ------------------------------------------------------------------------------------------------------------------ No results for input(s): CHOL, HDL, LDLCALC, TRIG, CHOLHDL, LDLDIRECT in the last 72 hours.  Lab Results  Component Value Date   HGBA1C 5.6 02/26/2016   ------------------------------------------------------------------------------------------------------------------  Recent Labs  02/26/16 0313  TSH 1.173   ------------------------------------------------------------------------------------------------------------------ No results for input(s): VITAMINB12, FOLATE, FERRITIN, TIBC, IRON, RETICCTPCT in the last 72 hours.  Coagulation profile No results for input(s): INR, PROTIME in the last 168 hours.   Recent Labs  02/25/16 1213  DDIMER 1.43*    Cardiac Enzymes  Recent Labs Lab 02/25/16 2256 02/26/16 0313 02/26/16 1015  TROPONINI 0.06* 0.06* 0.07*   ------------------------------------------------------------------------------------------------------------------    Component Value Date/Time   BNP 2,021.8 (H) 02/26/2016 0313    Inpatient Medications  Scheduled Meds: . aspirin EC  81 mg Oral Daily  . enoxaparin (LOVENOX) injection  40 mg Subcutaneous Q24H  . escitalopram  10 mg Oral BH-q7a  . insulin aspart  0-5 Units Subcutaneous QHS  .  insulin aspart  0-9 Units Subcutaneous TID WC  . levothyroxine  75 mcg Oral QAC breakfast  . QUEtiapine  200 mg Oral QHS  . sodium chloride flush  3 mL Intravenous Q12H  . sodium chloride flush  3 mL Intravenous Q12H   Continuous Infusions: PRN Meds:.sodium chloride, acetaminophen, HYDROcodone-acetaminophen, ondansetron **OR** ondansetron (ZOFRAN) IV, polyethylene glycol, sodium chloride flush  Micro Results No  results found for this or any previous visit (from the past 240 hour(s)).  Radiology Reports Dg Chest 2 View  Result Date: 02/27/2016 CLINICAL DATA:  Pleural effusion evaluation. EXAM: CHEST  2 VIEW COMPARISON:  02/25/2016 FINDINGS: Examination demonstrates evidence of moderate size bilateral pleural effusions right greater than left without significant change. Likely associated atelectasis in the lung bases. Cannot exclude infection in the lung bases. Mild prominence of the right hilar region. Stable cardiomegaly. Calcified plaque over the thoracic aorta. Degenerative change of the spine. Mild compression fracture in the region of the thoracolumbar junction unchanged. IMPRESSION: Evidence of moderate size bilateral pleural effusions right greater than left likely with associated basilar atelectasis unchanged. Cannot exclude infection in the lung bases. Mild prominence of the right hilum. Recommend follow-up to resolution. Cardiomegaly. Aortic atherosclerosis. Stable compression fracture in the region of the thoracolumbar junction. Electronically Signed   By: Marin Olp M.D.   On: 02/27/2016 10:07   Dg Chest 2 View  Result Date: 02/25/2016 CLINICAL DATA:  Right lower lobe decreased breath sounds. Shortness of breath for 2-3 months. EXAM: CHEST  2 VIEW COMPARISON:  10/14/07 FINDINGS: There is moderate cardiac enlargement. Bilateral pleural effusions are identified right greater than left. Overlying areas of atelectasis are noted. IMPRESSION: 1. Bilateral pleural effusions right greater in left. 2. Bibasilar atelectasis. Electronically Signed   By: Kerby Moors M.D.   On: 02/25/2016 14:56     Falisha Osment M.D on 02/27/2016 at 11:25 AM  Between 7am to 7pm - Pager - [phone]  After 7pm go to [email or web address] - password [facility]  [facility] -  Office  [phone]

## 2016-02-28 DIAGNOSIS — I313 Pericardial effusion (noninflammatory): Secondary | ICD-10-CM

## 2016-02-28 DIAGNOSIS — I5041 Acute combined systolic (congestive) and diastolic (congestive) heart failure: Secondary | ICD-10-CM

## 2016-02-28 DIAGNOSIS — I429 Cardiomyopathy, unspecified: Secondary | ICD-10-CM

## 2016-02-28 LAB — BASIC METABOLIC PANEL
Anion gap: 6 (ref 5–15)
BUN: 31 mg/dL — ABNORMAL HIGH (ref 6–20)
CHLORIDE: 100 mmol/L — AB (ref 101–111)
CO2: 33 mmol/L — AB (ref 22–32)
CREATININE: 1.69 mg/dL — AB (ref 0.61–1.24)
Calcium: 9 mg/dL (ref 8.9–10.3)
GFR calc non Af Amer: 35 mL/min — ABNORMAL LOW (ref >60)
GFR, EST AFRICAN AMERICAN: 41 mL/min — AB (ref >60)
Glucose, Bld: 89 mg/dL (ref 65–99)
POTASSIUM: 3.4 mmol/L — AB (ref 3.5–5.1)
SODIUM: 139 mmol/L (ref 135–145)

## 2016-02-28 MED ORDER — ENOXAPARIN SODIUM 30 MG/0.3ML ~~LOC~~ SOLN
30.0000 mg | SUBCUTANEOUS | Status: DC
  Administered 2016-02-29: 30 mg via SUBCUTANEOUS
  Filled 2016-02-28: qty 0.3

## 2016-02-28 MED ORDER — FUROSEMIDE 10 MG/ML IJ SOLN
40.0000 mg | Freq: Once | INTRAMUSCULAR | Status: AC
  Administered 2016-02-28: 40 mg via INTRAVENOUS
  Filled 2016-02-28: qty 4

## 2016-02-28 MED ORDER — POTASSIUM CHLORIDE CRYS ER 20 MEQ PO TBCR
40.0000 meq | EXTENDED_RELEASE_TABLET | Freq: Once | ORAL | Status: AC
Start: 2016-02-28 — End: 2016-02-28
  Administered 2016-02-28: 40 meq via ORAL
  Filled 2016-02-28: qty 2

## 2016-02-28 NOTE — Progress Notes (Signed)
PROGRESS NOTE                                                                                                                                                                                                             Patient Demographics:    [name] [name], is a 82 y.o. male, DOB - 1927-04-13, [id number]  Admit date - 02/25/2016   Admitting Physician Toy Baker, MD  Outpatient Primary MD for the patient is Alesia Richards, MD  LOS - 3   Chief Complaint  Patient presents with  . Shortness of Breath       Brief Narrative   82 y.o. male with medical history significant of HTN, HLD, Prostate Cancer remote from 1999, dementia, CK D stage II, hypothyroidism and depression, Sent by his PCP for evaluation of bilateral free effusion, and right bundle branch block.    Subjective:    [name] [name] is extremely hard of hearing, he denies any complaints.   Assessment  & Plan :    Active Problems:   RBBB   HTN (hypertension)   Prediabetes   SDAT (senile dementia of Alzheimer's type)   History of prostate cancer   Bilateral pleural effusion   Acute respiratory failure (HCC)   CKD (chronic kidney disease) stage 3, GFR 30-59 ml/min   Acute on chronic diastolic congestive heart failure (HCC)  Acute on chronic Combined systolic and diastolic heart failure - Most recent 2-D echo 2014 with a preserved EF, grade 1 diastolic dysfunction, presents with progressive dyspnea, has significantly elevated BNP, as well as evidence of pleural effusion or volume overload on chest x-ray - Repeat 2-D echo showing EF 40%, severe hypokinesis in the inferior myocardium, increased left ventricular diastolic compliance. - Diuresis per cardiology, dose based on clinical exam and renal function, received 40 mg IV Lasix daily, continue to monitor renal function closely, , in/out -3.6 L since admission . - Chest x-ray still showing pleural effusion even after  diuresis.  Pericardial effusion - Cardiology input appreciated, moderate on echo without tamponade, accommodation for repeat echo as an outpatient  Hypertension - Daughter reports his antihypertensive medication(she think he was on Cardizem) has been stopped last August for soft blood pressure, reports blood pressure been acceptable at home.  Prediabetes -  A1c is 5.6, CBGs are controlled  CKD stage III - Creatinine increased  to 1.6 today, will dose Lasix as needed  Hypothyroidism - Continue Synthroid  Depression - Continue with home medication    Code Status : DO NOT RESUSCITATE, confirmed by patient  Family Communication  : None at bedside  Disposition Plan  : Will need PT consult, daughter plans to take patient home  Consults  :  Cardiology  Procedures  : None  DVT Prophylaxis  :  Lovenox -  Lab Results  Component Value Date   PLT 145 (L) 02/27/2016    Antibiotics  :    Anti-infectives    None        Objective:   Vitals:   02/27/16 0615 02/27/16 1200 02/27/16 2030 02/28/16 0516  BP: 123/67 126/73 137/76 114/85  Pulse: 66 66 68 (!) 58  Resp: 20 20 20 16   Temp: 97.5 F (36.4 C) 97.4 F (36.3 C) 97.9 F (36.6 C) 97.5 F (36.4 C)  TempSrc: Oral Oral Oral Oral  SpO2: 99% 98% 95% 99%  Weight: 68.1 kg (150 lb 3.2 oz)   64 kg (141 lb)  Height:        Wt Readings from Last 3 Encounters:  02/28/16 64 kg (141 lb)  02/25/16 73.4 kg (161 lb 12.8 oz)  01/12/16 73.8 kg (162 lb 9.6 oz)     Intake/Output Summary (Last 24 hours) at 02/28/16 1244 Last data filed at 02/28/16 1240  Gross per 24 hour  Intake             1080 ml  Output             3050 ml  Net            -1970 ml     Physical Exam  Awake Alert,Extremely hard of hearing  [place].AT,PERRAL Supple Neck,No JVD,  Symmetrical Chest wall movement, diminished air entry at the bases RRR,No Gallops,Rubs or new Murmurs, No Parasternal Heave +ve B.Sounds, Abd Soft, No tenderness,No rebound -  guarding or rigidity. No Cyanosis, Clubbing or edema, No new Rash or bruise     Data Review:    CBC  Recent Labs Lab 02/25/16 1701 02/26/16 0313 02/27/16 0343  WBC 5.8 3.2* 6.5  HGB 13.5 12.2* 12.5*  HCT 41.3 37.5* 38.8*  PLT 165 154 145*  MCV 85.9 84.8 86.8  MCH 28.1 27.6 28.0  MCHC 32.7 32.5 32.2  RDW 16.2* 16.0* 16.6*    Chemistries   Recent Labs Lab 02/25/16 1701 02/26/16 0313 02/27/16 0343 02/28/16 0425  NA 140 139 140 139  K 4.0 3.7 3.7 3.4*  CL 106 106 101 100*  CO2 25 25 28  33*  GLUCOSE 95 144* 105* 89  BUN 23* 22* 34* 31*  CREATININE 1.48* 1.39* 1.62* 1.69*  CALCIUM 9.7 9.1 9.3 9.0  MG  --  1.8  --   --   AST 41 30  --   --   ALT 31 29  --   --   ALKPHOS 83 75  --   --   BILITOT 0.8 1.3*  --   --    ------------------------------------------------------------------------------------------------------------------ No results for input(s): CHOL, HDL, LDLCALC, TRIG, CHOLHDL, LDLDIRECT in the last 72 hours.  Lab Results  Component Value Date   HGBA1C 5.6 02/26/2016   ------------------------------------------------------------------------------------------------------------------  Recent Labs  02/26/16 0313  TSH 1.173   ------------------------------------------------------------------------------------------------------------------ No results for input(s): VITAMINB12, FOLATE, FERRITIN, TIBC, IRON, RETICCTPCT in the last 72 hours.  Coagulation profile No results for input(s): INR, PROTIME in the last 168  hours.  No results for input(s): DDIMER in the last 72 hours.  Cardiac Enzymes  Recent Labs Lab 02/25/16 2256 02/26/16 0313 02/26/16 1015  TROPONINI 0.06* 0.06* 0.07*   ------------------------------------------------------------------------------------------------------------------    Component Value Date/Time   BNP 2,021.8 (H) 02/26/2016 0313    Inpatient Medications  Scheduled Meds: . aspirin EC  81 mg Oral Daily  . [START  ON 02/29/2016] enoxaparin (LOVENOX) injection  30 mg Subcutaneous Q24H  . escitalopram  10 mg Oral BH-q7a  . levothyroxine  75 mcg Oral QAC breakfast  . QUEtiapine  200 mg Oral QHS  . sodium chloride flush  3 mL Intravenous Q12H  . sodium chloride flush  3 mL Intravenous Q12H   Continuous Infusions: PRN Meds:.sodium chloride, acetaminophen, HYDROcodone-acetaminophen, ondansetron **OR** ondansetron (ZOFRAN) IV, polyethylene glycol, sodium chloride flush  Micro Results No results found for this or any previous visit (from the past 240 hour(s)).  Radiology Reports Dg Chest 2 View  Result Date: 02/27/2016 CLINICAL DATA:  Pleural effusion evaluation. EXAM: CHEST  2 VIEW COMPARISON:  02/25/2016 FINDINGS: Examination demonstrates evidence of moderate size bilateral pleural effusions right greater than left without significant change. Likely associated atelectasis in the lung bases. Cannot exclude infection in the lung bases. Mild prominence of the right hilar region. Stable cardiomegaly. Calcified plaque over the thoracic aorta. Degenerative change of the spine. Mild compression fracture in the region of the thoracolumbar junction unchanged. IMPRESSION: Evidence of moderate size bilateral pleural effusions right greater than left likely with associated basilar atelectasis unchanged. Cannot exclude infection in the lung bases. Mild prominence of the right hilum. Recommend follow-up to resolution. Cardiomegaly. Aortic atherosclerosis. Stable compression fracture in the region of the thoracolumbar junction. Electronically Signed   By: Marin Olp M.D.   On: 02/27/2016 10:07   Dg Chest 2 View  Result Date: 02/25/2016 CLINICAL DATA:  Right lower lobe decreased breath sounds. Shortness of breath for 2-3 months. EXAM: CHEST  2 VIEW COMPARISON:  10/14/07 FINDINGS: There is moderate cardiac enlargement. Bilateral pleural effusions are identified right greater than left. Overlying areas of atelectasis are  noted. IMPRESSION: 1. Bilateral pleural effusions right greater in left. 2. Bibasilar atelectasis. Electronically Signed   By: Kerby Moors M.D.   On: 02/25/2016 14:56     Haylei Cobin M.D on 02/28/2016 at 12:44 PM  Between 7am to 7pm - Pager - [phone]  After 7pm go to [email or web address] - password [facility]  [facility] -  Office  [phone]

## 2016-02-28 NOTE — Progress Notes (Signed)
Pt ambulated in a hallway without any difficulty in breathing. Pt tolerated well. Vitals stable

## 2016-02-28 NOTE — Progress Notes (Signed)
Pt had an uneventful night on room air, with stable vitals

## 2016-02-28 NOTE — Progress Notes (Addendum)
Patient Name: [name] [name] Date of Encounter: 02/28/2016  Primary Cardiologist:   Dr. Debbe Odea Problem List     Active Problems:   RBBB   HTN (hypertension)   Prediabetes   SDAT (senile dementia of Alzheimer's type)   History of prostate cancer   Bilateral pleural effusion   Acute respiratory failure (HCC)   CKD (chronic kidney disease) stage 3, GFR 30-59 ml/min   Acute on chronic diastolic congestive heart failure ([place])     Subjective   He denies SOB or pain.    Inpatient Medications    Scheduled Meds: . aspirin EC  81 mg Oral Daily  . enoxaparin (LOVENOX) injection  40 mg Subcutaneous Q24H  . escitalopram  10 mg Oral BH-q7a  . levothyroxine  75 mcg Oral QAC breakfast  . QUEtiapine  200 mg Oral QHS  . sodium chloride flush  3 mL Intravenous Q12H  . sodium chloride flush  3 mL Intravenous Q12H   Continuous Infusions:  PRN Meds: sodium chloride, acetaminophen, HYDROcodone-acetaminophen, ondansetron **OR** ondansetron (ZOFRAN) IV, polyethylene glycol, sodium chloride flush   Vital Signs    Vitals:   02/27/16 0615 02/27/16 1200 02/27/16 2030 02/28/16 0516  BP: 123/67 126/73 137/76 114/85  Pulse: 66 66 68 (!) 58  Resp: 20 20 20 16   Temp: 97.5 F (36.4 C) 97.4 F (36.3 C) 97.9 F (36.6 C) 97.5 F (36.4 C)  TempSrc: Oral Oral Oral Oral  SpO2: 99% 98% 95% 99%  Weight: 150 lb 3.2 oz (68.1 kg)   141 lb (64 kg)  Height:        Intake/Output Summary (Last 24 hours) at 02/28/16 0751 Last data filed at 02/28/16 0529  Gross per 24 hour  Intake              840 ml  Output             2150 ml  Net            -1310 ml   Filed Weights   02/26/16 0508 02/27/16 0615 02/28/16 0516  Weight: 149 lb 8 oz (67.8 kg) 150 lb 3.2 oz (68.1 kg) 141 lb (64 kg)    Physical Exam    GEN: NAD.  Neck:  No  JVD Cardiac: Regular Rate and Rhythm, 3/6 holosystolic apical murmur, rubs, or gallops.  No edema.  Radials/DP/PT 2+  and equal bilaterally.  Respiratory:   Respirations  regular and unlabored, clear to auscultation bilaterally. GI: Soft, nontender, nondistended, BS + x 4. Skin: warm and dry, no rash. Neuro:   Strength and sensation are intact. Psych:  AAOx3.  Normal affect.  He was confused answering questions  Labs    CBC  Recent Labs  02/26/16 0313 02/27/16 0343  WBC 3.2* 6.5  HGB 12.2* 12.5*  HCT 37.5* 38.8*  MCV 84.8 86.8  PLT 154 [id number]*   Basic Metabolic Panel  Recent Labs  02/26/16 0313 02/27/16 0343 02/28/16 0425  NA 139 140 139  K 3.7 3.7 3.4*  CL 106 101 100*  CO2 25 28 33*  GLUCOSE 144* 105* 89  BUN 22* 34* 31*  CREATININE 1.39* 1.62* 1.69*  CALCIUM 9.1 9.3 9.0  MG 1.8  --   --   PHOS 3.8  --   --    Liver Function Tests  Recent Labs  02/25/16 1701 02/26/16 0313  AST 41 30  ALT 31 29  ALKPHOS 83 75  BILITOT 0.8 1.3*  PROT 6.4*  5.6*  ALBUMIN 3.8 3.4*   No results for input(s): LIPASE, AMYLASE in the last 72 hours. Cardiac Enzymes  Recent Labs  02/25/16 2256 02/26/16 0313 02/26/16 1015  TROPONINI 0.06* 0.06* 0.07*   BNP Invalid input(s): POCBNP D-Dimer  Recent Labs  02/25/16 1213  DDIMER 1.43*   Hemoglobin A1C  Recent Labs  02/26/16 0313  HGBA1C 5.6   Fasting Lipid Panel No results for input(s): CHOL, HDL, LDLCALC, TRIG, CHOLHDL, LDLDIRECT in the last 72 hours. Thyroid Function Tests  Recent Labs  02/26/16 0313  TSH 1.173    Telemetry    NSR - Personally Reviewed  ECG    NA- Personally Reviewed  Radiology    Dg Chest 2 View  Result Date: 02/27/2016 CLINICAL DATA:  Pleural effusion evaluation. EXAM: CHEST  2 VIEW COMPARISON:  02/25/2016 FINDINGS: Examination demonstrates evidence of moderate size bilateral pleural effusions right greater than left without significant change. Likely associated atelectasis in the lung bases. Cannot exclude infection in the lung bases. Mild prominence of the right hilar region. Stable cardiomegaly. Calcified plaque over the thoracic  aorta. Degenerative change of the spine. Mild compression fracture in the region of the thoracolumbar junction unchanged. IMPRESSION: Evidence of moderate size bilateral pleural effusions right greater than left likely with associated basilar atelectasis unchanged. Cannot exclude infection in the lung bases. Mild prominence of the right hilum. Recommend follow-up to resolution. Cardiomegaly. Aortic atherosclerosis. Stable compression fracture in the region of the thoracolumbar junction. Electronically Signed   By: Marin Olp M.D.   On: 02/27/2016 10:07    Cardiac Studies   ECHO - Mild LVH with moderate basal septal hypertrophy and LVEF   approximately 40%. Severe inferior/inferoseptal hypokinesis   noted. Restrictive diastolic filling pattern. Mild to moderate   left atrial enlargement. Moderately calcified mitral annulus with   mild to moderate mitral regurgitation. Moderately sclerotic   aortic valve with trivial aortic regurgitation. Mildly ectatic   aortic root. Mildly dilated right ventricle with low normal   contraction. Mild tricuspid regurgitation with evidence of at   least moderately elevated pulmonary pressures, RV-RA gradient 50   mmHg. Moderate circumferential pericardial effusion with   intermittent diastolic compression of the right atrium although   no right ventricular compromise.  Patient Profile     82 year old male with new onset RBBB admitted with SOB.   He was found to have a BNP of 2000 and bilateral pleural effusions.    Assessment & Plan    HTN:  BP is currently well controlled without.  No additional meds are suggested.    PERICARDIAL EFFUSION:   Moderate on echo without tamponade.  Will follow clinically and repeat echo in the months to come.  No clinical signs of tamponade.  I reviewed the images.    CARDIOMYOPATHY:  BNP elevated and pleural effusions.  Acute systolic and diastolic HF.  Echo as above with low/moderate reduction in EF with regional wall  motion abnormalities.  Mild/Mod MR.  Given elevated creat I would not jump to cath at this point.  He appears to be somewhat frail.  I did call  his daughter and she agrees that at this point we would not cath.  We could risk stratify with [facility] as an out patient.  He has had no acute ischemic symptoms.   Troponins were non diagnostic.  Continue with ASA.  Low heart rate precludes adding a beta blocker.    VOLUME OVERLOAD:  He does not appear to be  volume overloaded by exam.  However, the CXR showed significant edema yesterday and he had residual at least moderate effusions.  (I reviewed the films) His creat is stable but elevated.  I will cautiously give him Lasix 40 mg IV today.  Needs to be dosed again tomorrow.  DISPOSITION:  He lives with family (although he said that he lives alone.)  His daughter is quite concerned that he is not ambulating.  I will consult PT.  He walks without a walker at home slowly.     Signed, Minus Breeding, MD  02/28/2016, 7:51 AM

## 2016-02-29 DIAGNOSIS — I5033 Acute on chronic diastolic (congestive) heart failure: Secondary | ICD-10-CM

## 2016-02-29 LAB — BASIC METABOLIC PANEL
ANION GAP: 9 (ref 5–15)
BUN: 31 mg/dL — ABNORMAL HIGH (ref 6–20)
CHLORIDE: 102 mmol/L (ref 101–111)
CO2: 26 mmol/L (ref 22–32)
Calcium: 9.3 mg/dL (ref 8.9–10.3)
Creatinine, Ser: 1.26 mg/dL — ABNORMAL HIGH (ref 0.61–1.24)
GFR calc non Af Amer: 50 mL/min — ABNORMAL LOW (ref >60)
GFR, EST AFRICAN AMERICAN: 58 mL/min — AB (ref >60)
Glucose, Bld: 93 mg/dL (ref 65–99)
Potassium: 3.4 mmol/L — ABNORMAL LOW (ref 3.5–5.1)
SODIUM: 137 mmol/L (ref 135–145)

## 2016-02-29 MED ORDER — FUROSEMIDE 10 MG/ML IJ SOLN
40.0000 mg | Freq: Once | INTRAMUSCULAR | Status: AC
  Administered 2016-02-29: 40 mg via INTRAVENOUS
  Filled 2016-02-29: qty 4

## 2016-02-29 MED ORDER — POTASSIUM CHLORIDE CRYS ER 20 MEQ PO TBCR
40.0000 meq | EXTENDED_RELEASE_TABLET | Freq: Once | ORAL | Status: AC
  Administered 2016-02-29: 40 meq via ORAL
  Filled 2016-02-29: qty 2

## 2016-02-29 MED ORDER — ENOXAPARIN SODIUM 40 MG/0.4ML ~~LOC~~ SOLN
40.0000 mg | SUBCUTANEOUS | Status: DC
  Administered 2016-03-01: 40 mg via SUBCUTANEOUS
  Filled 2016-02-29: qty 0.4

## 2016-02-29 NOTE — Evaluation (Signed)
Physical Therapy Evaluation Patient Details Name: [name] [name] MRN: [id number] DOB: [birth year] Today's Date: 02/29/2016   History of Present Illness  82 y.o. male with medical history significant of HTN, HLD, Prostate Cancer remote from 1999, dementia, CK D stage II, hypothyroidism and depression. Presents for SOB.  Clinical Impression  Patient demonstrates deficits in functional mobility as indicated below. Will need continued skilled PT to address deficits and maximzie function. Will see as indicated and progress as tolerated.  Recommend HHPT upon acute discharge.    Follow Up Recommendations Home health PT;Supervision for mobility/OOB    Equipment Recommendations  None recommended by PT    Recommendations for Other Services       Precautions / Restrictions Precautions Precautions: Fall Restrictions Weight Bearing Restrictions: No      Mobility  Bed Mobility               General bed mobility comments: received in chair  Transfers Overall transfer level: Needs assistance Equipment used: 1 person hand held assist Transfers: Sit to/from Stand Sit to Stand: Min guard         General transfer comment: Min guard to elevate to standing, no physical assist required  Ambulation/Gait Ambulation/Gait assistance: Min guard Ambulation Distance (Feet): 180 Feet Assistive device: 1 person hand held assist Gait Pattern/deviations: Step-to pattern;Decreased stride length;Trunk flexed;Drifts right/left;Narrow base of support Gait velocity: decreased Gait velocity interpretation: Below normal speed for age/gender General Gait Details: some instability noted with ambulation, HHA provided but no utilized, patient with stable saturations throughout activity  Stairs            Wheelchair Mobility    Modified Rankin (Stroke Patients Only)       Balance Overall balance assessment: Needs assistance Sitting-balance support: Feet supported Sitting balance-Leahy  Scale: Good     Standing balance support: No upper extremity supported Standing balance-Leahy Scale: Fair                               Pertinent Vitals/Pain Pain Assessment: Faces Faces Pain Scale: No hurt    Home Living Family/patient expects to be discharged to:: Private residence Living Arrangements: Children Available Help at Discharge: Family Type of Home: House Home Access: Stairs to enter Entrance Stairs-Rails: Can reach both Entrance Stairs-Number of Steps: 3 Home Layout: One level Home Equipment: Cane - single point      Prior Function Level of Independence: Needs assistance   Gait / Transfers Assistance Needed: supervision for activity at times, otherwise independent     Comments: has an aide 4 days a week for 3-4 hours     Hand Dominance   Dominant Hand: Right    Extremity/Trunk Assessment   Upper Extremity Assessment Upper Extremity Assessment: Generalized weakness    Lower Extremity Assessment Lower Extremity Assessment: Generalized weakness (+ arthritic changes)    Cervical / Trunk Assessment Cervical / Trunk Assessment: Kyphotic  Communication   Communication: HOH  Cognition Arousal/Alertness: Awake/alert Behavior During Therapy: WFL for tasks assessed/performed Overall Cognitive Status: History of cognitive impairments - at baseline                      General Comments      Exercises     Assessment/Plan    PT Assessment Patient needs continued PT services  PT Problem List Decreased strength;Decreased activity tolerance;Decreased mobility;Decreased safety awareness;Cardiopulmonary status limiting activity;Decreased balance  PT Treatment Interventions DME instruction;Gait training;Stair training;Functional mobility training;Therapeutic activities;Therapeutic exercise;Balance training;Patient/family education    PT Goals (Current goals can be found in the Care Plan section)  Acute Rehab PT  Goals Patient Stated Goal: to go home PT Goal Formulation: With patient/family Time For Goal Achievement: 03/14/16 Potential to Achieve Goals: Good    Frequency Min 3X/week   Barriers to discharge        Co-evaluation               End of Session Equipment Utilized During Treatment: Gait belt Activity Tolerance: Patient tolerated treatment well Patient left: in chair;with call bell/phone within reach;with family/visitor present Nurse Communication: Mobility status         Time: 1000-1018 PT Time Calculation (min) (ACUTE ONLY): 18 min   Charges:   PT Evaluation $PT Eval Moderate Complexity: 1 Procedure     PT G Codes:        Duncan Dull 2016-03-04, 10:22 AM Alben Deeds, PT DPT  [phone]

## 2016-02-29 NOTE — Care Management Note (Signed)
Case Management Note  Patient Details  Name: [name] [name] MRN: [id number] Date of Birth: [birth year]  Subjective/Objective:        Admitted with CHF            Action/Plan: Patient lives at home with his daughter Milagros Loll [phone]) PCP: Alesia Richards, MD; has private insurance with Medicare; he also receives assistance from [employer] 3 hrs a week; Patient could benefit from a Disease Management Program for CHF; [place] choice offered, daughter chose [place]; Santiago Glad with [facility] called for arrangements.  Expected Discharge Date:   possibly 03/01/2016               Expected Discharge Plan:  [place]  Discharge planning Services  CM Consult    Choice offered to:  Adult Children  HH Arranged:  RN, Disease Management, PT [place] Agency:  [place]  Status of Service:  In process, will continue to follow  Sherrilyn Rist [id number] 02/29/2016, 12:07 PM

## 2016-02-29 NOTE — Progress Notes (Addendum)
PROGRESS NOTE                                                                                                                                                                                                             Patient Demographics:    [name] [name], is a 82 y.o. male, DOB - 17-Jan-1928, [id number]  Admit date - 02/25/2016   Admitting Physician Toy Baker, MD  Outpatient Primary MD for the patient is Alesia Richards, MD  LOS - 4   Chief Complaint  Patient presents with  . Shortness of Breath       Brief Narrative   82 y.o. male with medical history significant of HTN, HLD, Prostate Cancer remote from 1999, dementia, CK D stage II, hypothyroidism and depression, Sent by his PCP for evaluation of bilateral free effusion, and right bundle branch block.    Subjective:    [name] [name] is extremely hard of hearing, he denies any complaints.   Assessment  & Plan :    Active Problems:   RBBB   HTN (hypertension)   Prediabetes   SDAT (senile dementia of Alzheimer's type)   History of prostate cancer   Bilateral pleural effusion   Acute respiratory failure (HCC)   CKD (chronic kidney disease) stage 3, GFR 30-59 ml/min   Acute on chronic diastolic congestive heart failure (HCC)  Acute on chronic Combined systolic and diastolic heart failure - Most recent 2-D echo 2014 with a preserved EF, grade 1 diastolic dysfunction, presents with progressive dyspnea, has significantly elevated BNP, as well as evidence of pleural effusion or volume overload on chest x-ray - Repeat 2-D echo showing EF 40%, severe hypokinesis in the inferior myocardium, increased left ventricular diastolic compliance., No plan for cardiac cath currently per cards - Diuresis per cardiology, dose based on clinical exam and renal function, continue to monitor renal function , -5.2 L since admission . - Chest x-ray still showing pleural effusion even after  diuresis.  Pericardial effusion - Cardiology input appreciated, moderate on echo without tamponade, accommodation for repeat echo as an outpatient  Hypertension - Daughter reports his antihypertensive medication(she think he was on Cardizem) has been stopped last August for soft blood pressure, reports blood pressure been acceptable at home.  Prediabetes -  A1c is 5.6, CBGs are controlled  CKD stage III - Creatinine increased  to 1.2 today, will dose Lasix as needed  Hypothyroidism - Continue Synthroid  Depression - Continue with home medication    Code Status : DO NOT RESUSCITATE, confirmed by patient  Family Communication  : Daughter at bedside  Disposition Plan  : Home with home PT in a.m.  Consults  :  Cardiology  Procedures  : None  DVT Prophylaxis  :  Lovenox -  Lab Results  Component Value Date   PLT 145 (L) 02/27/2016    Antibiotics  :    Anti-infectives    None        Objective:   Vitals:   02/28/16 0516 02/28/16 1725 02/28/16 2055 02/29/16 0554  BP: 114/85 130/77 130/79 117/76  Pulse: (!) 58 67 65 61  Resp: 16 18 20 20   Temp: 97.5 F (36.4 C) 98.1 F (36.7 C) 97.8 F (36.6 C) 97.8 F (36.6 C)  TempSrc: Oral Oral Oral Oral  SpO2: 99% 100% 95% 95%  Weight: 64 kg (141 lb)   65.4 kg (144 lb 3.2 oz)  Height:        Wt Readings from Last 3 Encounters:  02/29/16 65.4 kg (144 lb 3.2 oz)  02/25/16 73.4 kg (161 lb 12.8 oz)  01/12/16 73.8 kg (162 lb 9.6 oz)     Intake/Output Summary (Last 24 hours) at 02/29/16 1348 Last data filed at 02/29/16 1324  Gross per 24 hour  Intake             1443 ml  Output             1150 ml  Net              293 ml     Physical Exam  Awake Alert,Extremely hard of hearing  [place].AT,PERRAL Supple Neck,No JVD,  Symmetrical Chest wall movement, diminished air entry at the bases RRR,No Gallops,Rubs or new Murmurs, No Parasternal Heave +ve B.Sounds, Abd Soft, No tenderness,No rebound - guarding or rigidity. No  Cyanosis, Clubbing or edema, No new Rash or bruise     Data Review:    CBC  Recent Labs Lab 02/25/16 1701 02/26/16 0313 02/27/16 0343  WBC 5.8 3.2* 6.5  HGB 13.5 12.2* 12.5*  HCT 41.3 37.5* 38.8*  PLT 165 154 145*  MCV 85.9 84.8 86.8  MCH 28.1 27.6 28.0  MCHC 32.7 32.5 32.2  RDW 16.2* 16.0* 16.6*    Chemistries   Recent Labs Lab 02/25/16 1701 02/26/16 0313 02/27/16 0343 02/28/16 0425 02/29/16 0310  NA 140 139 140 139 137  K 4.0 3.7 3.7 3.4* 3.4*  CL 106 106 101 100* 102  CO2 25 25 28  33* 26  GLUCOSE 95 144* 105* 89 93  BUN 23* 22* 34* 31* 31*  CREATININE 1.48* 1.39* 1.62* 1.69* 1.26*  CALCIUM 9.7 9.1 9.3 9.0 9.3  MG  --  1.8  --   --   --   AST 41 30  --   --   --   ALT 31 29  --   --   --   ALKPHOS 83 75  --   --   --   BILITOT 0.8 1.3*  --   --   --    ------------------------------------------------------------------------------------------------------------------ No results for input(s): CHOL, HDL, LDLCALC, TRIG, CHOLHDL, LDLDIRECT in the last 72 hours.  Lab Results  Component Value Date   HGBA1C 5.6 02/26/2016   ------------------------------------------------------------------------------------------------------------------ No results for input(s): TSH, T4TOTAL, T3FREE, THYROIDAB in the last 72 hours.  Invalid  input(s): FREET3 ------------------------------------------------------------------------------------------------------------------ No results for input(s): VITAMINB12, FOLATE, FERRITIN, TIBC, IRON, RETICCTPCT in the last 72 hours.  Coagulation profile No results for input(s): INR, PROTIME in the last 168 hours.  No results for input(s): DDIMER in the last 72 hours.  Cardiac Enzymes  Recent Labs Lab 02/25/16 2256 02/26/16 0313 02/26/16 1015  TROPONINI 0.06* 0.06* 0.07*   ------------------------------------------------------------------------------------------------------------------    Component Value Date/Time   BNP 2,021.8  (H) 02/26/2016 0313    Inpatient Medications  Scheduled Meds: . aspirin EC  81 mg Oral Daily  . [START ON 03/01/2016] enoxaparin (LOVENOX) injection  40 mg Subcutaneous Q24H  . escitalopram  10 mg Oral BH-q7a  . levothyroxine  75 mcg Oral QAC breakfast  . potassium chloride  40 mEq Oral Once  . QUEtiapine  200 mg Oral QHS  . sodium chloride flush  3 mL Intravenous Q12H  . sodium chloride flush  3 mL Intravenous Q12H   Continuous Infusions: PRN Meds:.sodium chloride, acetaminophen, HYDROcodone-acetaminophen, ondansetron **OR** ondansetron (ZOFRAN) IV, polyethylene glycol, sodium chloride flush  Micro Results No results found for this or any previous visit (from the past 240 hour(s)).  Radiology Reports Dg Chest 2 View  Result Date: 02/27/2016 CLINICAL DATA:  Pleural effusion evaluation. EXAM: CHEST  2 VIEW COMPARISON:  02/25/2016 FINDINGS: Examination demonstrates evidence of moderate size bilateral pleural effusions right greater than left without significant change. Likely associated atelectasis in the lung bases. Cannot exclude infection in the lung bases. Mild prominence of the right hilar region. Stable cardiomegaly. Calcified plaque over the thoracic aorta. Degenerative change of the spine. Mild compression fracture in the region of the thoracolumbar junction unchanged. IMPRESSION: Evidence of moderate size bilateral pleural effusions right greater than left likely with associated basilar atelectasis unchanged. Cannot exclude infection in the lung bases. Mild prominence of the right hilum. Recommend follow-up to resolution. Cardiomegaly. Aortic atherosclerosis. Stable compression fracture in the region of the thoracolumbar junction. Electronically Signed   By: Marin Olp M.D.   On: 02/27/2016 10:07   Dg Chest 2 View  Result Date: 02/25/2016 CLINICAL DATA:  Right lower lobe decreased breath sounds. Shortness of breath for 2-3 months. EXAM: CHEST  2 VIEW COMPARISON:  10/14/07  FINDINGS: There is moderate cardiac enlargement. Bilateral pleural effusions are identified right greater than left. Overlying areas of atelectasis are noted. IMPRESSION: 1. Bilateral pleural effusions right greater in left. 2. Bibasilar atelectasis. Electronically Signed   By: Kerby Moors M.D.   On: 02/25/2016 14:56     Talin Feister M.D on 02/29/2016 at 1:48 PM  Between 7am to 7pm - Pager - [phone]  After 7pm go to [email or web address] - password [email or web address]  [facility] -  Office  [phone]

## 2016-02-29 NOTE — Progress Notes (Signed)
Patient Name: [name] [name] Date of Encounter: 02/29/2016  Primary Cardiologist:   Dr. Debbe Odea Problem List     Active Problems:   RBBB   HTN (hypertension)   Prediabetes   SDAT (senile dementia of Alzheimer's type)   History of prostate cancer   Bilateral pleural effusion   Acute respiratory failure (HCC)   CKD (chronic kidney disease) stage 3, GFR 30-59 ml/min   Acute on chronic diastolic congestive heart failure (HCC)     Subjective   Hard of hearing no complaints   Inpatient Medications    Scheduled Meds: . aspirin EC  81 mg Oral Daily  . enoxaparin (LOVENOX) injection  30 mg Subcutaneous Q24H  . escitalopram  10 mg Oral BH-q7a  . levothyroxine  75 mcg Oral QAC breakfast  . QUEtiapine  200 mg Oral QHS  . sodium chloride flush  3 mL Intravenous Q12H  . sodium chloride flush  3 mL Intravenous Q12H   Continuous Infusions:  PRN Meds: sodium chloride, acetaminophen, HYDROcodone-acetaminophen, ondansetron **OR** ondansetron (ZOFRAN) IV, polyethylene glycol, sodium chloride flush   Vital Signs    Vitals:   02/28/16 0516 02/28/16 1725 02/28/16 2055 02/29/16 0554  BP: 114/85 130/77 130/79 117/76  Pulse: (!) 58 67 65 61  Resp: 16 18 20 20   Temp: 97.5 F (36.4 C) 98.1 F (36.7 C) 97.8 F (36.6 C) 97.8 F (36.6 C)  TempSrc: Oral Oral Oral Oral  SpO2: 99% 100% 95% 95%  Weight: 141 lb (64 kg)   144 lb 3.2 oz (65.4 kg)  Height:        Intake/Output Summary (Last 24 hours) at 02/29/16 0853 Last data filed at 02/29/16 0616  Gross per 24 hour  Intake             1080 ml  Output             2650 ml  Net            -1570 ml   Filed Weights   02/27/16 0615 02/28/16 0516 02/29/16 0554  Weight: 150 lb 3.2 oz (68.1 kg) 141 lb (64 kg) 144 lb 3.2 oz (65.4 kg)    Physical Exam    GEN: NAD.  Neck:  No  JVD Cardiac: Regular Rate and Rhythm, 3/6 holosystolic apical murmur, rubs, or gallops.  No edema.  Radials/DP/PT 2+  and equal bilaterally.    Respiratory:  Respirations  regular and unlabored, clear to auscultation bilaterally. GI: Soft, nontender, nondistended, BS + x 4. Skin: warm and dry, no rash. Neuro:   Strength and sensation are intact. Psych:  AAOx3.  Normal affect.  He was confused answering questions  Labs    CBC  Recent Labs  02/27/16 0343  WBC 6.5  HGB 12.5*  HCT 38.8*  MCV 86.8  PLT [id number]*   Basic Metabolic Panel  Recent Labs  02/28/16 0425 02/29/16 0310  NA 139 137  K 3.4* 3.4*  CL 100* 102  CO2 33* 26  GLUCOSE 89 93  BUN 31* 31*  CREATININE 1.69* 1.26*  CALCIUM 9.0 9.3   Liver Function Tests No results for input(s): AST, ALT, ALKPHOS, BILITOT, PROT, ALBUMIN in the last 72 hours. No results for input(s): LIPASE, AMYLASE in the last 72 hours. Cardiac Enzymes  Recent Labs  02/26/16 1015  TROPONINI 0.07*     Telemetry    NSR - Personally Reviewed  ECG    NA- Personally Reviewed  Radiology    No  results found.  Cardiac Studies   ECHO - Mild LVH with moderate basal septal hypertrophy and LVEF   approximately 40%. Severe inferior/inferoseptal hypokinesis   noted. Restrictive diastolic filling pattern. Mild to moderate   left atrial enlargement. Moderately calcified mitral annulus with   mild to moderate mitral regurgitation. Moderately sclerotic   aortic valve with trivial aortic regurgitation. Mildly ectatic   aortic root. Mildly dilated right ventricle with low normal   contraction. Mild tricuspid regurgitation with evidence of at   least moderately elevated pulmonary pressures, RV-RA gradient 50   mmHg. Moderate circumferential pericardial effusion with   intermittent diastolic compression of the right atrium although   no right ventricular compromise.  Patient Profile     82 year old male with new onset RBBB admitted with SOB.   He was found to have a BNP of 2000 and bilateral pleural effusions.    Assessment & Plan    HTN:  Well controlled   PERICARDIAL  EFFUSION:   Moderate no tamponade f/u echo in a month   CARDIOMYOPATHY:  BNP elevated and pleural effusions.  Acute systolic and diastolic HF.  Echo as above with low/moderate reduction in EF with regional wall motion abnormalities.  Mild/Mod MR.  Will give iv lasix today no indication for cath given Age and lack of chest pain. [facility] discussed this with daughter yesterday   VOLUME OVERLOAD:  Lasix iv today looks ok but with low EF and effusions   DISPOSITION:  Needs PT/OT to ambulate daughter will bring him home    Signed, Jenkins Rouge, MD  02/29/2016, 8:53 AM  Patient ID: [name] [name], male   DOB: [birth year], 82 y.o.   MRN: [id number]

## 2016-02-29 NOTE — Care Management Important Message (Signed)
Important Message  Patient Details  Name: [name] [name] MRN: [id number] Date of Birth: March 03, 1928   Medicare Important Message Given:  Yes    Trenna Kiely Montine Circle 02/29/2016, 12:47 PM

## 2016-03-01 ENCOUNTER — Telehealth: Payer: Self-pay | Admitting: *Deleted

## 2016-03-01 DIAGNOSIS — I5043 Acute on chronic combined systolic (congestive) and diastolic (congestive) heart failure: Secondary | ICD-10-CM

## 2016-03-01 LAB — BASIC METABOLIC PANEL
Anion gap: 6 (ref 5–15)
BUN: 27 mg/dL — AB (ref 6–20)
CALCIUM: 9.4 mg/dL (ref 8.9–10.3)
CHLORIDE: 101 mmol/L (ref 101–111)
CO2: 31 mmol/L (ref 22–32)
CREATININE: 1.38 mg/dL — AB (ref 0.61–1.24)
GFR calc Af Amer: 52 mL/min — ABNORMAL LOW (ref >60)
GFR calc non Af Amer: 45 mL/min — ABNORMAL LOW (ref >60)
GLUCOSE: 91 mg/dL (ref 65–99)
Potassium: 3.7 mmol/L (ref 3.5–5.1)
Sodium: 138 mmol/L (ref 135–145)

## 2016-03-01 MED ORDER — FUROSEMIDE 40 MG PO TABS: 40.0000 mg | ORAL_TABLET | Freq: Every day | ORAL | 0 refills | Status: DC

## 2016-03-01 MED ORDER — FUROSEMIDE 10 MG/ML IJ SOLN
40.0000 mg | Freq: Every day | INTRAMUSCULAR | Status: DC
  Administered 2016-03-01: 40 mg via INTRAVENOUS
  Filled 2016-03-01: qty 4

## 2016-03-01 MED ORDER — POTASSIUM CHLORIDE ER 10 MEQ PO TBCR
10.0000 meq | EXTENDED_RELEASE_TABLET | Freq: Every day | ORAL | 0 refills | Status: DC
Start: 2016-03-01 — End: 2016-03-08

## 2016-03-01 NOTE — Progress Notes (Signed)
Patient Name: [name] [name] Date of Encounter: 03/01/2016  Primary Cardiologist:   Dr. Debbe Odea Problem List     Active Problems:   RBBB   HTN (hypertension)   Prediabetes   SDAT (senile dementia of Alzheimer's type)   History of prostate cancer   Bilateral pleural effusion   Acute respiratory failure (HCC)   CKD (chronic kidney disease) stage 3, GFR 30-59 ml/min   Acute on chronic combined systolic and diastolic congestive heart failure (HCC)     Subjective   Hard of hearing no complaints   Inpatient Medications    Scheduled Meds: . aspirin EC  81 mg Oral Daily  . enoxaparin (LOVENOX) injection  40 mg Subcutaneous Q24H  . escitalopram  10 mg Oral BH-q7a  . levothyroxine  75 mcg Oral QAC breakfast  . QUEtiapine  200 mg Oral QHS  . sodium chloride flush  3 mL Intravenous Q12H  . sodium chloride flush  3 mL Intravenous Q12H   Continuous Infusions:  PRN Meds: sodium chloride, acetaminophen, HYDROcodone-acetaminophen, ondansetron **OR** ondansetron (ZOFRAN) IV, polyethylene glycol, sodium chloride flush   Vital Signs    Vitals:   02/29/16 0554 02/29/16 1523 02/29/16 2023 03/01/16 0604  BP: 117/76 122/85 (!) 113/57 119/66  Pulse: 61 63 64 63  Resp: 20 18 18 16   Temp: 97.8 F (36.6 C) 97.6 F (36.4 C) 98.3 F (36.8 C) 97.8 F (36.6 C)  TempSrc: Oral Oral Oral Oral  SpO2: 95% 99% 98% 98%  Weight: 144 lb 3.2 oz (65.4 kg)   143 lb 12.8 oz (65.2 kg)  Height:        Intake/Output Summary (Last 24 hours) at 03/01/16 0823 Last data filed at 03/01/16 0809  Gross per 24 hour  Intake              963 ml  Output              500 ml  Net              463 ml   Filed Weights   02/28/16 0516 02/29/16 0554 03/01/16 0604  Weight: 141 lb (64 kg) 144 lb 3.2 oz (65.4 kg) 143 lb 12.8 oz (65.2 kg)    Physical Exam    GEN: NAD.  Neck:  No  JVD Cardiac: Regular Rate and Rhythm, 3/6 holosystolic apical murmur, rubs, or gallops.  No edema.  Radials/DP/PT 2+   and equal bilaterally.  Respiratory:  Respirations  regular and unlabored, clear to auscultation bilaterally. GI: Soft, nontender, nondistended, BS + x 4. Skin: warm and dry, no rash. Neuro:   Strength and sensation are intact. Psych:  AAOx3.  Normal affect.  He was confused answering questions  Labs    CBC No results for input(s): WBC, NEUTROABS, HGB, HCT, MCV, PLT in the last 72 hours. Basic Metabolic Panel  Recent Labs  02/29/16 0310 03/01/16 0726  NA 137 138  K 3.4* 3.7  CL 102 101  CO2 26 31  GLUCOSE 93 91  BUN 31* 27*  CREATININE 1.26* 1.38*  CALCIUM 9.3 9.4   Liver Function Tests No results for input(s): AST, ALT, ALKPHOS, BILITOT, PROT, ALBUMIN in the last 72 hours. No results for input(s): LIPASE, AMYLASE in the last 72 hours. Cardiac Enzymes No results for input(s): CKTOTAL, CKMB, CKMBINDEX, TROPONINI in the last 72 hours.   Telemetry    NSR - Personally Reviewed  ECG    NA- Personally Reviewed  Radiology  No results found.  Cardiac Studies   ECHO - Mild LVH with moderate basal septal hypertrophy and LVEF   approximately 40%. Severe inferior/inferoseptal hypokinesis   noted. Restrictive diastolic filling pattern. Mild to moderate   left atrial enlargement. Moderately calcified mitral annulus with   mild to moderate mitral regurgitation. Moderately sclerotic   aortic valve with trivial aortic regurgitation. Mildly ectatic   aortic root. Mildly dilated right ventricle with low normal   contraction. Mild tricuspid regurgitation with evidence of at   least moderately elevated pulmonary pressures, RV-RA gradient 50   mmHg. Moderate circumferential pericardial effusion with   intermittent diastolic compression of the right atrium although   no right ventricular compromise.  Patient Profile     82 year old male with new onset RBBB admitted with SOB.   He was found to have a BNP of 2000 and bilateral pleural effusions.    Assessment & Plan      HTN:  Well controlled   PERICARDIAL EFFUSION:   Moderate no tamponade f/u echo in a month   CARDIOMYOPATHY:  BNP elevated and pleural effusions.  Acute systolic and diastolic HF.  Echo as above with low/moderate reduction in EF with regional wall motion abnormalities.  Mild/Mod MR.  Will give iv lasix today no indication for cath given Age and lack of chest pain. [facility] discussed this with daughter    VOLUME OVERLOAD:  Lasix iv today looks ok but with low EF and effusions net positive yesterday   DISPOSITION:  Needs PT/OT to ambulate daughter will bring him home    Signed, [name] Rouge, MD  03/01/2016, 8:23 AM  Patient ID: [name] [name], male   DOB: 08-Feb-1928, 82 y.o.   MRN: [id number]

## 2016-03-01 NOTE — Progress Notes (Signed)
Physical Therapy Treatment Patient Details Name: [name] [name] MRN: [id number] DOB: [birth year] Today's Date: 03/01/2016    History of Present Illness 82 y.o. male with medical history significant of HTN, HLD, Prostate Cancer remote from 1999, dementia, CK D stage II, hypothyroidism and depression. Presents for SOB.    PT Comments    Patient seen for mobility progression, tolerated increased distance today without difficulty. Ambulated on room air with saturations > 95% throughout all activity. Some instability noted, but no overt LOB or need for physical assist. Educated patient on safety and mobility expectations. Will continue to see and progress as tolerated.  Follow Up Recommendations  Home health PT;Supervision for mobility/OOB     Equipment Recommendations  None recommended by PT    Recommendations for Other Services       Precautions / Restrictions Precautions Precautions: Fall Restrictions Weight Bearing Restrictions: No    Mobility  Bed Mobility Overal bed mobility: Modified Independent             General bed mobility comments: increased time to perform, increased effort to scoot to EOB, no physical assist required  Transfers Overall transfer level: Needs assistance Equipment used: 1 person hand held assist Transfers: Sit to/from Stand Sit to Stand: Min guard         General transfer comment: Min guard to elevate to standing, no physical assist required  Ambulation/Gait Ambulation/Gait assistance: Min guard Ambulation Distance (Feet): 240 Feet Assistive device: 1 person hand held assist Gait Pattern/deviations: Step-to pattern;Decreased stride length;Trunk flexed;Drifts right/left;Narrow base of support Gait velocity: decreased Gait velocity interpretation: Below normal speed for age/gender General Gait Details: continued to provide HHA for comfort however patient did not utilize for assist. Improved gait speed today. Saturations on room air >95%  throughout all activity.   Stairs            Wheelchair Mobility    Modified Rankin (Stroke Patients Only)       Balance Overall balance assessment: Needs assistance Sitting-balance support: Feet supported Sitting balance-Leahy Scale: Good     Standing balance support: No upper extremity supported Standing balance-Leahy Scale: Fair                      Cognition Arousal/Alertness: Awake/alert Behavior During Therapy: WFL for tasks assessed/performed Overall Cognitive Status: History of cognitive impairments - at baseline                      Exercises      General Comments        Pertinent Vitals/Pain Pain Assessment: No/denies pain Faces Pain Scale: No hurt    Home Living                      Prior Function            PT Goals (current goals can now be found in the care plan section) Acute Rehab PT Goals Patient Stated Goal: to go home PT Goal Formulation: With patient/family Time For Goal Achievement: 03/14/16 Potential to Achieve Goals: Good Progress towards PT goals: Progressing toward goals    Frequency    Min 3X/week      PT Plan Current plan remains appropriate    Co-evaluation             End of Session Equipment Utilized During Treatment: Gait belt Activity Tolerance: Patient tolerated treatment well Patient left: in chair;with call bell/phone within reach;with chair alarm set  Time: [id number] PT Time Calculation (min) (ACUTE ONLY): 17 min  Charges:  $Gait Training: 8-22 mins                    G Codes:      Duncan Dull 03-11-2016, 9:33 AM Alben Deeds, PT DPT  [phone]

## 2016-03-01 NOTE — Discharge Summary (Signed)
[name] [name], is a 82 y.o. male  DOB [birth year]  MRN [id number].  Admission date:  02/25/2016  Admitting Physician  Toy Baker, MD  Discharge Date:  03/01/2016   Primary MD  Alesia Richards, MD  Recommendations for primary care physician for things to follow:  - please check CBC. BMP during next visit as patient started on diuresis. - Please check 2 view chest x-ray to follow on pleural effusion after diuresis. - Patient to follow with cardiology in 4 weeks regarding further management of onset CHF, and pericardial effusion, will need to repeat echo in 4 weeks.   Admission Diagnosis  DOE (dyspnea on exertion) [R06.09] Pleural effusion [J90] Bilateral pleural effusion [J90]   Discharge Diagnosis  DOE (dyspnea on exertion) [R06.09] Pleural effusion [J90] Bilateral pleural effusion [J90]    Active Problems:   RBBB   HTN (hypertension)   Prediabetes   SDAT (senile dementia of Alzheimer's type)   History of prostate cancer   Bilateral pleural effusion   Acute respiratory failure (HCC)   CKD (chronic kidney disease) stage 3, GFR 30-59 ml/min   Acute on chronic combined systolic and diastolic congestive heart failure (HCC)      Past Medical History:  Diagnosis Date  . Depression   . HTN (hypertension)   . Hypothyroid   . Other testicular hypofunction   . Prediabetes   . Prostate cancer ([place])   . Stroke ([place])   . Vitamin D deficiency     Past Surgical History:  Procedure Laterality Date  . BIOPSY PROSTATE  2/99   pos for adenocarcinoma of prostate  . CYSTOURETHROSCOPY  7/99   and TUR of bladder cancer  . PROSTATE SURGERY  2007       History of present illness and  Hospital Course:     Kindly see H&P for history of present illness and admission details, please review complete Labs, Consult reports and Test reports for all details in brief  HPI  from the history  and physical done on the day of admission 01/26/2016  SUMNER MAILLE is a 82 y.o. male with medical history significant of HTN, HLD, Prostate Cancer remote from 1999, dementia, CK D stage II, hypothyroidism and depression    Presented with worsening over past few months progressive shortness of breath he presented to his primary care provider today was found to have EKG with new onset right bundle branch block he noted to have increase gain and minor Mollo extremity edema and orthopnea though chest x-ray showing bilateral pleural effusions for presumed bronchitis she was started on prednisone and azithromycin. Oxygen saturation 98% on room air in the office Family was told to take patient to emerge department. Patient denies any fevers or chills no chest pain his been having progressive dyspnea on exertion as he walks around his room. Haven't had any syncope or near-syncope. He has mild nonproductive cough no fevers or chills.   Family reports he has gained 3 pounds over fast 24 hours only.  Regarding pertinent Chronic  problems: Hyperlipidemia controlled with diet medication followed by primary care provider he's been diagnosed with prediabetes 2011 hemoglobin A1c was 5.9 2015   IN ER:  Temp (24hrs), Avg:97.7 F (36.5 C), Min:97.6 F (36.4 C), Max:97.7 F (36.5 C)      RR 35 satting 96% room air HR 76 BP 151/99 Sodium 140 potassium 4.0 creatinine 1.48 which is close to baseline WBC 5.8 hemoglobin 13.5  BNP 1153 Following Medications were ordered in ER: Medications  furosemide (LASIX) injection 20 mg (20 mg Intravenous Given 02/25/16 1930)     Hospital Course  82 y.o.malewith medical history significant of HTN, HLD, Prostate Cancer remote from 1999, dementia, CK D stage II, hypothyroidism and depression, Sent by his PCP for evaluation of bilateral free effusion, and right bundle branch block.   Acute on chronic Combined systolic and diastolic heart failure - Most recent 2-D  echo 2014 with a preserved EF, grade 1 diastolic dysfunction, presents with progressive dyspnea, has significantly elevated BNP, as well as evidence of pleural effusion or volume overload on chest x-ray - Repeat 2-D echo during hospital stay showing EF 40%, severe hypokinesis in the inferior myocardium, increased left ventricular diastolic compliance., No plan for cardiac cath currently given his age, renal function, and lack of chest pain per cards. - She was on IV diuresis during, -4.7 L during hospital stay , he will be discharged on 40 mg oral Lasix with instructions for fluid restriction and low-salt diet. - Chest x-ray showing stable pleural effusion even after diuresis, patient will need further  diuresis in outpatient setting, repeat 2 view x-ray in one week.  Pericardial effusion - Cardiology input appreciated, moderate on echo without tamponade, recommendation for repeat echo as an outpatient, patient to follow cardiology in 4 weeks  Hypertension - Daughter reports his antihypertensive medication(she think he was on Cardizem) has been stopped last August for soft blood pressure, reports blood pressure been acceptable at home.  Prediabetes -  A1c is 5.6, CBGs are controlled  CKD stage III - Creatinine peaked at 1.69 with IV diuresis, currently stable, creatinine 1.38 on discharge, monitor closely as on diuresis  Hypothyroidism - Continue Synthroid  Depression - Continue with home medication     Discharge Condition:  Stable   Follow UP  Follow-up Information    [facility].   Why:  They will do your home health care at your home Contact information: [place] [address] [phone]        Alesia Richards, MD On 03/08/2016.   Specialty:  Internal Medicine Why:  At 2:45 PM to check labs and repeat Chest xray. Contact information: [address] [place] [address] [phone]         Quay Burow, MD Follow up in 4 week(s).   Specialties:  Cardiology, Radiology Why:  Repeat echo regarding pericardial effusion, and  further assessment of new onset systolic CHF Contact information: [address] [place] [address] [phone]             Discharge Instructions  and  Discharge Medications     Discharge Instructions    Discharge instructions    Complete by:  As directed    Follow with Primary MD MCKEOWN,WILLIAM DAVID, MD in 7 days   Get CBC, CMP, 2 view Chest X ray checked  by Primary MD next visit.    Activity: As tolerated with Full fall precautions use walker/cane & assistance as needed  Disposition Home    Diet: Heart Healthy , law salt with fluid restriction 1.5 L/day , with feeding assistance and aspiration precautions.  For Heart failure patients - Check your Weight same time everyday, if you gain over 2 pounds, or you develop in leg swelling, experience more shortness of breath or chest pain, call your Primary MD immediately. Follow Cardiac Low Salt Diet and 1.5 lit/day fluid restriction.   On your next visit with your primary care physician please Get Medicines reviewed and adjusted.   Please request your Prim.MD to go over all Hospital Tests and Procedure/Radiological results at the follow up, please get all Hospital records sent to your Prim MD by signing hospital release before you go home.   If you experience worsening of your admission symptoms, develop shortness of breath, life threatening emergency, suicidal or homicidal thoughts you must seek medical attention immediately by calling 911 or calling your MD immediately  if symptoms less severe.  You Must read complete instructions/literature along with all the possible adverse reactions/side effects for all the Medicines you take and that have been prescribed to you. Take any new Medicines after you have completely understood and accpet all the possible adverse  reactions/side effects.   Do not drive, operating heavy machinery, perform activities at heights, swimming or participation in water activities or provide baby sitting services if your were admitted for syncope or siezures until you have seen by Primary MD or a Neurologist and advised to do so again.  Do not drive when taking Pain medications.    Do not take more than prescribed Pain, Sleep and Anxiety Medications  Special Instructions: If you have smoked or chewed Tobacco  in the last 2 yrs please stop smoking, stop any regular Alcohol  and or any Recreational drug use.  Wear Seat belts while driving.   Please note  You were cared for by a hospitalist during your hospital stay. If you have any questions about your discharge medications or the care you received while you were in the hospital after you are discharged, you can call the unit and asked to speak with the hospitalist on call if the hospitalist that took care of you is not available. Once you are discharged, your primary care physician will handle any further medical issues. Please note that NO REFILLS for any discharge medications will be authorized once you are discharged, as it is imperative that you return to your primary care physician (or establish a relationship with a primary care physician if you do not have one) for your aftercare needs so that they can reassess your need for medications and monitor your lab values.   Increase activity slowly    Complete by:  As directed      Allergies as of 03/01/2016      Reactions   Penicillins Hives, Itching, Swelling, Rash   Blisters also      Medication List    STOP taking these medications   azithromycin 250 MG tablet Commonly known as:  ZITHROMAX   predniSONE 20 MG tablet Commonly known as:  DELTASONE     TAKE these medications   aspirin EC 81 MG tablet Take 81 mg by mouth daily.   cholecalciferol 1000 units tablet Commonly known as:  VITAMIN D Take 1,000 Units  by mouth 2 (two) times daily.   escitalopram 10 MG tablet Commonly known as:  LEXAPRO Take 10 mg by mouth every morning.   FISH OIL PO Take 1 capsule by mouth daily.  furosemide 40 MG tablet Commonly known as:  LASIX Take 1 tablet (40 mg total) by mouth daily.   levothyroxine 75 MCG tablet Commonly known as:  SYNTHROID, LEVOTHROID Take 1 tablet (75 mcg total) by mouth daily.   potassium chloride 10 MEQ tablet Commonly known as:  K-DUR Take 1 tablet (10 mEq total) by mouth daily.   QUEtiapine 200 MG tablet Commonly known as:  SEROQUEL Take 200 mg by mouth at bedtime.         Diet and Activity recommendation: See Discharge Instructions above   Consults obtained -  cardiology   Major procedures and Radiology Reports - PLEASE review detailed and final reports for all details, in brief -   2 d echo 02/27/2016 Study Conclusions  - Left ventricle: The cavity size was normal. Wall thickness was   increased in a pattern of mild LVH. There was moderate focal   basal hypertrophy of the septum. Systolic function was normal.   The estimated ejection fraction was 40%. There is severe   hypokinesis of the inferior myocardium. Doppler parameters are   consistent with restrictive physiology, indicative of decreased   left ventricular diastolic compliance and/or increased left   atrial pressure. - Aortic valve: Mildly to moderately calcified annulus. Trileaflet;   normal thickness, mildly calcified leaflets. There was trivial   regurgitation. - Aortic root: The aortic root was mildly ectatic. - Mitral valve: Calcified annulus. There was mild to moderate   regurgitation. - Left atrium: The atrium was mildly to moderately dilated. - Right ventricle: The cavity size was mildly dilated. Systolic   function was low normal. - Tricuspid valve: There was mild regurgitation. Peak RV-RA   gradient (S): 50 mm Hg. - Pericardium, extracardiac: Moderate circumferential pericardial    effusion with intermittent diastolic compression of the right   atrium although no right ventricular compromise.  Impressions:  - Mild LVH with moderate basal septal hypertrophy and LVEF   approximately 40%. Severe inferior/inferoseptal hypokinesis   noted. Restrictive diastolic filling pattern. Mild to moderate   left atrial enlargement. Moderately calcified mitral annulus with   mild to moderate mitral regurgitation. Moderately sclerotic   aortic valve with trivial aortic regurgitation. Mildly ectatic   aortic root. Mildly dilated right ventricle with low normal   contraction. Mild tricuspid regurgitation with evidence of at   least moderately elevated pulmonary pressures, RV-RA gradient 50   mmHg. Moderate circumferential pericardial effusion with   intermittent diastolic compression of the right atrium although   no right ventricular compromise.   Dg Chest 2 View  Result Date: 02/27/2016 CLINICAL DATA:  Pleural effusion evaluation. EXAM: CHEST  2 VIEW COMPARISON:  02/25/2016 FINDINGS: Examination demonstrates evidence of moderate size bilateral pleural effusions right greater than left without significant change. Likely associated atelectasis in the lung bases. Cannot exclude infection in the lung bases. Mild prominence of the right hilar region. Stable cardiomegaly. Calcified plaque over the thoracic aorta. Degenerative change of the spine. Mild compression fracture in the region of the thoracolumbar junction unchanged. IMPRESSION: Evidence of moderate size bilateral pleural effusions right greater than left likely with associated basilar atelectasis unchanged. Cannot exclude infection in the lung bases. Mild prominence of the right hilum. Recommend follow-up to resolution. Cardiomegaly. Aortic atherosclerosis. Stable compression fracture in the region of the thoracolumbar junction. Electronically Signed   By: Marin Olp M.D.   On: 02/27/2016 10:07   Dg Chest 2 View  Result Date:  02/25/2016 CLINICAL DATA:  Right lower lobe decreased breath  sounds. Shortness of breath for 2-3 months. EXAM: CHEST  2 VIEW COMPARISON:  10/14/07 FINDINGS: There is moderate cardiac enlargement. Bilateral pleural effusions are identified right greater than left. Overlying areas of atelectasis are noted. IMPRESSION: 1. Bilateral pleural effusions right greater in left. 2. Bibasilar atelectasis. Electronically Signed   By: Kerby Moors M.D.   On: 02/25/2016 14:56    Micro Results     No results found for this or any previous visit (from the past 240 hour(s)).     Today   Subjective:   Raef Merkel today has no headache,no chest or abdominal pain, eager to  go home today.   Objective:   Blood pressure 119/66, pulse 63, temperature 97.8 F (36.6 C), temperature source Oral, resp. rate 16, height 5\' 11"  (1.803 m), weight 65.2 kg (143 lb 12.8 oz), SpO2 98 %.   Intake/Output Summary (Last 24 hours) at 03/01/16 1156 Last data filed at 03/01/16 1000  Gross per 24 hour  Intake              483 ml  Output              700 ml  Net             -217 ml    Exam  Awake Alert, hard of hearing  [place].AT,PERRAL Supple Neck,No JVD,  Symmetrical Chest wall movement, diminished air entry at the bases, but no respiratory distress or views poststress for muscle RRR,No Gallops,Rubs or new Murmurs, No Parasternal Heave +ve B.Sounds, Abd Soft, No tenderness,No rebound - guarding or rigidity. No Cyanosis, Clubbing or edema, No new Rash or bruise   Data Review   CBC w Diff: Lab Results  Component Value Date   WBC 6.5 02/27/2016   HGB 12.5 (L) 02/27/2016   HCT 38.8 (L) 02/27/2016   PLT 145 (L) 02/27/2016   LYMPHOPCT 18 01/12/2016   MONOPCT 9 01/12/2016   EOSPCT 5 01/12/2016   BASOPCT 0 01/12/2016    CMP: Lab Results  Component Value Date   NA 138 03/01/2016   K 3.7 03/01/2016   CL 101 03/01/2016   CO2 31 03/01/2016   BUN 27 (H) 03/01/2016   CREATININE 1.38 (H) 03/01/2016    CREATININE 1.33 (H) 01/12/2016   PROT 5.6 (L) 02/26/2016   ALBUMIN 3.4 (L) 02/26/2016   BILITOT 1.3 (H) 02/26/2016   ALKPHOS 75 02/26/2016   AST 30 02/26/2016   ALT 29 02/26/2016  .   Total Time in preparing paper work, data evaluation and todays exam - 35 minutes  French Kendra M.D on 03/01/2016 at 11:56 AM  [facility]   Office  [phone]

## 2016-03-01 NOTE — Discharge Instructions (Signed)
Follow with Primary MD MCKEOWN,WILLIAM DAVID, MD in 7 days   Get CBC, CMP, 2 view Chest X ray checked  by Primary MD next visit.    Activity: As tolerated with Full fall precautions use walker/cane & assistance as needed   Disposition Home    Diet: Heart Healthy , law salt with fluid restriction 1.5 L/day , with feeding assistance and aspiration precautions.  For Heart failure patients - Check your Weight same time everyday, if you gain over 2 pounds, or you develop in leg swelling, experience more shortness of breath or chest pain, call your Primary MD immediately. Follow Cardiac Low Salt Diet and 1.5 lit/day fluid restriction.   On your next visit with your primary care physician please Get Medicines reviewed and adjusted.   Please request your Prim.MD to go over all Hospital Tests and Procedure/Radiological results at the follow up, please get all Hospital records sent to your Prim MD by signing hospital release before you go home.   If you experience worsening of your admission symptoms, develop shortness of breath, life threatening emergency, suicidal or homicidal thoughts you must seek medical attention immediately by calling 911 or calling your MD immediately  if symptoms less severe.  You Must read complete instructions/literature along with all the possible adverse reactions/side effects for all the Medicines you take and that have been prescribed to you. Take any new Medicines after you have completely understood and accpet all the possible adverse reactions/side effects.   Do not drive, operating heavy machinery, perform activities at heights, swimming or participation in water activities or provide baby sitting services if your were admitted for syncope or siezures until you have seen by Primary MD or a Neurologist and advised to do so again.  Do not drive when taking Pain medications.    Do not take more than prescribed Pain, Sleep and Anxiety Medications  Special  Instructions: If you have smoked or chewed Tobacco  in the last 2 yrs please stop smoking, stop any regular Alcohol  and or any Recreational drug use.  Wear Seat belts while driving.   Please note  You were cared for by a hospitalist during your hospital stay. If you have any questions about your discharge medications or the care you received while you were in the hospital after you are discharged, you can call the unit and asked to speak with the hospitalist on call if the hospitalist that took care of you is not available. Once you are discharged, your primary care physician will handle any further medical issues. Please note that NO REFILLS for any discharge medications will be authorized once you are discharged, as it is imperative that you return to your primary care physician (or establish a relationship with a primary care physician if you do not have one) for your aftercare needs so that they can reassess your need for medications and monitor your lab values.

## 2016-03-01 NOTE — Telephone Encounter (Signed)
Pt's daughter called & scheduled a hosp f/u for pt.

## 2016-03-01 NOTE — Progress Notes (Signed)
Patient and daughter given discharge instructions and all questions answered.  Pt. Discharged via wheelchair with all belongings. 

## 2016-03-01 NOTE — Consult Note (Signed)
   [facility] CM Inpatient Consult   03/01/2016  [name] [name] 02-28-1928 [id number]   Patient was screened for [facility] services.  This is the patient's 1st hospitalization in the past 6 months.  He is 82 y.o. male with medical history significant of HTN, HLD, Prostate Cancer remote from 1999, dementia, CK D stage II, hypothyroidism and depression. Presents for SOB.Per staff he is DX with new onset HF.  He will be followed by home health care.  Met with patient who is alert and oriented to self and place. No family at the bedside. A brochure, magnet and contact information was left a the bedside for his daughter.  Please contact if there are any needs from the family.  Natividad Brood, RN BSN [facility] Liaison  [phone] business mobile phone Toll free office [phone]

## 2016-03-07 DIAGNOSIS — N183 Chronic kidney disease, stage 3 (moderate): Secondary | ICD-10-CM | POA: Diagnosis not present

## 2016-03-07 DIAGNOSIS — G309 Alzheimer's disease, unspecified: Secondary | ICD-10-CM | POA: Diagnosis not present

## 2016-03-07 DIAGNOSIS — I4510 Unspecified right bundle-branch block: Secondary | ICD-10-CM | POA: Diagnosis not present

## 2016-03-07 DIAGNOSIS — I5042 Chronic combined systolic (congestive) and diastolic (congestive) heart failure: Secondary | ICD-10-CM | POA: Diagnosis not present

## 2016-03-07 DIAGNOSIS — I130 Hypertensive heart and chronic kidney disease with heart failure and stage 1 through stage 4 chronic kidney disease, or unspecified chronic kidney disease: Secondary | ICD-10-CM | POA: Diagnosis not present

## 2016-03-07 DIAGNOSIS — J90 Pleural effusion, not elsewhere classified: Secondary | ICD-10-CM | POA: Diagnosis not present

## 2016-03-07 DIAGNOSIS — E785 Hyperlipidemia, unspecified: Secondary | ICD-10-CM | POA: Diagnosis not present

## 2016-03-07 DIAGNOSIS — F329 Major depressive disorder, single episode, unspecified: Secondary | ICD-10-CM | POA: Diagnosis not present

## 2016-03-07 DIAGNOSIS — F0390 Unspecified dementia without behavioral disturbance: Secondary | ICD-10-CM | POA: Diagnosis not present

## 2016-03-07 DIAGNOSIS — E291 Testicular hypofunction: Secondary | ICD-10-CM | POA: Diagnosis not present

## 2016-03-08 ENCOUNTER — Other Ambulatory Visit: Payer: Self-pay | Admitting: Internal Medicine

## 2016-03-08 ENCOUNTER — Encounter: Payer: Self-pay | Admitting: Internal Medicine

## 2016-03-08 ENCOUNTER — Ambulatory Visit (INDEPENDENT_AMBULATORY_CARE_PROVIDER_SITE_OTHER): Payer: Medicare Other | Admitting: Internal Medicine

## 2016-03-08 VITALS — BP 126/70 | HR 68 | Temp 98.0°F | Resp 16 | Ht 66.75 in | Wt 148.0 lb

## 2016-03-08 DIAGNOSIS — F0280 Dementia in other diseases classified elsewhere without behavioral disturbance: Secondary | ICD-10-CM | POA: Diagnosis not present

## 2016-03-08 DIAGNOSIS — I5043 Acute on chronic combined systolic (congestive) and diastolic (congestive) heart failure: Secondary | ICD-10-CM | POA: Diagnosis not present

## 2016-03-08 DIAGNOSIS — I10 Essential (primary) hypertension: Secondary | ICD-10-CM

## 2016-03-08 DIAGNOSIS — I4510 Unspecified right bundle-branch block: Secondary | ICD-10-CM | POA: Diagnosis not present

## 2016-03-08 DIAGNOSIS — G309 Alzheimer's disease, unspecified: Secondary | ICD-10-CM | POA: Diagnosis not present

## 2016-03-08 DIAGNOSIS — N183 Chronic kidney disease, stage 3 (moderate): Secondary | ICD-10-CM | POA: Diagnosis not present

## 2016-03-08 DIAGNOSIS — G301 Alzheimer's disease with late onset: Secondary | ICD-10-CM

## 2016-03-08 DIAGNOSIS — J90 Pleural effusion, not elsewhere classified: Secondary | ICD-10-CM | POA: Diagnosis not present

## 2016-03-08 DIAGNOSIS — I5042 Chronic combined systolic (congestive) and diastolic (congestive) heart failure: Secondary | ICD-10-CM | POA: Diagnosis not present

## 2016-03-08 DIAGNOSIS — Z79899 Other long term (current) drug therapy: Secondary | ICD-10-CM | POA: Diagnosis not present

## 2016-03-08 DIAGNOSIS — I130 Hypertensive heart and chronic kidney disease with heart failure and stage 1 through stage 4 chronic kidney disease, or unspecified chronic kidney disease: Secondary | ICD-10-CM | POA: Diagnosis not present

## 2016-03-08 LAB — CBC WITH DIFFERENTIAL/PLATELET
BASOS ABS: 50 {cells}/uL (ref 0–200)
Basophils Relative: 1 %
Eosinophils Absolute: 200 cells/uL (ref 15–500)
Eosinophils Relative: 4 %
HEMATOCRIT: 45.8 % (ref 38.5–50.0)
HEMOGLOBIN: 14.6 g/dL (ref 13.2–17.1)
LYMPHS ABS: 1100 {cells}/uL (ref 850–3900)
Lymphocytes Relative: 22 %
MCH: 27.9 pg (ref 27.0–33.0)
MCHC: 31.9 g/dL — AB (ref 32.0–36.0)
MCV: 87.4 fL (ref 80.0–100.0)
MONO ABS: 450 {cells}/uL (ref 200–950)
MPV: 11.8 fL (ref 7.5–12.5)
Monocytes Relative: 9 %
NEUTROS PCT: 64 %
Neutro Abs: 3200 cells/uL (ref 1500–7800)
Platelets: 164 10*3/uL (ref 140–400)
RBC: 5.24 MIL/uL (ref 4.20–5.80)
RDW: 15.7 % — AB (ref 11.0–15.0)
WBC: 5 10*3/uL (ref 3.8–10.8)

## 2016-03-08 LAB — BASIC METABOLIC PANEL WITH GFR
BUN: 31 mg/dL — AB (ref 7–25)
CALCIUM: 9.6 mg/dL (ref 8.6–10.3)
CO2: 25 mmol/L (ref 20–31)
Chloride: 104 mmol/L (ref 98–110)
Creat: 1.29 mg/dL — ABNORMAL HIGH (ref 0.70–1.11)
GFR, EST AFRICAN AMERICAN: 58 mL/min — AB (ref >=60)
GFR, Est Non African American: 50 mL/min — ABNORMAL LOW (ref >=60)
Glucose, Bld: 91 mg/dL (ref 65–99)
Potassium: 4.3 mmol/L (ref 3.5–5.3)
Sodium: 141 mmol/L (ref 135–146)

## 2016-03-08 MED ORDER — FUROSEMIDE 40 MG PO TABS
40.0000 mg | ORAL_TABLET | Freq: Every day | ORAL | 1 refills | Status: DC
Start: 2016-03-08 — End: 2016-03-27

## 2016-03-08 MED ORDER — POTASSIUM CHLORIDE ER 10 MEQ PO TBCR: 10.0000 meq | EXTENDED_RELEASE_TABLET | Freq: Every day | ORAL | 0 refills | Status: DC

## 2016-03-08 NOTE — Progress Notes (Signed)
Assessment and Plan: [phone] (all) [phone] (if 7 days high complexity)  hospital visit follow up for  Dyspnea on exertion, Acute on Chronic CHF exacerbation, New start on diuretic, SDAT -discussed the pathophysiology of the heart failure and what ejection fraction means to the patient's daughter.  On PE he appears to breathing normally.  No edema on examination.  He is tolerating lasix well.  BP stable.  Will check BMP and CBC due to new start on diuretics.  Will continue current plan.  Have also warned the patient's daughter that they must weight daily.  She denies a heavy salt diet.  She stated understanding to the above plan.   -Dementia does appear to be progressing  Over 40 minutes of exam, counseling, chart review, and complex, high/moderate level critical decision making was performed this visit.   HPI 83 y.o.male presents for follow up from the hospital. Admit date to the hospital was 02/25/16, patient was discharged from the hospital on 03/01/16 and our office contacted the office the day after discharge to set up a follow up appointment, patient was admitted for: acute dyspnea on exertion with bilateral pleural effusions.  He was found to have elevated BNP on admission and peripheral edema.  Echo was performed on him and showed 40% EF and new RBBB.  He was diuresed over the admission and his breathing improved dramatically.  Since being home he has tolerated the lasix well. His dementia is advancing but his daughter reports that he is moving around the house much better.  He is set up to start PT tomorrow.  He has a home health nurse who is weighing him daily.  They know to either call us or to call Dr. Kennon Holter office if greater than a 2 lbs weight gain.  He has no fevers, chills, CP, SOB, PND, Orthopnea,  Muscle spasms, or other complaints today.  He is managing his medications well with the help of his daughter.    Images while in the hospital: Dg Chest 2 View  Result Date:  02/25/2016 CLINICAL DATA:  Right lower lobe decreased breath sounds. Shortness of breath for 2-3 months. EXAM: CHEST  2 VIEW COMPARISON:  10/14/07 FINDINGS: There is moderate cardiac enlargement. Bilateral pleural effusions are identified right greater than left. Overlying areas of atelectasis are noted. IMPRESSION: 1. Bilateral pleural effusions right greater in left. 2. Bibasilar atelectasis. Electronically Signed   By: Kerby Moors M.D.   On: 02/25/2016 14:56    Past Medical History:  Diagnosis Date  . Depression   . HTN (hypertension)   . Hypothyroid   . Other testicular hypofunction   . Prediabetes   . Prostate cancer ([place])   . Stroke ([place])   . Vitamin D deficiency      Allergies  Allergen Reactions  . Penicillins Hives, Itching, Swelling and Rash    Blisters also      Current Outpatient Prescriptions on File Prior to Visit  Medication Sig Dispense Refill  . aspirin EC 81 MG tablet Take 81 mg by mouth daily.    . cholecalciferol (VITAMIN D) 1000 UNITS tablet Take 1,000 Units by mouth 2 (two) times daily.    Marland Kitchen escitalopram (LEXAPRO) 10 MG tablet Take 10 mg by mouth every morning.     . furosemide (LASIX) 40 MG tablet Take 1 tablet (40 mg total) by mouth daily. 30 tablet 0  . levothyroxine (SYNTHROID, LEVOTHROID) 75 MCG tablet Take 1 tablet (75 mcg total) by mouth daily. 90 tablet 1  .  Omega-3 Fatty Acids (FISH OIL PO) Take 1 capsule by mouth daily.    . potassium chloride (K-DUR) 10 MEQ tablet Take 1 tablet (10 mEq total) by mouth daily. 30 tablet 0  . QUEtiapine (SEROQUEL) 200 MG tablet Take 200 mg by mouth at bedtime.     No current facility-administered medications on file prior to visit.     ROS: all negative except above.   Physical Exam: Filed Weights   03/08/16 1443  Weight: 148 lb (67.1 kg)   BP 126/70   Pulse 68   Temp 98 F (36.7 C) (Temporal)   Resp 16   Ht 5' 6.75" (1.695 m)   Wt 148 lb (67.1 kg)   BMI 23.35 kg/m  General Appearance: Pleasant,  cooperative male, Well nourished, in no apparent distress. Eyes: PERRLA, EOMs, conjunctiva no swelling or erythema Sinuses: No Frontal/maxillary tenderness ENT/Mouth: Ext aud canals clear, TMs without erythema, bulging. No erythema, swelling, or exudate on post pharynx.  Tonsils not swollen or erythematous. Hearing normal. Denture plate in place on the top Neck: Supple, thyroid normal. No visible JVD Respiratory: Respiratory effort normal, BS equal bilaterally extending to the bilateral bases, without rales, rhonchi, wheezing or stridor.  Cardio: RRR with no 2/6 soft blowing murmur on the left sternal border radiating to the right 2nd ICS.  No RGs. Brisk peripheral pulses without edema.  Abdomen: Soft, + BS.  Non tender, no guarding, rebound, hernias, masses. Lymphatics: Non tender without lymphadenopathy.  Musculoskeletal: Full ROM, 5/5 strength, normal gait.  Skin: Warm, dry without rashes, lesions, ecchymosis.  Neuro: Cranial nerves intact. Normal muscle tone, no cerebellar symptoms. Sensation intact.  Psych: Awake and oriented to self.  Minimally conversive, flat affect.   Starlyn Skeans, PA-C 3:24 PM [facility]

## 2016-03-10 DIAGNOSIS — G309 Alzheimer's disease, unspecified: Secondary | ICD-10-CM | POA: Diagnosis not present

## 2016-03-10 DIAGNOSIS — N183 Chronic kidney disease, stage 3 (moderate): Secondary | ICD-10-CM | POA: Diagnosis not present

## 2016-03-10 DIAGNOSIS — I5042 Chronic combined systolic (congestive) and diastolic (congestive) heart failure: Secondary | ICD-10-CM | POA: Diagnosis not present

## 2016-03-10 DIAGNOSIS — J90 Pleural effusion, not elsewhere classified: Secondary | ICD-10-CM | POA: Diagnosis not present

## 2016-03-10 DIAGNOSIS — I4510 Unspecified right bundle-branch block: Secondary | ICD-10-CM | POA: Diagnosis not present

## 2016-03-10 DIAGNOSIS — I130 Hypertensive heart and chronic kidney disease with heart failure and stage 1 through stage 4 chronic kidney disease, or unspecified chronic kidney disease: Secondary | ICD-10-CM | POA: Diagnosis not present

## 2016-03-13 DIAGNOSIS — I5042 Chronic combined systolic (congestive) and diastolic (congestive) heart failure: Secondary | ICD-10-CM | POA: Diagnosis not present

## 2016-03-13 DIAGNOSIS — I130 Hypertensive heart and chronic kidney disease with heart failure and stage 1 through stage 4 chronic kidney disease, or unspecified chronic kidney disease: Secondary | ICD-10-CM | POA: Diagnosis not present

## 2016-03-13 DIAGNOSIS — J90 Pleural effusion, not elsewhere classified: Secondary | ICD-10-CM | POA: Diagnosis not present

## 2016-03-13 DIAGNOSIS — N183 Chronic kidney disease, stage 3 (moderate): Secondary | ICD-10-CM | POA: Diagnosis not present

## 2016-03-13 DIAGNOSIS — G309 Alzheimer's disease, unspecified: Secondary | ICD-10-CM | POA: Diagnosis not present

## 2016-03-13 DIAGNOSIS — I4510 Unspecified right bundle-branch block: Secondary | ICD-10-CM | POA: Diagnosis not present

## 2016-03-14 DIAGNOSIS — J90 Pleural effusion, not elsewhere classified: Secondary | ICD-10-CM | POA: Diagnosis not present

## 2016-03-14 DIAGNOSIS — I5042 Chronic combined systolic (congestive) and diastolic (congestive) heart failure: Secondary | ICD-10-CM | POA: Diagnosis not present

## 2016-03-14 DIAGNOSIS — G309 Alzheimer's disease, unspecified: Secondary | ICD-10-CM | POA: Diagnosis not present

## 2016-03-14 DIAGNOSIS — I4510 Unspecified right bundle-branch block: Secondary | ICD-10-CM | POA: Diagnosis not present

## 2016-03-14 DIAGNOSIS — I130 Hypertensive heart and chronic kidney disease with heart failure and stage 1 through stage 4 chronic kidney disease, or unspecified chronic kidney disease: Secondary | ICD-10-CM | POA: Diagnosis not present

## 2016-03-14 DIAGNOSIS — N183 Chronic kidney disease, stage 3 (moderate): Secondary | ICD-10-CM | POA: Diagnosis not present

## 2016-03-15 ENCOUNTER — Encounter: Payer: Self-pay | Admitting: Internal Medicine

## 2016-03-15 DIAGNOSIS — I5042 Chronic combined systolic (congestive) and diastolic (congestive) heart failure: Secondary | ICD-10-CM | POA: Diagnosis not present

## 2016-03-15 DIAGNOSIS — J90 Pleural effusion, not elsewhere classified: Secondary | ICD-10-CM | POA: Diagnosis not present

## 2016-03-15 DIAGNOSIS — I4510 Unspecified right bundle-branch block: Secondary | ICD-10-CM | POA: Diagnosis not present

## 2016-03-15 DIAGNOSIS — N183 Chronic kidney disease, stage 3 (moderate): Secondary | ICD-10-CM | POA: Diagnosis not present

## 2016-03-15 DIAGNOSIS — G309 Alzheimer's disease, unspecified: Secondary | ICD-10-CM | POA: Diagnosis not present

## 2016-03-15 DIAGNOSIS — I130 Hypertensive heart and chronic kidney disease with heart failure and stage 1 through stage 4 chronic kidney disease, or unspecified chronic kidney disease: Secondary | ICD-10-CM | POA: Diagnosis not present

## 2016-03-21 DIAGNOSIS — N183 Chronic kidney disease, stage 3 (moderate): Secondary | ICD-10-CM | POA: Diagnosis not present

## 2016-03-21 DIAGNOSIS — I5042 Chronic combined systolic (congestive) and diastolic (congestive) heart failure: Secondary | ICD-10-CM | POA: Diagnosis not present

## 2016-03-21 DIAGNOSIS — J90 Pleural effusion, not elsewhere classified: Secondary | ICD-10-CM | POA: Diagnosis not present

## 2016-03-21 DIAGNOSIS — I130 Hypertensive heart and chronic kidney disease with heart failure and stage 1 through stage 4 chronic kidney disease, or unspecified chronic kidney disease: Secondary | ICD-10-CM | POA: Diagnosis not present

## 2016-03-21 DIAGNOSIS — G309 Alzheimer's disease, unspecified: Secondary | ICD-10-CM | POA: Diagnosis not present

## 2016-03-21 DIAGNOSIS — I4510 Unspecified right bundle-branch block: Secondary | ICD-10-CM | POA: Diagnosis not present

## 2016-03-24 DIAGNOSIS — I130 Hypertensive heart and chronic kidney disease with heart failure and stage 1 through stage 4 chronic kidney disease, or unspecified chronic kidney disease: Secondary | ICD-10-CM | POA: Diagnosis not present

## 2016-03-24 DIAGNOSIS — I4510 Unspecified right bundle-branch block: Secondary | ICD-10-CM | POA: Diagnosis not present

## 2016-03-24 DIAGNOSIS — G309 Alzheimer's disease, unspecified: Secondary | ICD-10-CM | POA: Diagnosis not present

## 2016-03-24 DIAGNOSIS — J90 Pleural effusion, not elsewhere classified: Secondary | ICD-10-CM | POA: Diagnosis not present

## 2016-03-24 DIAGNOSIS — I5042 Chronic combined systolic (congestive) and diastolic (congestive) heart failure: Secondary | ICD-10-CM | POA: Diagnosis not present

## 2016-03-24 DIAGNOSIS — N183 Chronic kidney disease, stage 3 (moderate): Secondary | ICD-10-CM | POA: Diagnosis not present

## 2016-03-27 ENCOUNTER — Other Ambulatory Visit: Payer: Self-pay | Admitting: *Deleted

## 2016-03-27 MED ORDER — FUROSEMIDE 40 MG PO TABS: 40.0000 mg | ORAL_TABLET | Freq: Every day | ORAL | 1 refills | Status: DC

## 2016-03-27 MED ORDER — POTASSIUM CHLORIDE ER 10 MEQ PO TBCR: 10.0000 meq | EXTENDED_RELEASE_TABLET | Freq: Every day | ORAL | 0 refills | Status: DC

## 2016-03-28 DIAGNOSIS — I130 Hypertensive heart and chronic kidney disease with heart failure and stage 1 through stage 4 chronic kidney disease, or unspecified chronic kidney disease: Secondary | ICD-10-CM | POA: Diagnosis not present

## 2016-03-28 DIAGNOSIS — N183 Chronic kidney disease, stage 3 (moderate): Secondary | ICD-10-CM | POA: Diagnosis not present

## 2016-03-28 DIAGNOSIS — I4510 Unspecified right bundle-branch block: Secondary | ICD-10-CM | POA: Diagnosis not present

## 2016-03-28 DIAGNOSIS — J90 Pleural effusion, not elsewhere classified: Secondary | ICD-10-CM | POA: Diagnosis not present

## 2016-03-28 DIAGNOSIS — G309 Alzheimer's disease, unspecified: Secondary | ICD-10-CM | POA: Diagnosis not present

## 2016-03-28 DIAGNOSIS — I5042 Chronic combined systolic (congestive) and diastolic (congestive) heart failure: Secondary | ICD-10-CM | POA: Diagnosis not present

## 2016-03-29 ENCOUNTER — Encounter: Payer: Self-pay | Admitting: Cardiology

## 2016-03-29 ENCOUNTER — Ambulatory Visit (INDEPENDENT_AMBULATORY_CARE_PROVIDER_SITE_OTHER): Payer: Medicare Other | Admitting: Cardiology

## 2016-03-29 DIAGNOSIS — N183 Chronic kidney disease, stage 3 unspecified: Secondary | ICD-10-CM

## 2016-03-29 DIAGNOSIS — I4510 Unspecified right bundle-branch block: Secondary | ICD-10-CM

## 2016-03-29 DIAGNOSIS — F0390 Unspecified dementia without behavioral disturbance: Secondary | ICD-10-CM | POA: Insufficient documentation

## 2016-03-29 DIAGNOSIS — I5042 Chronic combined systolic (congestive) and diastolic (congestive) heart failure: Secondary | ICD-10-CM

## 2016-03-29 NOTE — Assessment & Plan Note (Signed)
CRI-3, BMP 03/08/16 stable

## 2016-03-29 NOTE — Assessment & Plan Note (Signed)
EF 40% by echo Dec 2017 Pt is here today for f/u from his admission in Dec

## 2016-03-29 NOTE — Assessment & Plan Note (Signed)
Mild dementia per family 

## 2016-03-29 NOTE — Progress Notes (Signed)
03/29/2016 [name] [name]   Jun 18, 1927  [id number]  Primary Physician MCKEOWN,WILLIAM DAVID, MD Primary Cardiologist: Dr Gwenlyn Found  HPI:  83 y/o male seen once in 2014 for sinus bradycardia and RBBB. I could find no history of CAD or prior cath in [facility] but the pt's daughter says he had a cath in the past (5 yrs ago) and has a "blockage in the right artery". He was admitted from his PCP's offcie in Dec with an "abnormal EKG" and CHF found on a wellness visit. Echo showed an EF of 40%. BNP on admission was 2000. He was diuresed. It's a little difficult to gauge symptoms as the pt never complains. Since discharge he has been doing well. He is able to walk to the mailbox everyday, about 1/2 mile. Labs done by his PCP 03/08/16 show stable stage 3 CRI. He is not on an ARB?ACE secondary to CRI and not on a beta blocker secondary to baseline bradycardia.    Current Outpatient Prescriptions  Medication Sig Dispense Refill  . aspirin EC 81 MG tablet Take 81 mg by mouth daily.    . cholecalciferol (VITAMIN D) 1000 UNITS tablet Take 1,000 Units by mouth 2 (two) times daily.    Marland Kitchen escitalopram (LEXAPRO) 10 MG tablet Take 10 mg by mouth every morning.     . furosemide (LASIX) 40 MG tablet Take 1 tablet (40 mg total) by mouth daily. 90 tablet 1  . levothyroxine (SYNTHROID, LEVOTHROID) 75 MCG tablet Take 1 tablet (75 mcg total) by mouth daily. 90 tablet 1  . Omega-3 Fatty Acids (FISH OIL PO) Take 1 capsule by mouth daily.    . potassium chloride (K-DUR) 10 MEQ tablet Take 1 tablet (10 mEq total) by mouth daily. 90 tablet 0  . QUEtiapine (SEROQUEL) 200 MG tablet Take 200 mg by mouth at bedtime.     No current facility-administered medications for this visit.     Allergies  Allergen Reactions  . Penicillins Hives, Itching, Swelling and Rash    Blisters also    Social History   Social History  . Marital status: Married    Spouse name: N/A  . Number of children: N/A  . Years of education: N/A    Occupational History  . Not on file.   Social History Main Topics  . Smoking status: Former Smoker    Packs/day: 1.00    Years: 10.00    Types: Cigarettes    Quit date: 12/26/1963  . Smokeless tobacco: Never Used  . Alcohol use No  . Drug use: No  . Sexual activity: Not on file   Other Topics Concern  . Not on file   Social History Narrative  . No narrative on file     Review of Systems: General: negative for chills, fever, night sweats or weight changes.  Cardiovascular: negative for chest pain, dyspnea on exertion, edema, orthopnea, palpitations, paroxysmal nocturnal dyspnea or shortness of breath Dermatological: negative for rash Respiratory: negative for cough or wheezing Urologic: negative for hematuria Abdominal: negative for nausea, vomiting, diarrhea, bright red blood per rectum, melena, or hematemesis Neurologic: negative for visual changes, syncope, or dizziness HOH- hearing aid in place All other systems reviewed and are otherwise negative except as noted above.    Blood pressure 127/72, pulse 71, height 5' 6.75" (1.695 m), weight 149 lb (67.6 kg).  General appearance: alert, cooperative and no distress Neck: no carotid bruit and no JVD Lungs: few dry crackles Lt base Heart: regular rate and  rhythm and short mid systolic murmur LSB, apex Extremities: no edema Pulses: 2+ and symmetric Skin: Skin color, texture, turgor normal. No rashes or lesions Neurologic: Grossly normal   ASSESSMENT AND PLAN:   Chronic combined systolic and diastolic congestive heart failure (HCC) EF 40% by echo Dec 2017 Pt is here today for f/u from his admission in Dec  CKD (chronic kidney disease) stage 3, GFR 30-59 ml/min CRI-3, BMP 03/08/16 stable  Dementia without behavioral disturbance Mild dementia per family  RBBB RBBB and baseline bradycardia   PLAN  Myoview had been considered to r/o ischemic cardiomyopathy but the pt appears to be pretty frail and has significant  renal insufficieny. He is doing well on his current medications. Same Rx for now, f/u Dr Gwenlyn Found in 3 months- then every 6 -1 year if stable.   Kerin Ransom PA-C 03/29/2016 10:39 AM

## 2016-03-29 NOTE — Patient Instructions (Signed)
Medication Instructions:  Your physician recommends that you continue on your current medications as directed. Please refer to the Current Medication list given to you today.   Labwork: None ordered  Testing/Procedures: None ordered  Follow-Up: Your physician recommends that you schedule a follow-up appointment in: 3 months with Dr.Berry   Any Other Special Instructions Will Be Listed Below (If Applicable).     If you need a refill on your cardiac medications before your next appointment, please call your pharmacy.   

## 2016-03-29 NOTE — Assessment & Plan Note (Signed)
RBBB and baseline bradycardia

## 2016-03-30 DIAGNOSIS — N183 Chronic kidney disease, stage 3 (moderate): Secondary | ICD-10-CM | POA: Diagnosis not present

## 2016-03-30 DIAGNOSIS — G309 Alzheimer's disease, unspecified: Secondary | ICD-10-CM | POA: Diagnosis not present

## 2016-03-30 DIAGNOSIS — I130 Hypertensive heart and chronic kidney disease with heart failure and stage 1 through stage 4 chronic kidney disease, or unspecified chronic kidney disease: Secondary | ICD-10-CM | POA: Diagnosis not present

## 2016-03-30 DIAGNOSIS — I5042 Chronic combined systolic (congestive) and diastolic (congestive) heart failure: Secondary | ICD-10-CM | POA: Diagnosis not present

## 2016-03-30 DIAGNOSIS — J90 Pleural effusion, not elsewhere classified: Secondary | ICD-10-CM | POA: Diagnosis not present

## 2016-03-30 DIAGNOSIS — I4510 Unspecified right bundle-branch block: Secondary | ICD-10-CM | POA: Diagnosis not present

## 2016-04-03 DIAGNOSIS — I5042 Chronic combined systolic (congestive) and diastolic (congestive) heart failure: Secondary | ICD-10-CM | POA: Diagnosis not present

## 2016-04-03 DIAGNOSIS — I130 Hypertensive heart and chronic kidney disease with heart failure and stage 1 through stage 4 chronic kidney disease, or unspecified chronic kidney disease: Secondary | ICD-10-CM | POA: Diagnosis not present

## 2016-04-03 DIAGNOSIS — N183 Chronic kidney disease, stage 3 (moderate): Secondary | ICD-10-CM | POA: Diagnosis not present

## 2016-04-03 DIAGNOSIS — G309 Alzheimer's disease, unspecified: Secondary | ICD-10-CM | POA: Diagnosis not present

## 2016-04-03 DIAGNOSIS — J90 Pleural effusion, not elsewhere classified: Secondary | ICD-10-CM | POA: Diagnosis not present

## 2016-04-03 DIAGNOSIS — I4510 Unspecified right bundle-branch block: Secondary | ICD-10-CM | POA: Diagnosis not present

## 2016-04-05 DIAGNOSIS — G309 Alzheimer's disease, unspecified: Secondary | ICD-10-CM | POA: Diagnosis not present

## 2016-04-05 DIAGNOSIS — J90 Pleural effusion, not elsewhere classified: Secondary | ICD-10-CM | POA: Diagnosis not present

## 2016-04-05 DIAGNOSIS — I5042 Chronic combined systolic (congestive) and diastolic (congestive) heart failure: Secondary | ICD-10-CM | POA: Diagnosis not present

## 2016-04-05 DIAGNOSIS — I130 Hypertensive heart and chronic kidney disease with heart failure and stage 1 through stage 4 chronic kidney disease, or unspecified chronic kidney disease: Secondary | ICD-10-CM | POA: Diagnosis not present

## 2016-04-05 DIAGNOSIS — N183 Chronic kidney disease, stage 3 (moderate): Secondary | ICD-10-CM | POA: Diagnosis not present

## 2016-04-05 DIAGNOSIS — I4510 Unspecified right bundle-branch block: Secondary | ICD-10-CM | POA: Diagnosis not present

## 2016-04-06 DIAGNOSIS — J90 Pleural effusion, not elsewhere classified: Secondary | ICD-10-CM | POA: Diagnosis not present

## 2016-04-06 DIAGNOSIS — N183 Chronic kidney disease, stage 3 (moderate): Secondary | ICD-10-CM | POA: Diagnosis not present

## 2016-04-06 DIAGNOSIS — I5042 Chronic combined systolic (congestive) and diastolic (congestive) heart failure: Secondary | ICD-10-CM | POA: Diagnosis not present

## 2016-04-06 DIAGNOSIS — I4510 Unspecified right bundle-branch block: Secondary | ICD-10-CM | POA: Diagnosis not present

## 2016-04-06 DIAGNOSIS — I130 Hypertensive heart and chronic kidney disease with heart failure and stage 1 through stage 4 chronic kidney disease, or unspecified chronic kidney disease: Secondary | ICD-10-CM | POA: Diagnosis not present

## 2016-04-06 DIAGNOSIS — G309 Alzheimer's disease, unspecified: Secondary | ICD-10-CM | POA: Diagnosis not present

## 2016-04-13 ENCOUNTER — Observation Stay (HOSPITAL_COMMUNITY)
Admission: EM | Admit: 2016-04-13 | Discharge: 2016-04-14 | Disposition: A | Discharge status: Home / Self Care | Payer: Medicare Other | Attending: Internal Medicine | Admitting: Internal Medicine

## 2016-04-13 ENCOUNTER — Encounter (HOSPITAL_COMMUNITY): Payer: Self-pay | Admitting: Emergency Medicine

## 2016-04-13 ENCOUNTER — Emergency Department (HOSPITAL_COMMUNITY): Payer: Medicare Other

## 2016-04-13 DIAGNOSIS — E291 Testicular hypofunction: Secondary | ICD-10-CM | POA: Insufficient documentation

## 2016-04-13 DIAGNOSIS — R001 Bradycardia, unspecified: Secondary | ICD-10-CM | POA: Diagnosis not present

## 2016-04-13 DIAGNOSIS — J90 Pleural effusion, not elsewhere classified: Secondary | ICD-10-CM

## 2016-04-13 DIAGNOSIS — F0390 Unspecified dementia without behavioral disturbance: Secondary | ICD-10-CM | POA: Diagnosis present

## 2016-04-13 DIAGNOSIS — F325 Major depressive disorder, single episode, in full remission: Secondary | ICD-10-CM | POA: Diagnosis not present

## 2016-04-13 DIAGNOSIS — N183 Chronic kidney disease, stage 3 unspecified: Secondary | ICD-10-CM | POA: Diagnosis present

## 2016-04-13 DIAGNOSIS — Z7982 Long term (current) use of aspirin: Secondary | ICD-10-CM | POA: Diagnosis not present

## 2016-04-13 DIAGNOSIS — Z87891 Personal history of nicotine dependence: Secondary | ICD-10-CM | POA: Diagnosis not present

## 2016-04-13 DIAGNOSIS — E559 Vitamin D deficiency, unspecified: Secondary | ICD-10-CM | POA: Diagnosis not present

## 2016-04-13 DIAGNOSIS — I4510 Unspecified right bundle-branch block: Secondary | ICD-10-CM | POA: Insufficient documentation

## 2016-04-13 DIAGNOSIS — Z880 Allergy status to penicillin: Secondary | ICD-10-CM | POA: Diagnosis not present

## 2016-04-13 DIAGNOSIS — D631 Anemia in chronic kidney disease: Secondary | ICD-10-CM | POA: Diagnosis not present

## 2016-04-13 DIAGNOSIS — D649 Anemia, unspecified: Secondary | ICD-10-CM | POA: Diagnosis not present

## 2016-04-13 DIAGNOSIS — R072 Precordial pain: Secondary | ICD-10-CM

## 2016-04-13 DIAGNOSIS — F0280 Dementia in other diseases classified elsewhere without behavioral disturbance: Secondary | ICD-10-CM | POA: Insufficient documentation

## 2016-04-13 DIAGNOSIS — J810 Acute pulmonary edema: Secondary | ICD-10-CM | POA: Diagnosis not present

## 2016-04-13 DIAGNOSIS — Z79899 Other long term (current) drug therapy: Secondary | ICD-10-CM | POA: Insufficient documentation

## 2016-04-13 DIAGNOSIS — E039 Hypothyroidism, unspecified: Secondary | ICD-10-CM | POA: Diagnosis present

## 2016-04-13 DIAGNOSIS — I129 Hypertensive chronic kidney disease with stage 1 through stage 4 chronic kidney disease, or unspecified chronic kidney disease: Secondary | ICD-10-CM | POA: Diagnosis not present

## 2016-04-13 DIAGNOSIS — Z66 Do not resuscitate: Secondary | ICD-10-CM | POA: Insufficient documentation

## 2016-04-13 DIAGNOSIS — Z8546 Personal history of malignant neoplasm of prostate: Secondary | ICD-10-CM | POA: Diagnosis not present

## 2016-04-13 DIAGNOSIS — I959 Hypotension, unspecified: Secondary | ICD-10-CM

## 2016-04-13 DIAGNOSIS — D696 Thrombocytopenia, unspecified: Secondary | ICD-10-CM | POA: Diagnosis not present

## 2016-04-13 DIAGNOSIS — R7303 Prediabetes: Secondary | ICD-10-CM | POA: Diagnosis not present

## 2016-04-13 DIAGNOSIS — Z8551 Personal history of malignant neoplasm of bladder: Secondary | ICD-10-CM | POA: Diagnosis not present

## 2016-04-13 DIAGNOSIS — H9190 Unspecified hearing loss, unspecified ear: Secondary | ICD-10-CM | POA: Diagnosis not present

## 2016-04-13 DIAGNOSIS — R079 Chest pain, unspecified: Principal | ICD-10-CM | POA: Insufficient documentation

## 2016-04-13 DIAGNOSIS — Z8673 Personal history of transient ischemic attack (TIA), and cerebral infarction without residual deficits: Secondary | ICD-10-CM | POA: Insufficient documentation

## 2016-04-13 HISTORY — DX: Malignant neoplasm of bladder, unspecified: C67.9

## 2016-04-13 LAB — BASIC METABOLIC PANEL
ANION GAP: 11 (ref 5–15)
BUN: 30 mg/dL — ABNORMAL HIGH (ref 6–20)
CHLORIDE: 105 mmol/L (ref 101–111)
CO2: 19 mmol/L — ABNORMAL LOW (ref 22–32)
Calcium: 8.7 mg/dL — ABNORMAL LOW (ref 8.9–10.3)
Creatinine, Ser: 1.16 mg/dL (ref 0.61–1.24)
GFR calc Af Amer: >60 mL/min (ref >60)
GFR, EST NON AFRICAN AMERICAN: 55 mL/min — AB (ref >60)
GLUCOSE: 122 mg/dL — AB (ref 65–99)
POTASSIUM: 3.9 mmol/L (ref 3.5–5.1)
Sodium: 135 mmol/L (ref 135–145)

## 2016-04-13 LAB — CBC
HEMATOCRIT: 34.1 % — AB (ref 39.0–52.0)
HEMOGLOBIN: 11 g/dL — AB (ref 13.0–17.0)
MCH: 27.4 pg (ref 26.0–34.0)
MCHC: 32.3 g/dL (ref 30.0–36.0)
MCV: 85 fL (ref 78.0–100.0)
Platelets: 129 10*3/uL — ABNORMAL LOW (ref 150–400)
RBC: 4.01 MIL/uL — ABNORMAL LOW (ref 4.22–5.81)
RDW: 17.4 % — ABNORMAL HIGH (ref 11.5–15.5)
WBC: 6.8 10*3/uL (ref 4.0–10.5)

## 2016-04-13 LAB — I-STAT TROPONIN, ED: Troponin i, poc: 0.06 ng/mL (ref 0.00–0.08)

## 2016-04-13 LAB — TSH: TSH: 4.9 u[IU]/mL — AB (ref 0.350–4.500)

## 2016-04-13 MED ORDER — ACETAMINOPHEN 325 MG PO TABS: 650.0000 mg | ORAL_TABLET | ORAL | Status: DC | PRN

## 2016-04-13 MED ORDER — ESCITALOPRAM OXALATE 10 MG PO TABS
10.0000 mg | ORAL_TABLET | Freq: Every day | ORAL | Status: DC
  Administered 2016-04-14: 10 mg via ORAL
  Filled 2016-04-13: qty 1

## 2016-04-13 MED ORDER — POTASSIUM CHLORIDE ER 10 MEQ PO TBCR
10.0000 meq | EXTENDED_RELEASE_TABLET | Freq: Every day | ORAL | Status: DC
  Administered 2016-04-14: 10 meq via ORAL
  Filled 2016-04-13 (×2): qty 1

## 2016-04-13 MED ORDER — ONDANSETRON HCL 4 MG/2ML IJ SOLN: 4.0000 mg | Freq: Four times a day (QID) | INTRAMUSCULAR | Status: DC | PRN

## 2016-04-13 MED ORDER — GI COCKTAIL ~~LOC~~: 30.0000 mL | Freq: Four times a day (QID) | ORAL | Status: DC | PRN

## 2016-04-13 MED ORDER — MORPHINE SULFATE (PF) 2 MG/ML IV SOLN: 2.0000 mg | INTRAVENOUS | Status: DC | PRN

## 2016-04-13 MED ORDER — QUETIAPINE FUMARATE 25 MG PO TABS
200.0000 mg | ORAL_TABLET | Freq: Every day | ORAL | Status: DC
  Administered 2016-04-14: 200 mg via ORAL
  Filled 2016-04-13: qty 8

## 2016-04-13 MED ORDER — ENOXAPARIN SODIUM 30 MG/0.3ML ~~LOC~~ SOLN
30.0000 mg | Freq: Every day | SUBCUTANEOUS | Status: DC
  Administered 2016-04-14: 30 mg via SUBCUTANEOUS
  Filled 2016-04-13: qty 0.3

## 2016-04-13 MED ORDER — ASPIRIN EC 81 MG PO TBEC
81.0000 mg | DELAYED_RELEASE_TABLET | Freq: Every day | ORAL | Status: DC
  Administered 2016-04-14: 81 mg via ORAL
  Filled 2016-04-13: qty 1

## 2016-04-13 MED ORDER — LEVOTHYROXINE SODIUM 75 MCG PO TABS
75.0000 ug | ORAL_TABLET | Freq: Every day | ORAL | Status: DC
  Administered 2016-04-14: 75 ug via ORAL
  Filled 2016-04-13: qty 1

## 2016-04-13 MED ORDER — FUROSEMIDE 40 MG PO TABS
40.0000 mg | ORAL_TABLET | Freq: Every day | ORAL | Status: DC
  Administered 2016-04-14: 40 mg via ORAL
  Filled 2016-04-13: qty 1

## 2016-04-13 NOTE — H&P (Signed)
History and Physical    HART BART [id number] DOB: December 20, 1927 DOA: 04/13/2016  PCP: Alesia Richards, MD Consultants:  Cardiology - Gwenlyn Found; [place] - urology; [place] Patient coming from: home - lives with daughter and her husband; NOK: daughter, [phone]  Chief Complaint: possible chest pain  HPI: [name] [name] is a 83 y.o. male with medical history significant of remote CVA, prostate/bladder CA, hypothyroidism, and HTN now controlled off medications presenting with possible chest pain.  The patient has dementia and so the history is uncertain.  His daughter reports that he took his antidepressant.  About an hour later he said he was going to bed.  Usually walks down hall, gets ready, and takes his hearing aids out.  Tonight, about 5 minutes after he went to his room he started calling his daughter.  She went to check on him and he was sitting in the dark.  He told her he couldn't get up and needed help in getting to bed.  He had not gotten dressed.  He said he hurt all over.  Complained of chest pain but also all over pain.  911 told her to give him 4 ASA.  BP was 90/50 both sitting and standing.  He has dementia and it is not clear whether his history is accurate, unsure if he really had chest pain.  He did fall this AM while the attendant was there.  He did not have his glasses on in the bathroom.  Possibly trying to sit down on commode and missed.  No injury.  He was even able to walk to the mailbox and back today without difficulty after the fall.  He was hospitalized from 12/22-27 with DOE, pleural effusion.  Echo with EF 40%, severe hypokinesis in the inferior myocardium.  He followed up with cardiology on 03/29/16.  There was discussion of myoview study to evaluate for ischemic cardiomyopathy but it was felt that the patient's overall condition was such that this test was not indicated at this time.   ED Course: he did have an episode of hypotension and bradycardia  en route that was treated with 0.5 of Atropine and IVF; this problem has resolved  Review of Systems: unable to obtain  Ambulatory Status:  Ambulates without assistance; IADLs  Past Medical History:  Diagnosis Date  . Bladder cancer ([place])   . Depression   . HTN (hypertension)    off medications  . Hypothyroid   . Other testicular hypofunction   . Prediabetes   . Prostate cancer ([place])   . Stroke ([place]) 1980  . Vitamin D deficiency     Past Surgical History:  Procedure Laterality Date  . BIOPSY PROSTATE  2/99   pos for adenocarcinoma of prostate  . CYSTOURETHROSCOPY  7/99   and TUR of bladder cancer  . PROSTATE SURGERY  2007    Social History   Social History  . Marital status: Married    Spouse name: N/A  . Number of children: N/A  . Years of education: N/A   Occupational History  . retired    Social History Main Topics  . Smoking status: Former Smoker    Packs/day: 1.00    Years: 10.00    Types: Cigarettes    Quit date: 12/26/1963  . Smokeless tobacco: Never Used  . Alcohol use No  . Drug use: No  . Sexual activity: Not on file   Other Topics Concern  . Not on file   Social History Narrative  .  No narrative on file    Allergies  Allergen Reactions  . Penicillins Hives, Itching, Swelling and Rash    Blisters also Has patient had a PCN reaction causing immediate rash, facial/tongue/throat swelling, SOB or lightheadedness with hypotension: Yes Has patient had a PCN reaction causing severe rash involving mucus membranes or skin necrosis: Unk Has patient had a PCN reaction that required hospitalization: No (was in hospital) Has patient had a PCN reaction occurring within the last 10 years: No If all of the above answers are "NO", then may proceed with Cephalosporin use.     Family History  Problem Relation Age of Onset  . Heart disease Mother   . Other Father     Brain tumor  . Thyroid disease Father     Prior to Admission medications     Medication Sig Start Date End Date Taking? Authorizing Provider  aspirin EC 81 MG tablet Take 81 mg by mouth daily.   Yes Historical Provider, MD  cholecalciferol (VITAMIN D) 1000 UNITS tablet Take 2,000 Units by mouth every morning.    Yes Historical Provider, MD  escitalopram (LEXAPRO) 10 MG tablet Take 10 mg by mouth every morning.    Yes Historical Provider, MD  furosemide (LASIX) 40 MG tablet Take 1 tablet (40 mg total) by mouth daily. 03/27/16  Yes Courtney Forcucci, PA-C  levothyroxine (SYNTHROID, LEVOTHROID) 75 MCG tablet Take 1 tablet (75 mcg total) by mouth daily. 05/26/15  Yes Courtney Forcucci, PA-C  Omega-3 Fatty Acids (FISH OIL PO) Take 1 capsule by mouth daily.   Yes Historical Provider, MD  potassium chloride (K-DUR) 10 MEQ tablet Take 1 tablet (10 mEq total) by mouth daily. 03/27/16  Yes Courtney Forcucci, PA-C  QUEtiapine (SEROQUEL) 200 MG tablet Take 200 mg by mouth at bedtime.   Yes Historical Provider, MD    Physical Exam: Vitals:   04/13/16 2030 04/13/16 2031 04/13/16 2115 04/13/16 2230  BP: 115/66 110/69 101/62 110/61  Pulse: 70 72 (!) 59 (!) 55  Resp: 14 14 12 20   Temp:  97.6 F (36.4 C)    TempSrc:  Oral    SpO2: 100% 99% 98% 93%     General: Appears calm and comfortable and is NAD, sleeping, answers questions when awakened and then returns to sleep Eyes:  EOMI, normal lids, iris ENT: hard of hearing, normal lips & tongue, mmm Neck:  no LAD, masses or thyromegaly Cardiovascular:  RRR, no m/r/g. No LE edema.  Respiratory:  CTA bilaterally, no w/r/r. Normal respiratory effort. Abdomen:  soft, ntnd, NABS Skin:  no rash or induration seen on limited exam Musculoskeletal:  grossly normal tone BUE/BLE, good ROM, no bony abnormality Psychiatric:  grossly normal mood and affect, speech fluent and appropriate, AOx2 Neurologic:  CN 2-12 grossly intact, moves all extremities in coordinated fashion, sensation intact  Labs on Admission: I have personally reviewed  following labs and imaging studies  CBC:  Recent Labs Lab 04/13/16 2036  WBC 6.8  HGB 11.0*  HCT 34.1*  MCV 85.0  PLT [id number]*   Basic Metabolic Panel:  Recent Labs Lab 04/13/16 2036  NA 135  K 3.9  CL 105  CO2 19*  GLUCOSE 122*  BUN 30*  CREATININE 1.16  CALCIUM 8.7*   GFR: CrCl cannot be calculated (Unknown ideal weight.). Liver Function Tests: No results for input(s): AST, ALT, ALKPHOS, BILITOT, PROT, ALBUMIN in the last 168 hours. No results for input(s): LIPASE, AMYLASE in the last 168 hours. No results for input(s): AMMONIA  in the last 168 hours. Coagulation Profile: No results for input(s): INR, PROTIME in the last 168 hours. Cardiac Enzymes: No results for input(s): CKTOTAL, CKMB, CKMBINDEX, TROPONINI in the last 168 hours. BNP (last 3 results) No results for input(s): PROBNP in the last 8760 hours. HbA1C: No results for input(s): HGBA1C in the last 72 hours. CBG: No results for input(s): GLUCAP in the last 168 hours. Lipid Profile: No results for input(s): CHOL, HDL, LDLCALC, TRIG, CHOLHDL, LDLDIRECT in the last 72 hours. Thyroid Function Tests:  Recent Labs  04/13/16 2036  TSH 4.900*   Anemia Panel: No results for input(s): VITAMINB12, FOLATE, FERRITIN, TIBC, IRON, RETICCTPCT in the last 72 hours. Urine analysis:    Component Value Date/Time   COLORURINE DARK YELLOW 02/22/2015 1639   APPEARANCEUR CLEAR 02/22/2015 1639   LABSPEC 1.023 02/22/2015 1639   PHURINE 5.5 02/22/2015 1639   GLUCOSEU NEGATIVE 02/22/2015 1639   HGBUR NEGATIVE 02/22/2015 1639   BILIRUBINUR NEGATIVE 02/22/2015 1639   KETONESUR TRACE (A) 02/22/2015 1639   PROTEINUR NEGATIVE 02/22/2015 1639   NITRITE NEGATIVE 02/22/2015 1639   LEUKOCYTESUR NEGATIVE 02/22/2015 1639    Creatinine Clearance: CrCl cannot be calculated (Unknown ideal weight.).  Sepsis Labs: @LABRCNTIP (procalcitonin:4,lacticidven:4) )No results found for this or any previous visit (from the past 240  hour(s)).   Radiological Exams on Admission: Dg Chest Portable 1 View  Result Date: 04/13/2016 CLINICAL DATA:  Chest pain.  Sinus bradycardia. EXAM: PORTABLE CHEST 1 VIEW COMPARISON:  02/27/2016 FINDINGS: Shallow inspiration with elevation of the right hemidiaphragm. Probable small bilateral pleural effusions. Atelectasis in the lung bases. Mild cardiac enlargement with mild pulmonary vascular congestion. No edema. No pneumothorax. Calcified and tortuous aorta. Degenerative changes in the spine and shoulders peer IMPRESSION: Small bilateral pleural effusions with basilar atelectasis. Cardiac enlargement with mild pulmonary vascular congestion. No edema. Electronically Signed   By: Lucienne Capers M.D.   On: 04/13/2016 21:05    EKG: Independently reviewed.  NSR with rate 73; RBBB with no evidence of acute ischemia  Assessment/Plan Principal Problem:   Chest pain Active Problems:   Hypothyroid   CKD (chronic kidney disease) stage 3, GFR 30-59 ml/min   Dementia without behavioral disturbance   Thrombocytopenia (HCC)   Anemia   Chest pain -Patient with possible substernal or left-sided chest pain; additional history cannot be obtained due to patient's dementia. -CXR unremarkable.   -Initial cardiac troponin negative. -EKG not indicative of acute ischemia.   -Will plan to place in observation status on telemetry to rule out ACS by overnight observation.  -cycle troponin q6h x 3 and repeat EKG in AM -Continue ASA 81 mg daily -morphine given -Cardiology consultation by Dr.Berry in AM; he is not NPO -The primary question for consultation is whether the patient would benefit from stress testing since he would likely only be treated with medication management.  And with that, if the stress test is not reasonable, the family needs guidance about when it would be appropriate to bring the patient in to the ER.  Dementia -Basically unable to provide a history -No known behavioral  disturbance -Will place on fall precautions  Anemia -Hgb 11.0, prior was 14.6 on 03/08/16 but was 12.5 on 02/27/16 -Normocytic, possibly associated with CKD -Will follow  Thrombocytopenia -Platelets 129, prior was 164, 145 on [id number] -Uncertain etiology -Hold Heparin-containing products and NSAIDs if <100  Hypothyroidism -TSH 4.9, 1.173 on 12/23 -Suggest repeat TSH and free T4 in 4-6 weeks  CKD -Creatinine 1.16, improved  DVT prophylaxis:  Lovenox  Code Status: DNR - confirmed with family Family Communication: Daughter and son-in-law present throughout evaluation Disposition Plan:  Home once clinically improved Consults called: Cardiology via inbox message  Admission status: It is my clinical opinion that referral for OBSERVATION is reasonable and necessary in this patient based on the above information provided. The aforementioned taken together are felt to place the patient at high risk for further clinical deterioration. However it is anticipated that the patient may be medically stable for discharge from the hospital within 24 to 48 hours.    Karmen Bongo MD [facility]  If 7PM-7AM, please contact night-coverage [email or web address] Password [facility]  04/13/2016, 11:41 PM

## 2016-04-13 NOTE — ED Provider Notes (Signed)
[facility] DEPT Provider Note  CSN: [id number] Arrival date & time: 04/13/16  2013  History   Chief Complaint Chief Complaint  Patient presents with  . Chest Pain  . Bradycardia   HPI [name] [name] is a 83 y.o. male.  The history is provided by the patient and medical records. No language interpreter was used.  Illness  This is a new problem. The current episode started 1 to 2 hours ago. The problem occurs constantly. The problem has not changed since onset.Associated symptoms include chest pain and shortness of breath. Pertinent negatives include no abdominal pain and no headaches. The symptoms are aggravated by exertion. The symptoms are relieved by rest.   Past Medical History:  Diagnosis Date  . Bladder cancer ([place])   . Depression   . HTN (hypertension)    off medications  . Hypothyroid   . Other testicular hypofunction   . Prediabetes   . Prostate cancer ([place])   . Stroke ([place]) 1980  . Vitamin D deficiency    Patient Active Problem List   Diagnosis Date Noted  . Chest pain 04/13/2016  . Thrombocytopenia ([place]) 04/13/2016  . Anemia 04/13/2016  . Dementia without behavioral disturbance 03/29/2016  . Chronic combined systolic and diastolic congestive heart failure ([place])   . History of prostate cancer 02/25/2016  . History of bladder cancer 02/25/2016  . Bilateral pleural effusion 02/25/2016  . Acute respiratory failure ([place]) 02/25/2016  . CKD (chronic kidney disease) stage 3, GFR 30-59 ml/min 02/25/2016  . Depression, major, in remission ([place]) 02/22/2015  . SDAT (senile dementia of Alzheimer's type) 10/07/2014  . Overactive bladder 03/10/2014  . Hyperlipidemia 04/02/2013  . Medication management 04/02/2013  . Hypothyroid   . Vitamin D deficiency   . Prediabetes   . Testosterone deficiency   . RBBB 12/25/2012  . HTN (hypertension) 12/25/2012   Past Surgical History:  Procedure Laterality Date  . BIOPSY PROSTATE  2/99   pos for adenocarcinoma of prostate    . CYSTOURETHROSCOPY  7/99   and TUR of bladder cancer  . PROSTATE SURGERY  2007   Home Medications    Prior to Admission medications   Medication Sig Start Date End Date Taking? Authorizing Provider  aspirin EC 81 MG tablet Take 81 mg by mouth daily.   Yes Historical Provider, MD  cholecalciferol (VITAMIN D) 1000 UNITS tablet Take 2,000 Units by mouth every morning.    Yes Historical Provider, MD  escitalopram (LEXAPRO) 10 MG tablet Take 10 mg by mouth every morning.    Yes Historical Provider, MD  furosemide (LASIX) 40 MG tablet Take 1 tablet (40 mg total) by mouth daily. 03/27/16  Yes Courtney Forcucci, PA-C  levothyroxine (SYNTHROID, LEVOTHROID) 75 MCG tablet Take 1 tablet (75 mcg total) by mouth daily. 05/26/15  Yes Courtney Forcucci, PA-C  Omega-3 Fatty Acids (FISH OIL PO) Take 1 capsule by mouth daily.   Yes Historical Provider, MD  potassium chloride (K-DUR) 10 MEQ tablet Take 1 tablet (10 mEq total) by mouth daily. 03/27/16  Yes Courtney Forcucci, PA-C  QUEtiapine (SEROQUEL) 200 MG tablet Take 200 mg by mouth at bedtime.   Yes Historical Provider, MD   Family History Family History  Problem Relation Age of Onset  . Heart disease Mother   . Other Father     Brain tumor  . Thyroid disease Father    Social History Social History  Substance Use Topics  . Smoking status: Former Smoker    Packs/day: 1.00  Years: 10.00    Types: Cigarettes    Quit date: 12/26/1963  . Smokeless tobacco: Never Used  . Alcohol use No   Allergies   Penicillins  Review of Systems Review of Systems  Respiratory: Positive for shortness of breath.   Cardiovascular: Positive for chest pain.  Gastrointestinal: Negative for abdominal pain.  Neurological: Negative for headaches.  All other systems reviewed and are negative.  Physical Exam Updated Vital Signs BP 113/67 (BP Location: Left Arm)   Pulse (!) 58   Temp 97.5 F (36.4 C) (Oral)   Resp 18   Wt 67.3 kg   SpO2 97%   BMI 23.42 kg/m    Physical Exam  Constitutional: He is oriented to person, place, and time. No distress.  Elderly thin male  HENT:  Head: Normocephalic and atraumatic.  Eyes: EOM are normal. Pupils are equal, round, and reactive to light.  Neck: Neck supple.  Cardiovascular: Regular rhythm and normal heart sounds.   Sinus bradycardia with heart rate in 50s  Pulmonary/Chest: Effort normal and breath sounds normal.  Abdominal: Soft. Bowel sounds are normal. He exhibits no distension. There is no tenderness.  Musculoskeletal: Normal range of motion.  Neurological: He is alert and oriented to person, place, and time.  Skin: Skin is warm and dry. Capillary refill takes less than 2 seconds. He is not diaphoretic.  Nursing note and vitals reviewed.  ED Treatments / Results  Labs (all labs ordered are listed, but only abnormal results are displayed) Labs Reviewed  BASIC METABOLIC PANEL - Abnormal; Notable for the following:       Result Value   CO2 19 (*)    Glucose, Bld 122 (*)    BUN 30 (*)    Calcium 8.7 (*)    GFR calc non Af Amer 55 (*)    All other components within normal limits  CBC - Abnormal; Notable for the following:    RBC 4.01 (*)    Hemoglobin 11.0 (*)    HCT 34.1 (*)    RDW 17.4 (*)    Platelets 129 (*)    All other components within normal limits  TSH - Abnormal; Notable for the following:    TSH 4.900 (*)    All other components within normal limits  T4  TROPONIN I  TROPONIN I  TROPONIN I  I-STAT TROPOININ, ED   EKG  EKG Interpretation  Date/Time:  Thursday April 13 2016 20:25:16 EST Ventricular Rate:  72 PR Interval:    QRS Duration: 151 QT Interval:  463 QTC Calculation: 507 R Axis:   130 Text Interpretation:  Sinus rhythm Prolonged PR interval Right bundle branch block Probable anteroseptal infarct, old Baseline wander in lead(s) I II aVR No significant change since last tracing Confirmed by FLOYD MD, DANIEL [id number]) on 04/13/2016 8:56:34 PM      Radiology Dg  Chest Portable 1 View  Result Date: 04/13/2016 CLINICAL DATA:  Chest pain.  Sinus bradycardia. EXAM: PORTABLE CHEST 1 VIEW COMPARISON:  02/27/2016 FINDINGS: Shallow inspiration with elevation of the right hemidiaphragm. Probable small bilateral pleural effusions. Atelectasis in the lung bases. Mild cardiac enlargement with mild pulmonary vascular congestion. No edema. No pneumothorax. Calcified and tortuous aorta. Degenerative changes in the spine and shoulders peer IMPRESSION: Small bilateral pleural effusions with basilar atelectasis. Cardiac enlargement with mild pulmonary vascular congestion. No edema. Electronically Signed   By: Lucienne Capers M.D.   On: 04/13/2016 21:05   Procedures Procedures (including critical care time)  Medications  Ordered in ED Medications  furosemide (LASIX) tablet 40 mg (not administered)  potassium chloride (K-DUR) CR tablet 10 mEq (not administered)  escitalopram (LEXAPRO) tablet 10 mg (not administered)  QUEtiapine (SEROQUEL) tablet 200 mg (not administered)  levothyroxine (SYNTHROID, LEVOTHROID) tablet 75 mcg (not administered)  aspirin EC tablet 81 mg (not administered)  morphine 2 MG/ML injection 2 mg (not administered)  gi cocktail (Maalox,Lidocaine,Donnatal) (not administered)  acetaminophen (TYLENOL) tablet 650 mg (not administered)  ondansetron (ZOFRAN) injection 4 mg (not administered)  enoxaparin (LOVENOX) injection 30 mg (not administered)   Initial Impression / Assessment and Plan / ED Course  I have reviewed the triage vital signs and the nursing notes.  83 y.o. male with above stated PMHx, HPI, and physical. EMS called to patient's house for chest pain. Upon arrival his chest pain had resolved. Patient was found to be bradycardic in route to 41 and hypotensive with systolic blood pressure and mid 80s. This was not an relation to any medications administered or IV placement. Patient was given atropine and IV fluids with improvement in blood  pressure and heart rate.  EKG showing biphasic T waves anteriorly. Troponin not elevated. Patient already took aspirin. Patient currently chest pain-free and also borderline hypotensive in field. Nitroglycerin not given. Chest x-ray showing mall bilateral effusions with pulmonary edema.  Laboratory and imaging results were personally reviewed by myself and used in the medical decision making of this patient's treatment and disposition.  Pt admitted to medicine for further evaluation and management of ACS evaluation and telemetry for bradycardia. Pt understands and agrees with the plan and has no further questions or concerns.   Pt care discussed with and followed by my attending, Dr. Deno Etienne  Mayer Camel, MD Pager [phone]  Final Clinical Impressions(s) / ED Diagnoses   Final diagnoses:  Precordial chest pain  Bradycardia  Hypotension  Acute pulmonary edema ([place])  Bilateral pleural effusion   New Prescriptions Current Discharge Medication List       Mayer Camel, MD 04/13/16 [place], DO 04/14/16 0005

## 2016-04-13 NOTE — Progress Notes (Signed)
Patient received to 2W 26 from ED. Tele monitoring and skin assessment completed. Pt oriented to room and bed alarm on. Call light within reach

## 2016-04-13 NOTE — ED Triage Notes (Signed)
Pt arrives via EMS from home with c/o chest pain. Had SL nitro and ASA at home and pain resolved. Sinus brady rate of 41 en route. Pt asymptomatic. Received 0.5 MG atropine and 1L NS given by EMS

## 2016-04-14 ENCOUNTER — Other Ambulatory Visit: Payer: Self-pay | Admitting: Internal Medicine

## 2016-04-14 ENCOUNTER — Other Ambulatory Visit: Payer: Self-pay | Admitting: Cardiology

## 2016-04-14 DIAGNOSIS — I3131 Malignant pericardial effusion in diseases classified elsewhere: Secondary | ICD-10-CM

## 2016-04-14 DIAGNOSIS — H9190 Unspecified hearing loss, unspecified ear: Secondary | ICD-10-CM

## 2016-04-14 DIAGNOSIS — F0390 Unspecified dementia without behavioral disturbance: Secondary | ICD-10-CM

## 2016-04-14 DIAGNOSIS — I519 Heart disease, unspecified: Secondary | ICD-10-CM

## 2016-04-14 DIAGNOSIS — I313 Pericardial effusion (noninflammatory): Secondary | ICD-10-CM

## 2016-04-14 DIAGNOSIS — N183 Chronic kidney disease, stage 3 (moderate): Secondary | ICD-10-CM | POA: Diagnosis not present

## 2016-04-14 DIAGNOSIS — R079 Chest pain, unspecified: Secondary | ICD-10-CM | POA: Diagnosis not present

## 2016-04-14 DIAGNOSIS — I129 Hypertensive chronic kidney disease with stage 1 through stage 4 chronic kidney disease, or unspecified chronic kidney disease: Secondary | ICD-10-CM | POA: Diagnosis not present

## 2016-04-14 DIAGNOSIS — I4510 Unspecified right bundle-branch block: Secondary | ICD-10-CM

## 2016-04-14 DIAGNOSIS — I3139 Other pericardial effusion (noninflammatory): Secondary | ICD-10-CM

## 2016-04-14 DIAGNOSIS — C801 Malignant (primary) neoplasm, unspecified: Secondary | ICD-10-CM

## 2016-04-14 DIAGNOSIS — E039 Hypothyroidism, unspecified: Secondary | ICD-10-CM | POA: Diagnosis not present

## 2016-04-14 LAB — TROPONIN I
TROPONIN I: 0.06 ng/mL — AB (ref <0.03)
TROPONIN I: 0.06 ng/mL — AB (ref <0.03)
Troponin I: 0.05 ng/mL (ref <0.03)

## 2016-04-14 LAB — BRAIN NATRIURETIC PEPTIDE: B Natriuretic Peptide: 582.8 pg/mL — ABNORMAL HIGH (ref 0.0–100.0)

## 2016-04-14 LAB — GLUCOSE, CAPILLARY: Glucose-Capillary: 88 mg/dL (ref 65–99)

## 2016-04-14 NOTE — Discharge Instructions (Signed)
Follow with MCKEOWN,WILLIAM DAVID, MD in 1-2 months  Please get a complete blood count and chemistry panel checked by your Primary MD at your next visit, and again as instructed by your Primary MD. Please get your medications reviewed and adjusted by your Primary MD.  Please request your Primary MD to go over all Hospital Tests and Procedure/Radiological results at the follow up, please get all Hospital records sent to your Prim MD by signing hospital release before you go home.  If you had Pneumonia of Lung problems at the Hospital: Please get a 2 view Chest X ray done in 6-8 weeks after hospital discharge or sooner if instructed by your Primary MD.  If you have Congestive Heart Failure: Please call your Cardiologist or Primary MD anytime you have any of the following symptoms:  1) 3 pound weight gain in 24 hours or 5 pounds in 1 week  2) shortness of breath, with or without a dry hacking cough  3) swelling in the hands, feet or stomach  4) if you have to sleep on extra pillows at night in order to breathe  Follow cardiac low salt diet and 1.5 lit/day fluid restriction.  If you have diabetes Accuchecks 4 times/day, Once in AM empty stomach and then before each meal. Log in all results and show them to your primary doctor at your next visit. If any glucose reading is under 80 or above 300 call your primary MD immediately.  If you have Seizure/Convulsions/Epilepsy: Please do not drive, operate heavy machinery, participate in activities at heights or participate in high speed sports until you have seen by Primary MD or a Neurologist and advised to do so again.  If you had Gastrointestinal Bleeding: Please ask your Primary MD to check a complete blood count within one week of discharge or at your next visit. Your endoscopic/colonoscopic biopsies that are pending at the time of discharge, will also need to followed by your Primary MD.  Get Medicines reviewed and adjusted. Please take all  your medications with you for your next visit with your Primary MD  Please request your Primary MD to go over all hospital tests and procedure/radiological results at the follow up, please ask your Primary MD to get all Hospital records sent to his/her office.  If you experience worsening of your admission symptoms, develop shortness of breath, life threatening emergency, suicidal or homicidal thoughts you must seek medical attention immediately by calling 911 or calling your MD immediately  if symptoms less severe.  You must read complete instructions/literature along with all the possible adverse reactions/side effects for all the Medicines you take and that have been prescribed to you. Take any new Medicines after you have completely understood and accpet all the possible adverse reactions/side effects.   Do not drive or operate heavy machinery when taking Pain medications.   Do not take more than prescribed Pain, Sleep and Anxiety Medications  Special Instructions: If you have smoked or chewed Tobacco  in the last 2 yrs please stop smoking, stop any regular Alcohol  and or any Recreational drug use.  Wear Seat belts while driving.  Please note You were cared for by a hospitalist during your hospital stay. If you have any questions about your discharge medications or the care you received while you were in the hospital after you are discharged, you can call the unit and asked to speak with the hospitalist on call if the hospitalist that took care of you is not available. Once  you are discharged, your primary care physician will handle any further medical issues. Please note that NO REFILLS for any discharge medications will be authorized once you are discharged, as it is imperative that you return to your primary care physician (or establish a relationship with a primary care physician if you do not have one) for your aftercare needs so that they can reassess your need for medications and monitor  your lab values.  You can reach the hospitalist office at phone [phone] or fax [phone]   If you do not have a primary care physician, you can call [phone] for a physician referral.  Activity: As tolerated with Full fall precautions use walker/cane & assistance as needed  Diet: heart healthy  Disposition Home

## 2016-04-14 NOTE — Progress Notes (Signed)
Order received to discharge patient.  Telemetry monitor removed and CCMD notified.  Discharge instructions, follow up, medications and instructions for their use were discussed with patient's daughter.  PIV access removed.

## 2016-04-14 NOTE — Consult Note (Signed)
Reason for Consult:   Chest pain, near syncope, weakness  Requesting Physician: [facility] Primary Cardiologist Dr Gwenlyn Found  HPI:   83 y/o male seen once in 2014 for sinus bradycardia and RBBB. I could find no history of CAD or prior cath in [facility] but the pt's daughter says he had a cath in the past (5 yrs ago) and has a "blockage in the right artery". He was admitted from his PCP's office in Dec 2017 with an "abnormal EKG" and CHF found on a wellness visit. Echo showed an EF of 40%. His BNP on that admission was 2000. He was diuresed. It's a little difficult to gauge symptoms as the pt has mild dementia and never complains.   I saw him in the office as a post hospital follow up 03/29/16 and he was doing pretty well. He was able to walk to the mailbox everyday, about 1/2 mile. Labs done by his PCP 03/08/16 show stable stage 3 CRI. He is not on an ARB/ACE secondary to CRI and not on a beta blocker secondary to baseline bradycardia.   He is admitted now with ? near syncope and possibly chest pain. History is obtained from records as pt is essentially deaf and hearing aid can't be located. His Troponin is mildly elevated 0.06 and flat trend. When he was seen in the office 03/29/16 a Myoview was considered but the pt appeared to be pretty frail and has significant renal insufficieny. He was doing well on his current medications then.  PMHx:  Past Medical History:  Diagnosis Date  . Bladder cancer ([place])   . Depression   . HTN (hypertension)    off medications  . Hypothyroid   . Other testicular hypofunction   . Prediabetes   . Prostate cancer ([place])   . Stroke ([place]) 1980  . Vitamin D deficiency     Past Surgical History:  Procedure Laterality Date  . BIOPSY PROSTATE  2/99   pos for adenocarcinoma of prostate  . CYSTOURETHROSCOPY  7/99   and TUR of bladder cancer  . PROSTATE SURGERY  2007    SOCHx:  reports that he quit smoking about 52 years ago. His smoking use included  Cigarettes. He has a 10.00 pack-year smoking history. He has never used smokeless tobacco. He reports that he does not drink alcohol or use drugs.  FAMHx: Family History  Problem Relation Age of Onset  . Heart disease Mother   . Other Father     Brain tumor  . Thyroid disease Father     ALLERGIES: Allergies  Allergen Reactions  . Penicillins Hives, Itching, Swelling and Rash    Blisters also Has patient had a PCN reaction causing immediate rash, facial/tongue/throat swelling, SOB or lightheadedness with hypotension: Yes Has patient had a PCN reaction causing severe rash involving mucus membranes or skin necrosis: Unk Has patient had a PCN reaction that required hospitalization: No (was in hospital) Has patient had a PCN reaction occurring within the last 10 years: No If all of the above answers are "NO", then may proceed with Cephalosporin use.     ROS: Review of Systems: Unobtainable secondary to pt being deaf.  HOME MEDICATIONS: Prior to Admission medications   Medication Sig Start Date End Date Taking? Authorizing Provider  aspirin EC 81 MG tablet Take 81 mg by mouth daily.   Yes Historical Provider, MD  cholecalciferol (VITAMIN D) 1000 UNITS tablet Take 2,000 Units by mouth every morning.  Yes Historical Provider, MD  escitalopram (LEXAPRO) 10 MG tablet Take 10 mg by mouth every morning.    Yes Historical Provider, MD  furosemide (LASIX) 40 MG tablet Take 1 tablet (40 mg total) by mouth daily. 03/27/16  Yes Courtney Forcucci, PA-C  levothyroxine (SYNTHROID, LEVOTHROID) 75 MCG tablet Take 1 tablet (75 mcg total) by mouth daily. 05/26/15  Yes Courtney Forcucci, PA-C  Omega-3 Fatty Acids (FISH OIL PO) Take 1 capsule by mouth daily.   Yes Historical Provider, MD  potassium chloride (K-DUR) 10 MEQ tablet Take 1 tablet (10 mEq total) by mouth daily. 03/27/16  Yes Courtney Forcucci, PA-C  QUEtiapine (SEROQUEL) 200 MG tablet Take 200 mg by mouth at bedtime.   Yes Historical Provider,  MD    HOSPITAL MEDICATIONS: I have reviewed the patient's current medications.  VITALS: Blood pressure 113/67, pulse (!) 58, temperature 97.5 F (36.4 C), temperature source Oral, resp. rate 18, weight 148 lb 6.4 oz (67.3 kg), SpO2 97 %.  PHYSICAL EXAM: General appearance: alert, cooperative, no distress and HOH Neck: no carotid bruit and no JVD Lungs: scattered rhonchi, mainly Lt base Heart: regular rate and rhythm and short 2/6 systolic murmur AOV and LSB Abdomen: soft, non-tender; bowel sounds normal; no masses,  no organomegaly Extremities: extremities normal, atraumatic, no cyanosis or edema Pulses: 2+ and symmetric Skin: Skin color, texture, turgor normal. No rashes or lesions Neurologic: Grossly normal  LABS: Results for orders placed or performed during the hospital encounter of 04/13/16 (from the past 24 hour(s))  Basic metabolic panel     Status: Abnormal   Collection Time: 04/13/16  8:36 PM  Result Value Ref Range   Sodium 135 135 - 145 mmol/L   Potassium 3.9 3.5 - 5.1 mmol/L   Chloride 105 101 - 111 mmol/L   CO2 19 (L) 22 - 32 mmol/L   Glucose, Bld 122 (H) 65 - 99 mg/dL   BUN 30 (H) 6 - 20 mg/dL   Creatinine, Ser 1.16 0.61 - 1.24 mg/dL   Calcium 8.7 (L) 8.9 - 10.3 mg/dL   GFR calc non Af Amer 55 (L) >60 mL/min   GFR calc Af Amer >60 >60 mL/min   Anion gap 11 5 - 15  CBC     Status: Abnormal   Collection Time: 04/13/16  8:36 PM  Result Value Ref Range   WBC 6.8 4.0 - 10.5 K/uL   RBC 4.01 (L) 4.22 - 5.81 MIL/uL   Hemoglobin 11.0 (L) 13.0 - 17.0 g/dL   HCT 34.1 (L) 39.0 - 52.0 %   MCV 85.0 78.0 - 100.0 fL   MCH 27.4 26.0 - 34.0 pg   MCHC 32.3 30.0 - 36.0 g/dL   RDW 17.4 (H) 11.5 - 15.5 %   Platelets 129 (L) 150 - 400 K/uL  TSH     Status: Abnormal   Collection Time: 04/13/16  8:36 PM  Result Value Ref Range   TSH 4.900 (H) 0.350 - 4.500 uIU/mL  I-stat troponin, ED     Status: None   Collection Time: 04/13/16  8:45 PM  Result Value Ref Range   Troponin  i, poc 0.06 0.00 - 0.08 ng/mL   Comment 3          Troponin I-serum (0, 3, 6 hours)     Status: Abnormal   Collection Time: 04/14/16 12:05 AM  Result Value Ref Range   Troponin I 0.06 (HH) <0.03 ng/mL  Troponin I-serum (0, 3, 6 hours)  Status: Abnormal   Collection Time: 04/14/16  5:08 AM  Result Value Ref Range   Troponin I 0.06 (HH) <0.03 ng/mL  Glucose, capillary     Status: None   Collection Time: 04/14/16  6:53 AM  Result Value Ref Range   Glucose-Capillary 88 65 - 99 mg/dL    EKG: NSR, SB-57, RBBB  IMAGING: Dg Chest Portable 1 View  Result Date: 04/13/2016 CLINICAL DATA:  Chest pain.  Sinus bradycardia. EXAM: PORTABLE CHEST 1 VIEW COMPARISON:  02/27/2016 FINDINGS: Shallow inspiration with elevation of the right hemidiaphragm. Probable small bilateral pleural effusions. Atelectasis in the lung bases. Mild cardiac enlargement with mild pulmonary vascular congestion. No edema. No pneumothorax. Calcified and tortuous aorta. Degenerative changes in the spine and shoulders peer IMPRESSION: Small bilateral pleural effusions with basilar atelectasis. Cardiac enlargement with mild pulmonary vascular congestion. No edema. Electronically Signed   By: Lucienne Capers M.D.   On: 04/13/2016 21:05    IMPRESSION: Principal Problem:   Chest pain Active Problems:   Hypothyroid   CKD (chronic kidney disease) stage 3, GFR 30-59 ml/min   Dementia without behavioral disturbance   Thrombocytopenia (HCC)   Anemia   HOH (hard of hearing)   RECOMMENDATION: Check BNP, it was 2000 on his last adm. I wonder if this episode was secondary bradycardia. Consider event monitor as an OP. Doubt ischemic work up warranted. MD to see. I have asked RN to call me when family arrives.  I spoke with pt's daughter. She would like to take her father home today if at all possible. Will discuss with Dr Claiborne Billings.  Time Spent Directly with Patient: 40 minutes  Kerin Ransom, [place] beeper 04/14/2016, 10:48  AM   Patient seen and examined. Agree with assessment and plan. [name] is an 83 year old Caucasian male who is hard of hearing and has a history of chronic right bundle branch block.  According to his daughter, the patient had undergone a cardiac catheterization in the 1980s and was told of having normal coronary arteries.  The blockage that was referred to in the right is right bundle branch block as opposed to right coronary artery obstruction.  In December 2017 he developed CHF, BNP 2000.  An echo Doppler study showed LVH and suggested the possibility of inferior hypokinesis, EF of 40%, with grade 3 diastolic dysfunction,  aortic valve sclerosis with trivial AR, mitral annular calcification with mild-to-moderate MR,  left atrial dilatation, mild TR, and moderate pulmonary hypertension.  Of note, he also had a circumferential pericardial effusion.  He was aggressively diuresed  .  He was seen by Rober Minion  in follow-up in the office 2 weeks ago and was felt to be fairly well compensated.  He denies any anginal symptomatology.  He has been able to walk to the mailbox without chest pain.  He was admitted yesterday with low blood pressure 90/50 range without orthostatic change.  His ECG yesterday revealed normal sinus rhythm at 71 bpm with right bundle branch block and associated repolarization changes.  His ECG today shows sinus bradycardia with first-degree AV block and right bundle branch block.  Presently, he is asymptomatic.  Troponins or minimally increased at 0.06 and remained flat and most likely are due to his diastolic dysfunction and not representative of acute coronary syndrome.  He is mildly anemic.  Creatinine is stable at 1.16, although based on his GFR 55  is considered stage III chronic kidney insufficiency.  At this point, I do not feel  that the patient needs further inpatient diagnostic evaluation.  However, I would recommend a follow-up echo Doppler study to reassess resolution  of his prior pericardial effusion.  If he develops further bradycardia, an outpatient monitor may be helpful.                                                  Troy Sine, MD, [facility] 04/14/2016 12:40 PM

## 2016-04-14 NOTE — Discharge Summary (Signed)
Physician Discharge Summary  OLEE [name] [id number] DOB: [birth year] DOA: 04/13/2016  PCP: Alesia Richards, MD  Admit date: 04/13/2016 Discharge date: 04/14/2016  Admitted From: Home  Disposition:  Home  Recommendations for Outpatient Follow-up:  1. Follow up with Cardiology in 2-3 weeks   Home Health: none Equipment/Devices: none  Discharge Condition: stable CODE STATUS: DNR Diet recommendation: heart healthy  HPI: Per Dr. Lorin [name], [name] [name] is a 83 y.o. male with medical history significant of remote CVA, prostate/bladder CA, hypothyroidism, and HTN now controlled off medications presenting with possible chest pain.  The patient has dementia and so the history is uncertain.  His daughter reports that he took his antidepressant.  About an hour later he said he was going to bed.  Usually walks down hall, gets ready, and takes his hearing aids out.  Tonight, about 5 minutes after he went to his room he started calling his daughter.  She went to check on him and he was sitting in the dark.  He told her he couldn't get up and needed help in getting to bed.  He had not gotten dressed.  He said he hurt all over.  Complained of chest pain but also all over pain.  911 told her to give him 4 ASA.  BP was 90/50 both sitting and standing.  He has dementia and it is not clear whether his history is accurate, unsure if he really had chest pain. He did fall this AM while the attendant was there.  He did not have his glasses on in the bathroom.  Possibly trying to sit down on commode and missed.  No injury.  He was even able to walk to the mailbox and back today without difficulty after the fall. He was hospitalized from 12/22-27 with DOE, pleural effusion.  Echo with EF 40%, severe hypokinesis in the inferior myocardium.  He followed up with cardiology on 03/29/16.  There was discussion of myoview study to evaluate for ischemic cardiomyopathy but it was felt that the patient's overall condition  was such that this test was not indicated at this time.  Hospital Course: Discharge Diagnoses:  Principal Problem:   Chest pain Active Problems:   Hypothyroid   CKD (chronic kidney disease) stage 3, GFR 30-59 ml/min   Dementia without behavioral disturbance   Thrombocytopenia (HCC)   Anemia   HOH (hard of hearing)   Chest pain - Patient with possible substernal or left-sided chest pain; additional history cannot be obtained due to patient's dementia. Cardiology was consulted and evaluated patient, troponins have remained flat and EKG was non ischemic. He was cleared for d/c with outpatient follow up in cardiology clinic.  Dementia - No known behavioral disturbance, stable Anemia - Normocytic, possibly associated with CKD Thrombocytopenia - Platelets 129, no bleeding, follow as outpatient Hypothyroidism - TSH 4.9, 1.173 on 12/23. Suggest repeat TSH and free T4 in 4-6 weeks CKD - Creatinine 1.16, stable   Discharge Instructions   Allergies as of 04/14/2016      Reactions   Penicillins Hives, Itching, Swelling, Rash   Blisters also Has patient had a PCN reaction causing immediate rash, facial/tongue/throat swelling, SOB or lightheadedness with hypotension: Yes Has patient had a PCN reaction causing severe rash involving mucus membranes or skin necrosis: Unk Has patient had a PCN reaction that required hospitalization: No (was in hospital) Has patient had a PCN reaction occurring within the last 10 years: No If all of the above answers are "NO", then may  proceed with Cephalosporin use.      Medication List    TAKE these medications   aspirin EC 81 MG tablet Take 81 mg by mouth daily.   cholecalciferol 1000 units tablet Commonly known as:  VITAMIN D Take 2,000 Units by mouth every morning.   escitalopram 10 MG tablet Commonly known as:  LEXAPRO Take 10 mg by mouth every morning.   FISH OIL PO Take 1 capsule by mouth daily.   furosemide 40 MG tablet Commonly known as:   LASIX Take 1 tablet (40 mg total) by mouth daily.   levothyroxine 75 MCG tablet Commonly known as:  SYNTHROID, LEVOTHROID Take 1 tablet (75 mcg total) by mouth daily.   potassium chloride 10 MEQ tablet Commonly known as:  K-DUR Take 1 tablet (10 mEq total) by mouth daily.   QUEtiapine 200 MG tablet Commonly known as:  SEROQUEL Take 200 mg by mouth at bedtime.      Follow-up Information    Quay Burow, MD Follow up on 06/28/2016.   Specialties:  Cardiology, Radiology Why:  11:40. Office will conatct you with echo report Contact information: [address] [place] [address] [phone]          Allergies  Allergen Reactions  . Penicillins Hives, Itching, Swelling and Rash    Blisters also Has patient had a PCN reaction causing immediate rash, facial/tongue/throat swelling, SOB or lightheadedness with hypotension: Yes Has patient had a PCN reaction causing severe rash involving mucus membranes or skin necrosis: Unk Has patient had a PCN reaction that required hospitalization: No (was in hospital) Has patient had a PCN reaction occurring within the last 10 years: No If all of the above answers are "NO", then may proceed with Cephalosporin use.     Consultations:  Cardiology   Procedures/Studies:  Dg Chest Portable 1 View  Result Date: 04/13/2016 CLINICAL DATA:  Chest pain.  Sinus bradycardia. EXAM: PORTABLE CHEST 1 VIEW COMPARISON:  02/27/2016 FINDINGS: Shallow inspiration with elevation of the right hemidiaphragm. Probable small bilateral pleural effusions. Atelectasis in the lung bases. Mild cardiac enlargement with mild pulmonary vascular congestion. No edema. No pneumothorax. Calcified and tortuous aorta. Degenerative changes in the spine and shoulders peer IMPRESSION: Small bilateral pleural effusions with basilar atelectasis. Cardiac enlargement with mild pulmonary vascular congestion. No edema. Electronically Signed   By: Lucienne Capers M.D.    On: 04/13/2016 21:05      Subjective: - no chest pain, shortness of breath, no abdominal pain, nausea or vomiting. Doesn't know why he is here  Discharge Exam: Vitals:   04/13/16 2345 04/14/16 1324  BP: 113/67 113/60  Pulse: (!) 58 (!) 59  Resp: 18 18  Temp: 97.5 F (36.4 C) 98.1 F (36.7 C)   Vitals:   04/13/16 2115 04/13/16 2230 04/13/16 2345 04/14/16 1324  BP: 101/62 110/61 113/67 113/60  Pulse: (!) 59 (!) 55 (!) 58 (!) 59  Resp: 12 20 18 18   Temp:   97.5 F (36.4 C) 98.1 F (36.7 C)  TempSrc:   Oral Oral  SpO2: 98% 93% 97% 99%  Weight:   67.3 kg (148 lb 6.4 oz)     General: Pt is alert, awake, not in acute distress Cardiovascular: RRR, S1/S2 +, no rubs, no gallops Respiratory: CTA bilaterally, no wheezing, no rhonchi Abdominal: Soft, NT, ND, bowel sounds + Extremities: no edema, no cyanosis    The results of significant diagnostics from this hospitalization (including imaging, microbiology, ancillary and laboratory) are listed  below for reference.     Microbiology: No results found for this or any previous visit (from the past 240 hour(s)).   Labs: BNP (last 3 results)  Recent Labs  02/25/16 1701 02/26/16 0313  BNP 1,153.6* 0000000*   Basic Metabolic Panel:  Recent Labs Lab 04/13/16 2036  NA 135  K 3.9  CL 105  CO2 19*  GLUCOSE 122*  BUN 30*  CREATININE 1.16  CALCIUM 8.7*   Liver Function Tests: No results for input(s): AST, ALT, ALKPHOS, BILITOT, PROT, ALBUMIN in the last 168 hours. No results for input(s): LIPASE, AMYLASE in the last 168 hours. No results for input(s): AMMONIA in the last 168 hours. CBC:  Recent Labs Lab 04/13/16 2036  WBC 6.8  HGB 11.0*  HCT 34.1*  MCV 85.0  PLT 129*   Cardiac Enzymes:  Recent Labs Lab 04/14/16 0005 04/14/16 0508 04/14/16 1059  TROPONINI 0.06* 0.06* 0.05*   BNP: Invalid input(s): POCBNP CBG:  Recent Labs Lab 04/14/16 0653  GLUCAP 88   D-Dimer No results for input(s): DDIMER  in the last 72 hours. Hgb A1c No results for input(s): HGBA1C in the last 72 hours. Lipid Profile No results for input(s): CHOL, HDL, LDLCALC, TRIG, CHOLHDL, LDLDIRECT in the last 72 hours. Thyroid function studies  Recent Labs  04/13/16 2036  TSH 4.900*   Anemia work up No results for input(s): VITAMINB12, FOLATE, FERRITIN, TIBC, IRON, RETICCTPCT in the last 72 hours. Urinalysis    Component Value Date/Time   COLORURINE DARK YELLOW 02/22/2015 1639   APPEARANCEUR CLEAR 02/22/2015 1639   LABSPEC 1.023 02/22/2015 1639   PHURINE 5.5 02/22/2015 1639   GLUCOSEU NEGATIVE 02/22/2015 1639   HGBUR NEGATIVE 02/22/2015 1639   BILIRUBINUR NEGATIVE 02/22/2015 1639   KETONESUR TRACE (A) 02/22/2015 1639   PROTEINUR NEGATIVE 02/22/2015 1639   NITRITE NEGATIVE 02/22/2015 1639   LEUKOCYTESUR NEGATIVE 02/22/2015 1639   Sepsis Labs Invalid input(s): PROCALCITONIN,  WBC,  LACTICIDVEN Microbiology No results found for this or any previous visit (from the past 240 hour(s)).   Time coordinating discharge: 25 minutes  SIGNED:  Marzetta Board, MD  [facility] 04/14/2016, 2:43 PM Pager [phone]  If 7PM-7AM, please contact night-coverage [email or web address] Password [email or web address]

## 2016-04-18 ENCOUNTER — Encounter: Payer: Self-pay | Admitting: Internal Medicine

## 2016-04-18 LAB — T4: T4 TOTAL: 3.7 ug/dL — AB (ref 4.5–12.0)

## 2016-04-18 NOTE — Patient Instructions (Signed)

## 2016-04-18 NOTE — Progress Notes (Signed)
[facility]   Unk Pinto, M.D.    Uvaldo Bristle. Silverio Lay, P.A.-C      Starlyn Skeans, P.A.-C  [facility]                [address] [place], [place] [id number] Telephone [phone] Telefax [phone]  Comprehensive Evaluation & Examination & [place]     This very nice 83 y.o. [name] [name] presents for a Screening/Preventative Visit & comprehensive evaluation and management of multiple medical co-morbidities.  Patient has been followed for HTN, Prediabetes, Hyperlipidemia and Vitamin D Deficiency. Patient has hx/o SDAT and has been declining mentally and recently his daughter [name] [name] has taken over his affairs and moved him in with her.       Patient was just hospitalized overnight (2/9-12/2016) for a 24 hour admission for evaluation of Chest Pain. Cardiac enzymes were negative & he was released. Patient also had a recent hospitalization in Dec 22-27 for dyspnea which was felt due to fluid overload from combined systolic and diastolic CHF and he was diuresed and discharged home. Aggressive cardiac w/u was deferred due to his Dementia. Patient was contacted by clinical staff to assure stability and schedule follow-up. Since home, patient has been stable & doing well according to his caretaker daughter. All hospital discharge medications were reconciled with his daughter.      HTN predates circa 1982 and he also has CKD3 (GFR 55 ml/min). Patient's BP has been controlled at home.  Today's BP is at goal - 128/66. Since hospital discharge, patient denies any current cardiac symptoms as chest pain, palpitations, shortness of breath, dizziness or ankle swelling.     Patient's hyperlipidemia is controlled with diet and medications. Patient denies myalgias or other medication SE's. Last lipids were at goal albeit sl elevated Trig's: Lab Results  Component Value Date   CHOL 178 02/22/2015   HDL 47  02/22/2015   LDLCALC 97 02/22/2015   TRIG 171 (H) 02/22/2015   CHOLHDL 3.8 02/22/2015      Patient has prediabetes (A1c 5.8% in 2011) and patient denies reactive hypoglycemic symptoms, visual blurring, diabetic polys or paresthesias. Last A1c was at goal: Lab Results  Component Value Date   HGBA1C 5.6 02/26/2016       Finally, patient has history of Vitamin D Deficiency ("25" in 2008)  and last vitamin D was not at goal (70-100): Lab Results  Component Value Date   VD25OH 48 01/12/2016   Current Outpatient Prescriptions on File Prior to Visit  Medication Sig  . aspirin EC 81 MG  Take 81 mg by mouth daily.  Marland Kitchen VITAMIN D 1000 UNITS  Take 2,000 Units by mouth every morning.   . escitalopram 10 MG  Take 10 mg by mouth every morning.   . furosemide  40 MG  Take 1 tablet (40 mg total) by mouth daily.  Marland Kitchen levothyroxine  75 MCG  Take 1 tablet (75 mcg total) by mouth daily.  . Omega-3 FISH OIL  Take 1 capsule by mouth daily.  Marland Kitchen K-DUR 10 MEQ  Take 1 tablet (10 mEq total) by mouth daily.  . SEROQUEL 200 MG  Take 200 mg by mouth at bedtime.   Allergies  Allergen Reactions  . Penicillins Hives, Itching, Swelling and Rash   Past Medical History:  Diagnosis Date  . Bladder cancer ([place])   . Depression   .  HTN (hypertension)    off medications  . Hypothyroid   . Other testicular hypofunction   . Prediabetes   . Prostate cancer ([place])   . Stroke ([place]) 1980  . Vitamin D deficiency    Health Maintenance  Topic Date Due  . ZOSTAVAX  08/03/1989  . TETANUS/TDAP  03/06/2014  . INFLUENZA VACCINE  Completed  . PNA vac Low Risk Adult  Completed   Immunization History  Administered Date(s) Administered  . Influenza Split 12/18/2012  . Influenza, High Dose Seasonal PF 02/22/2015  . Influenza,inj,quad, With Preservative 01/12/2016  . Pneumococcal Conjugate-13 02/22/2015  . Pneumococcal Polysaccharide-23 03/06/2006  . Td 03/06/2004   Past Surgical History:  Procedure Laterality Date  .  BIOPSY PROSTATE  2/99   pos for adenocarcinoma of prostate  . CYSTOURETHROSCOPY  7/99   and TUR of bladder cancer  . PROSTATE SURGERY  2007   Family History  Problem Relation Age of Onset  . Heart disease Mother   . Other Father     Brain tumor  . Thyroid disease Father    Social History   Social History  . Marital status: Married    Spouse name: N/A  . Number of children: N/A  . Years of education: N/A   Occupational History  . retired    Social History Main Topics  . Smoking status: Former Smoker    Packs/day: 1.00    Years: 10.00    Types: Cigarettes    Quit date: 12/26/1963  . Smokeless tobacco: Never Used  . Alcohol use No  . Drug use: No  . Sexual activity: No    ROS Constitutional: Denies fever, chills, weight loss/gain, headaches, insomnia,  night sweats or change in appetite. Does c/o fatigue. Eyes: Denies redness, blurred vision, diplopia, discharge, itchy or watery eyes.  ENT: Denies discharge, congestion, post nasal drip, epistaxis, sore throat, earache, hearing loss, dental pain, Tinnitus, Vertigo, Sinus pain or snoring.  Cardio: Denies chest pain, palpitations, irregular heartbeat, syncope, dyspnea, diaphoresis, orthopnea, PND, claudication or edema Respiratory: denies cough, dyspnea, DOE, pleurisy, hoarseness, laryngitis or wheezing.  Gastrointestinal: Denies dysphagia, heartburn, reflux, water brash, pain, cramps, nausea, vomiting, bloating, diarrhea, constipation, hematemesis, melena, hematochezia, jaundice or hemorrhoids Genitourinary: Denies dysuria, frequency, urgency, nocturia, hesitancy, discharge, hematuria or flank pain Musculoskeletal: Denies arthralgia, myalgia, stiffness, Jt. Swelling, pain, limp or strain/sprain. Denies Falls. Skin: Denies puritis, rash, hives, warts, acne, eczema or change in skin lesion Neuro: No weakness, tremor, incoordination, spasms, paresthesia or pain Psychiatric: Denies confusion, memory loss or sensory loss. Denies  Depression. Endocrine: Denies change in weight, skin, hair change, nocturia, and paresthesia, diabetic polys, visual blurring or hyper / hypo glycemic episodes.  Heme/Lymph: No excessive bleeding, bruising or enlarged lymph nodes.  Physical Exam  BP 128/66   Pulse 64   Temp 97.9 F (36.6 C)   Resp 16   Ht 5' 6.5" (1.689 m)   Wt 149 lb 6.4 oz (67.8 kg)   BMI 23.75 kg/m   General Appearance: thin wasting elderly white man in no apparent distress.  Eyes: PERRLA, EOMs, conjunctiva no swelling or erythema, normal fundi and vessels. Sinuses: No frontal/maxillary tenderness ENT/Mouth: EACs patent / TMs  nl. Nares clear without erythema, swelling, mucoid exudates. Oral hygiene is good. No erythema, swelling, or exudate. Tongue normal, non-obstructing. Tonsils not swollen or erythematous. Hearing normal.  Neck: Supple, thyroid normal. No bruits, nodes or JVD. Respiratory: Respiratory effort normal.  BS equal and clear bilateral without rales, rhonci, wheezing or stridor.  Cardio: Heart sounds are normal with regular rate and rhythm and no murmurs, rubs or gallops. Peripheral pulses are normal and equal bilaterally without edema. No aortic or femoral bruits. Chest: symmetric with normal excursions and percussion.  Abdomen: Soft, with Nl bowel sounds. Nontender, no guarding, rebound, hernias, masses, or organomegaly.  Lymphatics: Non tender without lymphadenopathy.  Genitourinary: Deferred to Dr Gaynelle Arabian. Musculoskeletal:  Generalized decrease in muscle power, tone & bulk.  Normal gait using a cane for balance. Skin: Warm and dry without rashes, lesions, cyanosis, clubbing w/ few scattered ecchymosis.  Neuro: Cranial nerves intact, reflexes equal bilaterally. Normal muscle tone, no cerebellar symptoms. Sensation intact. (+) glabellar and palmomental reflexes.  Pysch: Alert and oriented X 3 with normal affect, insight and judgment appropriate.   Assessment and Plan  1. Combined systolic and  diastolic congestive heart failure, unspecified congestive heart failure chronicity ([place])  2. Essential hypertension  - Microalbumin / creatinine urine ratio - EKG 12-Lead - [place], RETROPERITNL ABD,  LTD - Urinalysis, Routine w reflex microscopic - CBC with Differential/Platelet - BASIC METABOLIC PANEL WITH GFR - TSH  3. Mixed hyperlipidemia  - EKG 12-Lead - [place], RETROPERITNL ABD,  LTD - Hepatic function panel - Lipid panel - TSH  4. Prediabetes  - EKG 12-Lead - [place], RETROPERITNL ABD,  LTD - Insulin, random  5. Vitamin D deficiency  - VITAMIN D 25 Hydroxy   6. Hypothyroidism  - TSH  7. SDAT (senile dementia of Alzheimer's type)   8. Screening for rectal cancer  - POC Hemoccult Bld/Stl   9. Prostate cancer screening  - PSA  10. History of prostate cancer  - PSA  11. Screening for ischemic heart disease  - EKG 12-Lead  12. Screening for AAA (aortic abdominal aneurysm)  - [place], RETROPERITNL ABD,  LTD  13. Medication management  - Urinalysis, Routine w reflex microscopic - CBC with Differential/Platelet - BASIC METABOLIC PANEL WITH GFR - Hepatic function panel - Magnesium - Lipid panel - TSH - Insulin, random - VITAMIN D 25 Hydroxy   14. Atherosclerosis of aorta (HCC)  - [place], RETROPERITNL ABD,  LTD       Continue prudent diet as discussed, weight control, BP monitoring, regular exercise, and medications as discussed.  Discussed med effects and SE's. Routine screening labs and tests as requested with regular follow-up as recommended. Over 40 minutes of exam, counseling, chart review and high complex critical decision making was performed

## 2016-04-19 ENCOUNTER — Other Ambulatory Visit: Payer: Self-pay | Admitting: Internal Medicine

## 2016-04-19 ENCOUNTER — Ambulatory Visit (INDEPENDENT_AMBULATORY_CARE_PROVIDER_SITE_OTHER): Payer: Medicare Other | Admitting: Internal Medicine

## 2016-04-19 VITALS — BP 128/66 | HR 64 | Temp 97.9°F | Resp 16 | Ht 66.5 in | Wt 149.4 lb

## 2016-04-19 DIAGNOSIS — E782 Mixed hyperlipidemia: Secondary | ICD-10-CM | POA: Diagnosis not present

## 2016-04-19 DIAGNOSIS — I5040 Unspecified combined systolic (congestive) and diastolic (congestive) heart failure: Secondary | ICD-10-CM | POA: Diagnosis not present

## 2016-04-19 DIAGNOSIS — Z136 Encounter for screening for cardiovascular disorders: Secondary | ICD-10-CM

## 2016-04-19 DIAGNOSIS — E039 Hypothyroidism, unspecified: Secondary | ICD-10-CM | POA: Diagnosis not present

## 2016-04-19 DIAGNOSIS — Z79899 Other long term (current) drug therapy: Secondary | ICD-10-CM

## 2016-04-19 DIAGNOSIS — F0280 Dementia in other diseases classified elsewhere without behavioral disturbance: Secondary | ICD-10-CM

## 2016-04-19 DIAGNOSIS — I700 Atherosclerosis of aorta: Secondary | ICD-10-CM

## 2016-04-19 DIAGNOSIS — E559 Vitamin D deficiency, unspecified: Secondary | ICD-10-CM

## 2016-04-19 DIAGNOSIS — Z125 Encounter for screening for malignant neoplasm of prostate: Secondary | ICD-10-CM | POA: Diagnosis not present

## 2016-04-19 DIAGNOSIS — G301 Alzheimer's disease with late onset: Secondary | ICD-10-CM

## 2016-04-19 DIAGNOSIS — I10 Essential (primary) hypertension: Secondary | ICD-10-CM | POA: Diagnosis not present

## 2016-04-19 DIAGNOSIS — Z1212 Encounter for screening for malignant neoplasm of rectum: Secondary | ICD-10-CM

## 2016-04-19 DIAGNOSIS — Z8546 Personal history of malignant neoplasm of prostate: Secondary | ICD-10-CM

## 2016-04-19 DIAGNOSIS — R7303 Prediabetes: Secondary | ICD-10-CM | POA: Diagnosis not present

## 2016-04-19 LAB — LIPID PANEL
CHOL/HDL RATIO: 3.6 ratio (ref <5.0)
CHOLESTEROL: 169 mg/dL (ref <200)
HDL: 47 mg/dL (ref >40)
LDL Cholesterol: 101 mg/dL — ABNORMAL HIGH (ref <100)
Triglycerides: 107 mg/dL (ref <150)
VLDL: 21 mg/dL (ref <30)

## 2016-04-19 LAB — BASIC METABOLIC PANEL WITH GFR
BUN: 24 mg/dL (ref 7–25)
CALCIUM: 9.2 mg/dL (ref 8.6–10.3)
CHLORIDE: 103 mmol/L (ref 98–110)
CO2: 26 mmol/L (ref 20–31)
CREATININE: 1.31 mg/dL — AB (ref 0.70–1.11)
GFR, EST AFRICAN AMERICAN: 57 mL/min — AB (ref >=60)
GFR, Est Non African American: 49 mL/min — ABNORMAL LOW (ref >=60)
Glucose, Bld: 102 mg/dL — ABNORMAL HIGH (ref 65–99)
Potassium: 3.8 mmol/L (ref 3.5–5.3)
SODIUM: 140 mmol/L (ref 135–146)

## 2016-04-19 LAB — CBC WITH DIFFERENTIAL/PLATELET
BASOS ABS: 0 {cells}/uL (ref 0–200)
Basophils Relative: 0 %
Eosinophils Absolute: 230 cells/uL (ref 15–500)
Eosinophils Relative: 5 %
HCT: 39.3 % (ref 38.5–50.0)
Hemoglobin: 12.4 g/dL — ABNORMAL LOW (ref 13.2–17.1)
LYMPHS PCT: 22 %
Lymphs Abs: 1012 cells/uL (ref 850–3900)
MCH: 27.4 pg (ref 27.0–33.0)
MCHC: 31.6 g/dL — ABNORMAL LOW (ref 32.0–36.0)
MCV: 86.8 fL (ref 80.0–100.0)
MONOS PCT: 5 %
MPV: 11.3 fL (ref 7.5–12.5)
Monocytes Absolute: 230 cells/uL (ref 200–950)
NEUTROS ABS: 3128 {cells}/uL (ref 1500–7800)
Neutrophils Relative %: 68 %
PLATELETS: 132 10*3/uL — AB (ref 140–400)
RBC: 4.53 MIL/uL (ref 4.20–5.80)
RDW: 17.1 % — AB (ref 11.0–15.0)
WBC: 4.6 10*3/uL (ref 3.8–10.8)

## 2016-04-19 LAB — HEPATIC FUNCTION PANEL
ALT: 27 U/L (ref 9–46)
AST: 20 U/L (ref 10–35)
Albumin: 3.6 g/dL (ref 3.6–5.1)
Alkaline Phosphatase: 89 U/L (ref 40–115)
BILIRUBIN TOTAL: 0.8 mg/dL (ref 0.2–1.2)
Bilirubin, Direct: 0.2 mg/dL (ref <=0.2)
Indirect Bilirubin: 0.6 mg/dL (ref 0.2–1.2)
Total Protein: 6 g/dL — ABNORMAL LOW (ref 6.1–8.1)

## 2016-04-19 LAB — TSH: TSH: 2.67 mIU/L (ref 0.40–4.50)

## 2016-04-20 DIAGNOSIS — G309 Alzheimer's disease, unspecified: Secondary | ICD-10-CM | POA: Diagnosis not present

## 2016-04-20 DIAGNOSIS — I5042 Chronic combined systolic (congestive) and diastolic (congestive) heart failure: Secondary | ICD-10-CM | POA: Diagnosis not present

## 2016-04-20 DIAGNOSIS — J90 Pleural effusion, not elsewhere classified: Secondary | ICD-10-CM | POA: Diagnosis not present

## 2016-04-20 DIAGNOSIS — I130 Hypertensive heart and chronic kidney disease with heart failure and stage 1 through stage 4 chronic kidney disease, or unspecified chronic kidney disease: Secondary | ICD-10-CM | POA: Diagnosis not present

## 2016-04-20 DIAGNOSIS — I4510 Unspecified right bundle-branch block: Secondary | ICD-10-CM | POA: Diagnosis not present

## 2016-04-20 DIAGNOSIS — N183 Chronic kidney disease, stage 3 (moderate): Secondary | ICD-10-CM | POA: Diagnosis not present

## 2016-04-20 LAB — URINALYSIS, MICROSCOPIC ONLY
CRYSTALS: NONE SEEN [HPF]
Casts: NONE SEEN [LPF]
RBC / HPF: NONE SEEN RBC/HPF (ref <=2)
Squamous Epithelial / LPF: NONE SEEN [HPF] (ref <=5)
Yeast: NONE SEEN [HPF]

## 2016-04-20 LAB — INSULIN, RANDOM: Insulin: 17.7 u[IU]/mL (ref 2.0–19.6)

## 2016-04-20 LAB — URINALYSIS, ROUTINE W REFLEX MICROSCOPIC
Bilirubin Urine: NEGATIVE
Glucose, UA: NEGATIVE
Hgb urine dipstick: NEGATIVE
Ketones, ur: NEGATIVE
Nitrite: NEGATIVE
PROTEIN: NEGATIVE
Specific Gravity, Urine: 1.015 (ref 1.001–1.035)
pH: 6 (ref 5.0–8.0)

## 2016-04-20 LAB — PSA: PSA: 13.1 ng/mL — ABNORMAL HIGH (ref <=4.0)

## 2016-04-20 LAB — MICROALBUMIN / CREATININE URINE RATIO
Creatinine, Urine: 84 mg/dL (ref 20–370)
MICROALB/CREAT RATIO: 5 ug/mg{creat} (ref <30)
Microalb, Ur: 0.4 mg/dL

## 2016-04-20 LAB — MAGNESIUM: MAGNESIUM: 2.1 mg/dL (ref 1.5–2.5)

## 2016-04-20 LAB — VITAMIN D 25 HYDROXY (VIT D DEFICIENCY, FRACTURES): Vit D, 25-Hydroxy: 55 ng/mL (ref 30–100)

## 2016-04-21 NOTE — Progress Notes (Signed)
Pt's caregiver/wife was made aware of lab results & voiced understanding.

## 2016-04-22 LAB — URINE CULTURE

## 2016-04-23 ENCOUNTER — Other Ambulatory Visit: Payer: Self-pay | Admitting: Internal Medicine

## 2016-04-23 DIAGNOSIS — N410 Acute prostatitis: Secondary | ICD-10-CM

## 2016-04-23 MED ORDER — LEVOFLOXACIN 500 MG PO TABS: ORAL_TABLET | ORAL | 0 refills | Status: DC

## 2016-05-03 DIAGNOSIS — J90 Pleural effusion, not elsewhere classified: Secondary | ICD-10-CM | POA: Diagnosis not present

## 2016-05-03 DIAGNOSIS — I130 Hypertensive heart and chronic kidney disease with heart failure and stage 1 through stage 4 chronic kidney disease, or unspecified chronic kidney disease: Secondary | ICD-10-CM | POA: Diagnosis not present

## 2016-05-03 DIAGNOSIS — G309 Alzheimer's disease, unspecified: Secondary | ICD-10-CM | POA: Diagnosis not present

## 2016-05-03 DIAGNOSIS — I5042 Chronic combined systolic (congestive) and diastolic (congestive) heart failure: Secondary | ICD-10-CM | POA: Diagnosis not present

## 2016-05-03 DIAGNOSIS — N183 Chronic kidney disease, stage 3 (moderate): Secondary | ICD-10-CM | POA: Diagnosis not present

## 2016-05-03 DIAGNOSIS — I4510 Unspecified right bundle-branch block: Secondary | ICD-10-CM | POA: Diagnosis not present

## 2016-05-08 DIAGNOSIS — Z7982 Long term (current) use of aspirin: Secondary | ICD-10-CM | POA: Diagnosis not present

## 2016-05-08 DIAGNOSIS — E785 Hyperlipidemia, unspecified: Secondary | ICD-10-CM | POA: Diagnosis not present

## 2016-05-08 DIAGNOSIS — F329 Major depressive disorder, single episode, unspecified: Secondary | ICD-10-CM | POA: Diagnosis not present

## 2016-05-08 DIAGNOSIS — I130 Hypertensive heart and chronic kidney disease with heart failure and stage 1 through stage 4 chronic kidney disease, or unspecified chronic kidney disease: Secondary | ICD-10-CM | POA: Diagnosis not present

## 2016-05-08 DIAGNOSIS — N183 Chronic kidney disease, stage 3 (moderate): Secondary | ICD-10-CM | POA: Diagnosis not present

## 2016-05-08 DIAGNOSIS — G309 Alzheimer's disease, unspecified: Secondary | ICD-10-CM | POA: Diagnosis not present

## 2016-05-08 DIAGNOSIS — I5042 Chronic combined systolic (congestive) and diastolic (congestive) heart failure: Secondary | ICD-10-CM | POA: Diagnosis not present

## 2016-05-08 DIAGNOSIS — F0280 Dementia in other diseases classified elsewhere without behavioral disturbance: Secondary | ICD-10-CM | POA: Diagnosis not present

## 2016-05-08 DIAGNOSIS — I4510 Unspecified right bundle-branch block: Secondary | ICD-10-CM | POA: Diagnosis not present

## 2016-05-18 DIAGNOSIS — N183 Chronic kidney disease, stage 3 (moderate): Secondary | ICD-10-CM | POA: Diagnosis not present

## 2016-05-18 DIAGNOSIS — F0280 Dementia in other diseases classified elsewhere without behavioral disturbance: Secondary | ICD-10-CM | POA: Diagnosis not present

## 2016-05-18 DIAGNOSIS — I4510 Unspecified right bundle-branch block: Secondary | ICD-10-CM | POA: Diagnosis not present

## 2016-05-18 DIAGNOSIS — G309 Alzheimer's disease, unspecified: Secondary | ICD-10-CM | POA: Diagnosis not present

## 2016-05-18 DIAGNOSIS — I130 Hypertensive heart and chronic kidney disease with heart failure and stage 1 through stage 4 chronic kidney disease, or unspecified chronic kidney disease: Secondary | ICD-10-CM | POA: Diagnosis not present

## 2016-05-18 DIAGNOSIS — I5042 Chronic combined systolic (congestive) and diastolic (congestive) heart failure: Secondary | ICD-10-CM | POA: Diagnosis not present

## 2016-05-23 ENCOUNTER — Ambulatory Visit: Payer: Self-pay

## 2016-05-24 ENCOUNTER — Encounter: Payer: Self-pay | Admitting: Internal Medicine

## 2016-05-24 ENCOUNTER — Ambulatory Visit (INDEPENDENT_AMBULATORY_CARE_PROVIDER_SITE_OTHER): Payer: Medicare Other | Admitting: Internal Medicine

## 2016-05-24 VITALS — BP 144/76 | HR 72 | Temp 97.5°F | Resp 16 | Ht 66.5 in | Wt 155.8 lb

## 2016-05-24 DIAGNOSIS — N419 Inflammatory disease of prostate, unspecified: Secondary | ICD-10-CM | POA: Diagnosis not present

## 2016-05-24 NOTE — Patient Instructions (Signed)
Prostatitis Prostatitis is swelling of the prostate gland. The prostate helps to make semen. It is below a man's bladder, in front of the rectum. There are different types of prostatitis. Follow these instructions at home:  Take over-the-counter and prescription medicines only as told by your doctor.  If you were prescribed an antibiotic medicine, take it as told by your doctor. Do not stop taking the antibiotic even if you start to feel better.  If your doctor prescribed exercises, do them as directed.  Take sitz baths as told by your doctor. To take a sitz bath, sit in warm water that is deep enough to cover your hips and butt.  Keep all follow-up visits as told by your doctor. This is important. Contact a doctor if:  Your symptoms get worse.  You have a fever. Get help right away if:  You have chills.  You feel sick to your stomach (nauseous).  You throw up (vomit).  You feel light-headed.  You feel like you might pass out (faint).  You cannot pee (urinate).  You have blood or clumps of blood (blood clots) in your pee (urine). This information is not intended to replace advice given to you by your health care provider. Make sure you discuss any questions you have with your health care provider. Document Released: 08/22/2011 Document Revised: 11/11/2015 Document Reviewed: 11/11/2015 Elsevier Interactive Patient Education  2017 Elsevier Inc.  

## 2016-05-24 NOTE — Progress Notes (Signed)
  Subjective:    Patient ID: [name] [name], male    DOB: 06-12-1927, 83 y.o.   MRN: [id number]  HPI  Patient is a very nice 83 yo WWM with multiple medical co-morbidities including moderate SDAT who returns brought in by his daughter for f/u post tx w/Levaquin for an Enterococcus UTI. Patient reports stable N x 2-3 x and denis any LUTS otherwise as well as no dysuria or discharge.   Medication Sig  . aspirin EC 81 MG tablet Take 81 mg by mouth daily.  Marland Kitchen VIT D 1000 UNITS tablet Take 2,000 Units by mouth every morning.   . escitalopram10 MG tablet Take 10 mg by mouth every morning.   . furosemide  40 MG tablet Take 1 tablet (40 mg total) by mouth daily.  Marland Kitchen levothyroxine  75 MCG tablet Take 1 tablet (75 mcg total) by mouth daily.  . Omega-3 Fatty Acids (FISH OIL PO) Take 1 capsule by mouth daily.  Marland Kitchen K-DUR 10 MEQ tablet Take 1 tablet (10 mEq total) by mouth daily.  . QUEtiapine  200 MG tablet Take 200 mg by mouth at bedtime.   Allergies  Allergen Reactions  . Penicillins Hives, Itching, Swelling and Rash   Past Medical History:  Diagnosis Date  . Bladder cancer ([name])   . Depression   . HTN (hypertension)    off medications  . Hypothyroid   . Other testicular hypofunction   . Prediabetes   . Prostate cancer ([name] [name])   . Stroke ([name] [name]) 1980  . Vitamin D deficiency    Past Surgical History:  Procedure Laterality Date  . BIOPSY PROSTATE  2/99   pos for adenocarcinoma of prostate  . CYSTOURETHROSCOPY  7/99   and TUR of bladder cancer  . PROSTATE SURGERY  2007   Review of Systems  10 point systems review negative except as above.    Objective:   Physical Exam   BP (!) 144/76   Pulse 72   Temp 97.5 F (36.4 C)   Resp 16   Ht 5' 6.5" (1.689 m)   Wt 155 lb 12.8 oz (70.7 kg)   BMI 24.77 kg/m   HEENT - WNL. Neck - supple. Nl Thyroid. Carotids 2+ & No bruits, JVD Chest - Clear equal BS. Cor - Nl HS. RRR w/o sig murmur. PP 1(+). No edema. Abd - Soft & benign. MS- FROM w/o  deformities. Gait Nl. Neuro - No obvious Cr N abnormalities.Nl w/o focal abnormalities. Poor st recall. Limited insight with concrete abstractions.    Assessment & Plan:   1. Prostatitis  - Urinalysis, Routine w reflex microscopic - Urine culture

## 2016-05-25 LAB — URINALYSIS, ROUTINE W REFLEX MICROSCOPIC
BILIRUBIN URINE: NEGATIVE
Glucose, UA: NEGATIVE
HGB URINE DIPSTICK: NEGATIVE
KETONES UR: NEGATIVE
Leukocytes, UA: NEGATIVE
Nitrite: NEGATIVE
PH: 6.5 (ref 5.0–8.0)
Protein, ur: NEGATIVE
Specific Gravity, Urine: 1.019 (ref 1.001–1.035)

## 2016-05-25 LAB — URINE CULTURE

## 2016-05-26 ENCOUNTER — Other Ambulatory Visit: Payer: Self-pay | Admitting: Internal Medicine

## 2016-05-27 ENCOUNTER — Other Ambulatory Visit: Payer: Self-pay | Admitting: Internal Medicine

## 2016-05-28 DIAGNOSIS — F0280 Dementia in other diseases classified elsewhere without behavioral disturbance: Secondary | ICD-10-CM | POA: Diagnosis not present

## 2016-05-28 DIAGNOSIS — I5042 Chronic combined systolic (congestive) and diastolic (congestive) heart failure: Secondary | ICD-10-CM | POA: Diagnosis not present

## 2016-05-28 DIAGNOSIS — I4510 Unspecified right bundle-branch block: Secondary | ICD-10-CM | POA: Diagnosis not present

## 2016-05-28 DIAGNOSIS — N183 Chronic kidney disease, stage 3 (moderate): Secondary | ICD-10-CM | POA: Diagnosis not present

## 2016-05-28 DIAGNOSIS — I130 Hypertensive heart and chronic kidney disease with heart failure and stage 1 through stage 4 chronic kidney disease, or unspecified chronic kidney disease: Secondary | ICD-10-CM | POA: Diagnosis not present

## 2016-05-28 DIAGNOSIS — G309 Alzheimer's disease, unspecified: Secondary | ICD-10-CM | POA: Diagnosis not present

## 2016-05-30 DIAGNOSIS — I130 Hypertensive heart and chronic kidney disease with heart failure and stage 1 through stage 4 chronic kidney disease, or unspecified chronic kidney disease: Secondary | ICD-10-CM | POA: Diagnosis not present

## 2016-05-30 DIAGNOSIS — I4510 Unspecified right bundle-branch block: Secondary | ICD-10-CM | POA: Diagnosis not present

## 2016-05-30 DIAGNOSIS — G309 Alzheimer's disease, unspecified: Secondary | ICD-10-CM | POA: Diagnosis not present

## 2016-05-30 DIAGNOSIS — N183 Chronic kidney disease, stage 3 (moderate): Secondary | ICD-10-CM | POA: Diagnosis not present

## 2016-05-30 DIAGNOSIS — I5042 Chronic combined systolic (congestive) and diastolic (congestive) heart failure: Secondary | ICD-10-CM | POA: Diagnosis not present

## 2016-05-30 DIAGNOSIS — F0280 Dementia in other diseases classified elsewhere without behavioral disturbance: Secondary | ICD-10-CM | POA: Diagnosis not present

## 2016-05-30 DIAGNOSIS — Z79899 Other long term (current) drug therapy: Secondary | ICD-10-CM | POA: Diagnosis not present

## 2016-05-31 ENCOUNTER — Other Ambulatory Visit: Payer: Self-pay | Admitting: *Deleted

## 2016-05-31 ENCOUNTER — Telehealth: Payer: Self-pay | Admitting: *Deleted

## 2016-05-31 MED ORDER — FUROSEMIDE 40 MG PO TABS: ORAL_TABLET | ORAL | 1 refills | Status: DC

## 2016-05-31 NOTE — Telephone Encounter (Signed)
Spoke the patient's daughter and the [facility] nurse regarding the patient's Lasix Dose and Potassium dose.  Per Dr Melford Aase ,the lab results looked good in regard to Potassium and kidney function.  The patient should continue the Lasix twice a day and continue the same dose of potassium. The daughter is aware and amessage was left to inform [place].

## 2016-06-08 ENCOUNTER — Other Ambulatory Visit: Payer: Self-pay | Admitting: *Deleted

## 2016-06-08 MED ORDER — FUROSEMIDE 40 MG PO TABS: ORAL_TABLET | ORAL | 1 refills | Status: DC

## 2016-06-15 ENCOUNTER — Telehealth: Payer: Self-pay | Admitting: Cardiovascular Disease

## 2016-06-15 DIAGNOSIS — N183 Chronic kidney disease, stage 3 (moderate): Secondary | ICD-10-CM | POA: Diagnosis not present

## 2016-06-15 DIAGNOSIS — I5042 Chronic combined systolic (congestive) and diastolic (congestive) heart failure: Secondary | ICD-10-CM | POA: Diagnosis not present

## 2016-06-15 DIAGNOSIS — G309 Alzheimer's disease, unspecified: Secondary | ICD-10-CM | POA: Diagnosis not present

## 2016-06-15 DIAGNOSIS — F0280 Dementia in other diseases classified elsewhere without behavioral disturbance: Secondary | ICD-10-CM | POA: Diagnosis not present

## 2016-06-15 DIAGNOSIS — I130 Hypertensive heart and chronic kidney disease with heart failure and stage 1 through stage 4 chronic kidney disease, or unspecified chronic kidney disease: Secondary | ICD-10-CM | POA: Diagnosis not present

## 2016-06-15 DIAGNOSIS — I4510 Unspecified right bundle-branch block: Secondary | ICD-10-CM | POA: Diagnosis not present

## 2016-06-15 NOTE — Telephone Encounter (Signed)
Cj ( [place] ) is calling to see if there is an order for a PT INR . Please call .Marland Kitchen Thanks

## 2016-06-15 NOTE — Telephone Encounter (Signed)
Returned call to [facility] with [place].Stated she was calling to verify if patient needs a INR.Stated she has a order,but she does not know who ordered INR.After reviewing chart I do not see a order for INR.Message sent to [facility] for review.

## 2016-06-16 NOTE — Telephone Encounter (Signed)
I have not seen this patient since December to her hospitalization. I'm not sure who ordered the INR

## 2016-06-16 NOTE — Telephone Encounter (Signed)
Returned call to [facility] with [place] unable to leave a message her voice mail box full.

## 2016-06-20 NOTE — Telephone Encounter (Signed)
Returned call to [place] with [facility] Dr.Berry's advice given.

## 2016-06-28 ENCOUNTER — Encounter: Payer: Self-pay | Admitting: Cardiovascular Disease

## 2016-06-28 ENCOUNTER — Ambulatory Visit (INDEPENDENT_AMBULATORY_CARE_PROVIDER_SITE_OTHER): Payer: Medicare Other | Admitting: Cardiovascular Disease

## 2016-06-28 DIAGNOSIS — I10 Essential (primary) hypertension: Secondary | ICD-10-CM

## 2016-06-28 DIAGNOSIS — E7800 Pure hypercholesterolemia, unspecified: Secondary | ICD-10-CM

## 2016-06-28 DIAGNOSIS — I5042 Chronic combined systolic (congestive) and diastolic (congestive) heart failure: Secondary | ICD-10-CM | POA: Diagnosis not present

## 2016-06-28 DIAGNOSIS — I2510 Atherosclerotic heart disease of native coronary artery without angina pectoris: Secondary | ICD-10-CM | POA: Insufficient documentation

## 2016-06-28 NOTE — Assessment & Plan Note (Signed)
[name] [name] is here for follow-up of her mild systolic and diastolic heart failure. I initially saw him in consult 02/26/16. He had bilateral pleural effusions and a BNP of 2000. He was diuresed. He was recently seen by Kerin Ransom in the office and was doing well. He lives with his daughter who weighs him on a daily basis and is aware of salt restriction. He is on twice a day Lasix. A 2-D echo revealed an EF of 40% with inferior wall motion abnormality and evidence of restrictive physiology. There was a moderate circumferential pericardial effusion with an intermittent diastolic compression of the right atrium and no evidence of pericardial tamponade . I'm going to repeat a 2-D echocardiogram.

## 2016-06-28 NOTE — Patient Instructions (Signed)
Medication Instructions: Your physician recommends that you continue on your current medications as directed. Please refer to the Current Medication list given to you today.  Follow-Up: We request that you follow-up in: 3 months with Kerin Ransom, PA-C and in 6 months with Dr Andria Rhein will receive a reminder letter in the mail two months in advance. If you don't receive a letter, please call our office to schedule the follow-up appointment.  If you need a refill on your cardiac medications before your next appointment, please call your pharmacy.

## 2016-06-28 NOTE — Assessment & Plan Note (Signed)
History of hyperlipidemia not on statin therapy with recent lipid profile performed 04/19/16 revealing total cholesterol 169, LDL 101 and HDL 47.

## 2016-06-28 NOTE — Assessment & Plan Note (Signed)
History of CAD status post stenting of prostate 5 years ago although the there is no record of this. He does have an inferior wall motion and O'Malley. He denies chest pain.

## 2016-06-28 NOTE — Assessment & Plan Note (Signed)
History of hypertension blood pressure measured at 120/73. He is currently not on antihypertensive medications.

## 2016-06-28 NOTE — Progress Notes (Signed)
06/28/2016 [name] [name]   [birth year]  [id number]  Primary Physician [name],[name] DAVID, MD Primary Cardiologist: [name] Harp MD [name] [name]  HPI:  [name] [name] is an 83 year old mildly overweight widowed African-American male father of one daughter [name] [name] who lives alone. He has seen [name] [name] back in 2014. At that time he had a left bundle-branch block. Prior 2-D echocardiogram performed 01/09/13 showed normal LV function with grade 1 diastolic dysfunction. His other problems include hypertension and hyperlipidemia. He has never had a heart attack or stroke. He denies chest pain but does admit to some shortness of breath. He saw his PCP because of "not feeling well and weakness". An EKG showed right bundle branch block and he was admitted for further evaluation. His BNP is 2000. His chest x-ray shows bilateral pleural effusions right greater than left. He has no other signs of congestive heart failure. He was diuresed and discharged home 5 days later. He did see [name] [name] in the office 03/29/16 and was doing well. He denies chest pain or shortness of breath. His daughter weighs him a daily basis. He is aware of salt restriction.   Current Outpatient Prescriptions  Medication Sig Dispense Refill  . aspirin EC 81 MG tablet Take 81 mg by mouth daily.    . cholecalciferol (VITAMIN D) 1000 UNITS tablet Take 2,000 Units by mouth every morning.     . escitalopram (LEXAPRO) 10 MG tablet Take 10 mg by mouth every morning.     . furosemide (LASIX) 40 MG tablet Take 2 tablets daily for fluid and BP. 28 tablet 1  . levothyroxine (SYNTHROID, LEVOTHROID) 75 MCG tablet Take 1 tablet (75 mcg total) by mouth daily. 90 tablet 1  . Omega-3 Fatty Acids (FISH OIL PO) Take 1 capsule by mouth daily.    . potassium chloride (K-DUR) 10 MEQ tablet Take 1 tablet (10 mEq total) by mouth daily. 90 tablet 0  . QUEtiapine (SEROQUEL) 200 MG tablet Take 100 mg by mouth at bedtime.      No current  facility-administered medications for this visit.     Allergies  Allergen Reactions  . Penicillins Hives, Itching, Swelling and Rash         Social History   Social History  . Marital status: Married    Spouse name: N/A  . Number of children: N/A  . Years of education: N/A   Occupational History  . retired    Social History Main Topics  . Smoking status: Former Smoker    Packs/day: 1.00    Years: 10.00    Types: Cigarettes    Quit date: 12/26/1963  . Smokeless tobacco: Never Used  . Alcohol use No  . Drug use: No  . Sexual activity: Not on file   Other Topics Concern  . Not on file   Social History Narrative  . No narrative on file     Review of Systems: General: negative for chills, fever, night sweats or weight changes.  Cardiovascular: negative for chest pain, dyspnea on exertion, edema, orthopnea, palpitations, paroxysmal nocturnal dyspnea or shortness of breath Dermatological: negative for rash Respiratory: negative for cough or wheezing Urologic: negative for hematuria Abdominal: negative for nausea, vomiting, diarrhea, bright red blood per rectum, melena, or hematemesis Neurologic: negative for visual changes, syncope, or dizziness All other systems reviewed and are otherwise negative except as noted above.    Blood pressure 120/73, pulse (!) 58, height 5' 6.5" (1.689 m), weight 155 lb (  70.3 kg).  General appearance: alert and no distress Neck: no adenopathy, no carotid bruit, no JVD, supple, symmetrical, trachea midline and thyroid not enlarged, symmetric, no tenderness/mass/nodules Lungs: clear to auscultation bilaterally Heart: regular rate and rhythm, S1, S2 normal, no murmur, click, rub or gallop Extremities: extremities normal, atraumatic, no cyanosis or edema  EKG not performed today  ASSESSMENT AND PLAN:   HTN (hypertension) History of hypertension blood pressure measured at 120/73. He is currently not on antihypertensive  medications.  Hyperlipidemia History of hyperlipidemia not on statin therapy with recent lipid profile performed 04/19/16 revealing total cholesterol 169, LDL 101 and HDL 47.  Chronic combined systolic and diastolic congestive heart failure [facility]) [name] is here for follow-up of her mild systolic and diastolic heart failure. I initially saw him in consult 02/26/16. He had bilateral pleural effusions and a BNP of 2000. He was diuresed. He was recently seen by [name] [name] in the office and was doing well. He lives with his daughter who weighs him on a daily basis and is aware of salt restriction. He is on twice a day Lasix. A 2-D echo revealed an EF of 40% with inferior wall motion abnormality and evidence of restrictive physiology. There was a moderate circumferential pericardial effusion with an intermittent diastolic compression of the right atrium and no evidence of pericardial tamponade . I'm going to repeat a 2-D echocardiogram.  Coronary artery disease History of CAD status post stenting of prostate 5 years ago although the there is no record of this. He does have an inferior wall motion and O'Malley. He denies chest pain.      [name] Harp MD FACP,FACC,FAHA, [facility] 06/28/2016 12:21 PM

## 2016-07-01 DIAGNOSIS — F0280 Dementia in other diseases classified elsewhere without behavioral disturbance: Secondary | ICD-10-CM | POA: Diagnosis not present

## 2016-07-01 DIAGNOSIS — N183 Chronic kidney disease, stage 3 (moderate): Secondary | ICD-10-CM | POA: Diagnosis not present

## 2016-07-01 DIAGNOSIS — G309 Alzheimer's disease, unspecified: Secondary | ICD-10-CM | POA: Diagnosis not present

## 2016-07-01 DIAGNOSIS — I4510 Unspecified right bundle-branch block: Secondary | ICD-10-CM | POA: Diagnosis not present

## 2016-07-01 DIAGNOSIS — I5042 Chronic combined systolic (congestive) and diastolic (congestive) heart failure: Secondary | ICD-10-CM | POA: Diagnosis not present

## 2016-07-01 DIAGNOSIS — I130 Hypertensive heart and chronic kidney disease with heart failure and stage 1 through stage 4 chronic kidney disease, or unspecified chronic kidney disease: Secondary | ICD-10-CM | POA: Diagnosis not present

## 2016-07-05 DIAGNOSIS — F0280 Dementia in other diseases classified elsewhere without behavioral disturbance: Secondary | ICD-10-CM | POA: Diagnosis not present

## 2016-07-05 DIAGNOSIS — I4510 Unspecified right bundle-branch block: Secondary | ICD-10-CM | POA: Diagnosis not present

## 2016-07-05 DIAGNOSIS — N183 Chronic kidney disease, stage 3 (moderate): Secondary | ICD-10-CM | POA: Diagnosis not present

## 2016-07-05 DIAGNOSIS — I5042 Chronic combined systolic (congestive) and diastolic (congestive) heart failure: Secondary | ICD-10-CM | POA: Diagnosis not present

## 2016-07-05 DIAGNOSIS — I130 Hypertensive heart and chronic kidney disease with heart failure and stage 1 through stage 4 chronic kidney disease, or unspecified chronic kidney disease: Secondary | ICD-10-CM | POA: Diagnosis not present

## 2016-07-05 DIAGNOSIS — G309 Alzheimer's disease, unspecified: Secondary | ICD-10-CM | POA: Diagnosis not present

## 2016-07-07 DIAGNOSIS — N183 Chronic kidney disease, stage 3 (moderate): Secondary | ICD-10-CM | POA: Diagnosis not present

## 2016-07-07 DIAGNOSIS — Z7982 Long term (current) use of aspirin: Secondary | ICD-10-CM | POA: Diagnosis not present

## 2016-07-07 DIAGNOSIS — I130 Hypertensive heart and chronic kidney disease with heart failure and stage 1 through stage 4 chronic kidney disease, or unspecified chronic kidney disease: Secondary | ICD-10-CM | POA: Diagnosis not present

## 2016-07-07 DIAGNOSIS — G309 Alzheimer's disease, unspecified: Secondary | ICD-10-CM | POA: Diagnosis not present

## 2016-07-07 DIAGNOSIS — F329 Major depressive disorder, single episode, unspecified: Secondary | ICD-10-CM | POA: Diagnosis not present

## 2016-07-07 DIAGNOSIS — I5042 Chronic combined systolic (congestive) and diastolic (congestive) heart failure: Secondary | ICD-10-CM | POA: Diagnosis not present

## 2016-07-07 DIAGNOSIS — I4510 Unspecified right bundle-branch block: Secondary | ICD-10-CM | POA: Diagnosis not present

## 2016-07-07 DIAGNOSIS — F0280 Dementia in other diseases classified elsewhere without behavioral disturbance: Secondary | ICD-10-CM | POA: Diagnosis not present

## 2016-07-07 DIAGNOSIS — E785 Hyperlipidemia, unspecified: Secondary | ICD-10-CM | POA: Diagnosis not present

## 2016-07-11 DIAGNOSIS — F0280 Dementia in other diseases classified elsewhere without behavioral disturbance: Secondary | ICD-10-CM | POA: Diagnosis not present

## 2016-07-11 DIAGNOSIS — G309 Alzheimer's disease, unspecified: Secondary | ICD-10-CM | POA: Diagnosis not present

## 2016-07-11 DIAGNOSIS — I5042 Chronic combined systolic (congestive) and diastolic (congestive) heart failure: Secondary | ICD-10-CM | POA: Diagnosis not present

## 2016-07-11 DIAGNOSIS — I4510 Unspecified right bundle-branch block: Secondary | ICD-10-CM | POA: Diagnosis not present

## 2016-07-11 DIAGNOSIS — N183 Chronic kidney disease, stage 3 (moderate): Secondary | ICD-10-CM | POA: Diagnosis not present

## 2016-07-11 DIAGNOSIS — I130 Hypertensive heart and chronic kidney disease with heart failure and stage 1 through stage 4 chronic kidney disease, or unspecified chronic kidney disease: Secondary | ICD-10-CM | POA: Diagnosis not present

## 2016-07-19 DIAGNOSIS — I5042 Chronic combined systolic (congestive) and diastolic (congestive) heart failure: Secondary | ICD-10-CM | POA: Diagnosis not present

## 2016-07-19 DIAGNOSIS — G309 Alzheimer's disease, unspecified: Secondary | ICD-10-CM | POA: Diagnosis not present

## 2016-07-19 DIAGNOSIS — I4510 Unspecified right bundle-branch block: Secondary | ICD-10-CM | POA: Diagnosis not present

## 2016-07-19 DIAGNOSIS — I130 Hypertensive heart and chronic kidney disease with heart failure and stage 1 through stage 4 chronic kidney disease, or unspecified chronic kidney disease: Secondary | ICD-10-CM | POA: Diagnosis not present

## 2016-07-19 DIAGNOSIS — N183 Chronic kidney disease, stage 3 (moderate): Secondary | ICD-10-CM | POA: Diagnosis not present

## 2016-07-19 DIAGNOSIS — F0280 Dementia in other diseases classified elsewhere without behavioral disturbance: Secondary | ICD-10-CM | POA: Diagnosis not present

## 2016-07-20 ENCOUNTER — Telehealth: Payer: Self-pay | Admitting: *Deleted

## 2016-07-20 ENCOUNTER — Other Ambulatory Visit: Payer: Self-pay | Admitting: Internal Medicine

## 2016-07-20 MED ORDER — TRAZODONE HCL 150 MG PO TABS: ORAL_TABLET | ORAL | 0 refills | Status: DC

## 2016-07-20 NOTE — Telephone Encounter (Signed)
Daughter called and states the patient is up and down at night. The daughter increase the Seroquel dose to 200 mg at supper but the patient is still restless.  Per Dr Melford Aase, continue the same dose of Seroquel and add Trazodone 150 mg at bedtime.  Regarding the confusion, Dr Melford Aase advised if he gets so bad the daughter can not handle him, take him to [facility] ED for a psychiatric evaluation and possible admission to adjust medications.

## 2016-07-25 ENCOUNTER — Encounter: Payer: Self-pay | Admitting: Internal Medicine

## 2016-08-01 ENCOUNTER — Telehealth (HOSPITAL_COMMUNITY): Payer: Self-pay | Admitting: Cardiovascular Disease

## 2016-08-01 NOTE — Telephone Encounter (Signed)
I called pt to get him scheduled for echocardiogram that Dr.Berry wanted him to have and spoke with his daughter Worthy Flank and she voiced that that Dr. Gwenlyn Found told her that even once the patient had the test done , there was no way the outcome of the diagnosis. She opted out of having the echo done.

## 2016-08-04 ENCOUNTER — Ambulatory Visit: Payer: Self-pay | Admitting: Internal Medicine

## 2016-08-08 ENCOUNTER — Ambulatory Visit (INDEPENDENT_AMBULATORY_CARE_PROVIDER_SITE_OTHER): Payer: Medicare Other | Admitting: Physician Assistant

## 2016-08-08 ENCOUNTER — Encounter: Payer: Self-pay | Admitting: Physician Assistant

## 2016-08-08 VITALS — BP 130/68 | HR 70 | Temp 97.7°F | Resp 14 | Ht 66.5 in | Wt 158.0 lb

## 2016-08-08 DIAGNOSIS — D696 Thrombocytopenia, unspecified: Secondary | ICD-10-CM | POA: Diagnosis not present

## 2016-08-08 DIAGNOSIS — I509 Heart failure, unspecified: Secondary | ICD-10-CM | POA: Diagnosis not present

## 2016-08-08 DIAGNOSIS — F325 Major depressive disorder, single episode, in full remission: Secondary | ICD-10-CM

## 2016-08-08 DIAGNOSIS — I5042 Chronic combined systolic (congestive) and diastolic (congestive) heart failure: Secondary | ICD-10-CM

## 2016-08-08 DIAGNOSIS — E7800 Pure hypercholesterolemia, unspecified: Secondary | ICD-10-CM | POA: Diagnosis not present

## 2016-08-08 DIAGNOSIS — Z79899 Other long term (current) drug therapy: Secondary | ICD-10-CM | POA: Diagnosis not present

## 2016-08-08 DIAGNOSIS — I10 Essential (primary) hypertension: Secondary | ICD-10-CM

## 2016-08-08 DIAGNOSIS — E039 Hypothyroidism, unspecified: Secondary | ICD-10-CM

## 2016-08-08 DIAGNOSIS — F0390 Unspecified dementia without behavioral disturbance: Secondary | ICD-10-CM | POA: Diagnosis not present

## 2016-08-08 LAB — CBC WITH DIFFERENTIAL/PLATELET
BASOS ABS: 0 {cells}/uL (ref 0–200)
BASOS PCT: 0 %
EOS PCT: 5 %
Eosinophils Absolute: 290 cells/uL (ref 15–500)
HCT: 41.7 % (ref 38.5–50.0)
HEMOGLOBIN: 13.1 g/dL — AB (ref 13.2–17.1)
LYMPHS ABS: 1392 {cells}/uL (ref 850–3900)
Lymphocytes Relative: 24 %
MCH: 27.9 pg (ref 27.0–33.0)
MCHC: 31.4 g/dL — ABNORMAL LOW (ref 32.0–36.0)
MCV: 88.7 fL (ref 80.0–100.0)
MONOS PCT: 7 %
MPV: 11.1 fL (ref 7.5–12.5)
Monocytes Absolute: 406 cells/uL (ref 200–950)
NEUTROS ABS: 3712 {cells}/uL (ref 1500–7800)
Neutrophils Relative %: 64 %
PLATELETS: 163 10*3/uL (ref 140–400)
RBC: 4.7 MIL/uL (ref 4.20–5.80)
RDW: 15.7 % — ABNORMAL HIGH (ref 11.0–15.0)
WBC: 5.8 10*3/uL (ref 3.8–10.8)

## 2016-08-08 NOTE — Progress Notes (Signed)
3 month follow up   Essential hypertension - continue medications, DASH diet, exercise and monitor at home. Call if greater than 130/80.  -     CBC with Differential/Platelet -     BASIC METABOLIC PANEL WITH GFR -     Hepatic function panel  Chronic combined systolic and diastolic congestive heart failure ([place]) - If any increasing shortness of breath, swelling, or chest pressure go to ER immediately. Decrease your sodium intake to less than 2000 mg daily, decrease your fluid intake to less than 2 L daily, elevate legs, weigh yourself daily, call the office if 5 lbs weight loss OR gain in a day, and please remember to always increase your potassium intake with any increase of your fluid pill.  - follow up cardio -     Brain natriuretic peptide  Hypothyroidism, unspecified type Hypothyroidism-check TSH level, continue medications the same, reminded to take on an empty stomach 30-66mins before food.  -     TSH  Dementia without behavioral disturbance, unspecified dementia type Stable at this time, very supportive family  Thrombocytopenia ([place]) -     CBC with Differential/Platelet  Medication management -     Magnesium  Depression, major, in remission ([place]) - continue medications, stress management techniques discussed, increase water, good sleep hygiene discussed, increase exercise, and increase veggies.     Future Appointments Date Time Provider [place]  10/03/2016 1:30 PM Ilean China [facility]  11/10/2016 11:30 AM Unk Pinto, MD [facility] None     Subjective:  [name] [name] is a 83 y.o. male who presents for Follow up for HTN, CHF, chol.   He has history of prostate/bladder cancer followed by Dr. Gaynelle Arabian, PSA has been elevated, but just monitoring.   Follows up with Dr. Gwenlyn Found in July,  His blood pressure has been controlled at home, today their BP is BP: 130/68  He does not workout. He denies chest pain,  Dizziness.  Wt Readings  from Last 3 Encounters:  08/08/16 158 lb (71.7 kg)  06/28/16 155 lb (70.3 kg)  05/24/16 155 lb 12.8 oz (70.7 kg)    He is not on cholesterol medication and denies myalgias. His cholesterol is at goal. The cholesterol last visit was:   Lab Results  Component Value Date   CHOL 169 04/19/2016   HDL 47 04/19/2016   LDLCALC 101 (H) 04/19/2016   TRIG 107 04/19/2016   CHOLHDL 3.6 04/19/2016    He has been working on diet and exercise for prediabetes, and denies polydipsia and polyuria. Last A1C in the office was:  Lab Results  Component Value Date   HGBA1C 5.6 02/26/2016   Patient is on Vitamin D supplement.   Lab Results  Component Value Date   VD25OH 69 04/19/2016     He has dementia, is on lexapro for depression and seroquel for sleep at night, he does not drive and daughter helps with ADLs like fiances, etc.  He is on thyroid medication. His medication was not changed last visit.   Lab Results  Component Value Date   TSH 2.67 04/19/2016   BMI is Body mass index is 25.12 kg/m., he is working on diet and exercise. Wt Readings from Last 3 Encounters:  08/08/16 158 lb (71.7 kg)  06/28/16 155 lb (70.3 kg)  05/24/16 155 lb 12.8 oz (70.7 kg)     Medication Review: Current Outpatient Prescriptions on File Prior to Visit  Medication Sig Dispense Refill  . aspirin EC 81  MG tablet Take 81 mg by mouth daily.    . cholecalciferol (VITAMIN D) 1000 UNITS tablet Take 2,000 Units by mouth every morning.     . escitalopram (LEXAPRO) 10 MG tablet Take 10 mg by mouth every morning.     . furosemide (LASIX) 40 MG tablet Take 2 tablets daily for fluid and BP. 28 tablet 1  . levothyroxine (SYNTHROID, LEVOTHROID) 75 MCG tablet Take 1 tablet (75 mcg total) by mouth daily. 90 tablet 1  . Omega-3 Fatty Acids (FISH OIL PO) Take 1 capsule by mouth daily.    . potassium chloride (K-DUR) 10 MEQ tablet Take 1 tablet (10 mEq total) by mouth daily. 90 tablet 0  . QUEtiapine (SEROQUEL) 200 MG tablet Take  100 mg by mouth at bedtime.      No current facility-administered medications on file prior to visit.    Current Problems (verified) Patient Active Problem List   Diagnosis Date Noted  . Coronary artery disease 06/28/2016  . HOH (hard of hearing) 04/14/2016  . Chest pain 04/13/2016  . Thrombocytopenia ([place]) 04/13/2016  . Anemia 04/13/2016  . Dementia without behavioral disturbance 03/29/2016  . Chronic combined systolic and diastolic congestive heart failure ([place])   . History of prostate cancer 02/25/2016  . History of bladder cancer 02/25/2016  . Bilateral pleural effusion 02/25/2016  . Acute respiratory failure ([place]) 02/25/2016  . CKD (chronic kidney disease) stage 3, GFR 30-59 ml/min 02/25/2016  . Depression, major, in remission ([place]) 02/22/2015  . SDAT (senile dementia of Alzheimer's type) 10/07/2014  . Overactive bladder 03/10/2014  . Hyperlipidemia 04/02/2013  . Medication management 04/02/2013  . Hypothyroid   . Vitamin D deficiency   . Prediabetes   . Testosterone deficiency   . RBBB 12/25/2012  . HTN (hypertension) 12/25/2012     Allergies Allergies  Allergen Reactions  . Penicillins Hives, Itching, Swelling and Rash         Review of Systems  Constitutional: Negative for chills, fever and malaise/fatigue.  HENT: Negative for congestion, ear pain and sore throat.   Respiratory: Negative for cough, hemoptysis, sputum production, shortness of breath and wheezing.   Cardiovascular: Negative for chest pain, palpitations, orthopnea, claudication, leg swelling and PND.  Gastrointestinal: Negative for abdominal pain, blood in stool, constipation, diarrhea, heartburn and melena.  Genitourinary: Negative.   Skin: Negative.   Neurological: Negative for dizziness, sensory change, loss of consciousness and headaches.  Psychiatric/Behavioral: Positive for memory loss. Negative for depression. The patient is not nervous/anxious and does not have insomnia.       Objective:     Today's Vitals   08/08/16 1539  BP: 130/68  Pulse: 70  Resp: 14  Temp: 97.7 F (36.5 C)  SpO2: 99%  Weight: 158 lb (71.7 kg)  Height: 5' 6.5" (1.689 m)   Body mass index is 25.12 kg/m.  General appearance: alert, no distress, WD/WN, male HEENT: normocephalic, sclerae anicteric, TMs pearly, nares patent, no discharge or erythema, pharynx normal Oral cavity: MMM, no lesions Neck: supple, no lymphadenopathy, no thyromegaly, no masses Heart: RRR, normal S1, S2, no murmurs Lungs: Decreased breath sounds RLL, CTA bilaterally, no wheezes, rhonchi, or rales Abdomen: +bs, soft, non tender, non distended, no masses, no hepatomegaly, no splenomegaly Musculoskeletal: nontender, no swelling, no obvious deformity Extremities: 1+ edema, no cyanosis, no clubbing Pulses: 2+ symmetric, upper and lower extremities, normal cap refill Neurological: alert, oriented x 3, CN2-12 intact, strength normal upper extremities and lower extremities,  DTRs 2+ throughout, no cerebellar  signs, slow gait Psychiatric: normal affect, behavior normal, pleasant   Vicie Mutters, PA-C   08/08/2016

## 2016-08-08 NOTE — Patient Instructions (Signed)
Do the following things EVERYDAY: 1) Weigh yourself in the morning before breakfast or at the same time every day. Write it down and keep it in a log. 2) Take your medicines as prescribed 3) Eat low salt foods-Limit salt (sodium) to 2000 mg per day. Best thing to do is avoid processed foods.   4) Stay as active as you can everyday 5) Limit all fluids for the day to less than 1.5 liters  Call your doctor if:  Anytime you have any of the following symptoms:  1) 2 pound weight gain in 24 hours or 5 pounds in 1 week  2) shortness of breath, with or without a dry hacking cough  3) swelling in the hands, LEGs, feet or stomach  4) if you have to sleep on extra pillows at night in order to breathe. 5) after laying down at night for 20-30 mins, you wake up short of breath.   These can all be signs of fluid overload.

## 2016-08-09 LAB — HEPATIC FUNCTION PANEL
ALBUMIN: 3.8 g/dL (ref 3.6–5.1)
ALK PHOS: 85 U/L (ref 40–115)
ALT: 23 U/L (ref 9–46)
AST: 23 U/L (ref 10–35)
BILIRUBIN DIRECT: 0.1 mg/dL (ref <=0.2)
BILIRUBIN INDIRECT: 0.4 mg/dL (ref 0.2–1.2)
Total Bilirubin: 0.5 mg/dL (ref 0.2–1.2)
Total Protein: 6.4 g/dL (ref 6.1–8.1)

## 2016-08-09 LAB — BASIC METABOLIC PANEL WITH GFR
BUN: 36 mg/dL — ABNORMAL HIGH (ref 7–25)
CHLORIDE: 102 mmol/L (ref 98–110)
CO2: 27 mmol/L (ref 20–31)
CREATININE: 1.61 mg/dL — AB (ref 0.70–1.11)
Calcium: 9.4 mg/dL (ref 8.6–10.3)
GFR, EST NON AFRICAN AMERICAN: 38 mL/min — AB (ref >=60)
GFR, Est African American: 44 mL/min — ABNORMAL LOW (ref >=60)
Glucose, Bld: 86 mg/dL (ref 65–99)
Potassium: 4.3 mmol/L (ref 3.5–5.3)
SODIUM: 140 mmol/L (ref 135–146)

## 2016-08-09 LAB — BRAIN NATRIURETIC PEPTIDE: BRAIN NATRIURETIC PEPTIDE: 486.3 pg/mL — AB (ref <100)

## 2016-08-09 LAB — MAGNESIUM: Magnesium: 2.1 mg/dL (ref 1.5–2.5)

## 2016-08-09 LAB — TSH: TSH: 1.63 mIU/L (ref 0.40–4.50)

## 2016-08-11 NOTE — Progress Notes (Signed)
Pt's caregiver was made aware of lab results & voiced understanding of those results. Lab results were mailed out to the pt per caregiver's request.

## 2016-10-03 ENCOUNTER — Ambulatory Visit: Payer: Medicare Other | Admitting: Cardiology

## 2016-10-09 ENCOUNTER — Ambulatory Visit (INDEPENDENT_AMBULATORY_CARE_PROVIDER_SITE_OTHER): Payer: Medicare Other | Admitting: Cardiology

## 2016-10-09 ENCOUNTER — Encounter: Payer: Self-pay | Admitting: Cardiology

## 2016-10-09 DIAGNOSIS — I5042 Chronic combined systolic (congestive) and diastolic (congestive) heart failure: Secondary | ICD-10-CM

## 2016-10-09 DIAGNOSIS — I2510 Atherosclerotic heart disease of native coronary artery without angina pectoris: Secondary | ICD-10-CM | POA: Diagnosis not present

## 2016-10-09 DIAGNOSIS — F0150 Vascular dementia without behavioral disturbance: Secondary | ICD-10-CM | POA: Diagnosis not present

## 2016-10-09 DIAGNOSIS — N183 Chronic kidney disease, stage 3 unspecified: Secondary | ICD-10-CM

## 2016-10-09 DIAGNOSIS — I4510 Unspecified right bundle-branch block: Secondary | ICD-10-CM | POA: Diagnosis not present

## 2016-10-09 DIAGNOSIS — I313 Pericardial effusion (noninflammatory): Secondary | ICD-10-CM | POA: Diagnosis not present

## 2016-10-09 DIAGNOSIS — I3139 Other pericardial effusion (noninflammatory): Secondary | ICD-10-CM | POA: Insufficient documentation

## 2016-10-09 NOTE — Assessment & Plan Note (Signed)
Moderate in 2017-

## 2016-10-09 NOTE — Addendum Note (Signed)
Addended by: Kathyrn Lass on: 10/09/2016 11:39 AM   Modules accepted: Orders

## 2016-10-09 NOTE — Assessment & Plan Note (Signed)
With chronic bradycardia

## 2016-10-09 NOTE — Assessment & Plan Note (Signed)
History of CAD but no details

## 2016-10-09 NOTE — Patient Instructions (Signed)
Schedule Echocardiogram   Your physician wants you to follow-up with Dr.Berry in 6 months. You will receive a reminder letter in the mail two months in advance. If you don't receive a letter, please call our office to schedule the follow-up appointment.

## 2016-10-09 NOTE — Assessment & Plan Note (Signed)
Followed by his PCP 

## 2016-10-09 NOTE — Progress Notes (Addendum)
10/09/2016 [name] [name]   07-27-1927  [id number]  Primary Physician Unk Pinto, MD Primary Cardiologist: Dr Gwenlyn Found  HPI:  83 y/o male seen once in 2014 for sinus bradycardia and RBBB. The pt's daughter says he had a cath in the past (>5 yrs ago) and has a "blockage in the right artery", we have no record of that. He was admitted from his PCP's office in Dec 2017 with CHF. Echo then showed an EF of 40%. BNP on admission was 2000. He has been seen in f/i in Jan and April this year.  He is not on an ARB/ACE secondary to CRI and not on a beta blocker secondary to baseline bradycardia. He is in the office today with his daughter. He has been doing well. [place] and he did well, walking some, no unusual dyspnea. He does occasionally have a bump in his wgt of 3-5 lbs and his daughter appropriately increases his Lasix for a couple doses and it comes back down. The pt is pretty stoic and doesn't complain much.    Current Outpatient Prescriptions  Medication Sig Dispense Refill  . aspirin EC 81 MG tablet Take 81 mg by mouth daily.    . cholecalciferol (VITAMIN D) 1000 UNITS tablet Take 2,000 Units by mouth every morning.     . escitalopram (LEXAPRO) 10 MG tablet Take 10 mg by mouth every morning.     . furosemide (LASIX) 40 MG tablet Take 2 tablets daily for fluid and BP. 28 tablet 1  . levothyroxine (SYNTHROID, LEVOTHROID) 75 MCG tablet Take 1 tablet (75 mcg total) by mouth daily. 90 tablet 1  . Omega-3 Fatty Acids (FISH OIL PO) Take 1 capsule by mouth daily.    . potassium chloride (K-DUR) 10 MEQ tablet Take 1 tablet (10 mEq total) by mouth daily. 90 tablet 0  . QUEtiapine (SEROQUEL) 200 MG tablet Take 100 mg by mouth at bedtime.      No current facility-administered medications for this visit.     Allergies  Allergen Reactions  . Penicillins Hives, Itching, Swelling and Rash         Past Medical History:  Diagnosis Date  . Bladder cancer ([place])   .  Depression   . HTN (hypertension)    off medications  . Hypothyroid   . Other testicular hypofunction   . Prediabetes   . Prostate cancer ([place])   . Stroke ([place]) 1980  . Vitamin D deficiency     Social History   Social History  . Marital status: Married    Spouse name: N/A  . Number of children: N/A  . Years of education: N/A   Occupational History  . retired    Social History Main Topics  . Smoking status: Former Smoker    Packs/day: 1.00    Years: 10.00    Types: Cigarettes    Quit date: 12/26/1963  . Smokeless tobacco: Never Used  . Alcohol use No  . Drug use: No  . Sexual activity: Not on file   Other Topics Concern  . Not on file   Social History Narrative  . No narrative on file     Family History  Problem Relation Age of Onset  . Heart disease Mother   . Other Father        Brain tumor  . Thyroid disease Father      Review of Systems: General: negative for chills, fever, night sweats or weight changes.  HOH- wears a hearing aid Cardiovascular: negative for chest pain, dyspnea on exertion, edema, orthopnea, palpitations, paroxysmal nocturnal dyspnea or shortness of breath Dermatological: negative for rash Respiratory: negative for cough or wheezing Urologic: negative for hematuria H/O prostate and bladder CA Abdominal: negative for nausea, vomiting, diarrhea, bright red blood per rectum, melena, or hematemesis Neurologic: negative for visual changes, syncope, or dizziness All other systems reviewed and are otherwise negative except as noted above.    Blood pressure 134/71, pulse (!) 55, height 5\' 4"  (1.626 m), weight 160 lb 6.4 oz (72.8 kg).  General appearance: alert, cooperative, no distress and hearing aid Rt ear Neck: no JVD Lungs: clear to auscultation bilaterally Heart: regular rate and rhythm Extremities: extremities normal, atraumatic, no cyanosis or edema Skin: Skin color, texture, turgor normal. No rashes or lesions Neurologic:  Grossly normal  EKG NSR, SB-55, RBBB  ASSESSMENT AND PLAN:   Chronic combined systolic and diastolic congestive heart failure (HCC) EF 40% by echo Dec 2017 with a moderate circumfrential pericardial effusion  CKD (chronic kidney disease) stage 3, GFR 30-59 ml/min Followed by his PCP  Dementia without behavioral disturbance Mild dementia per family  Coronary artery disease History of CAD but no details  RBBB With chronic bradycardia  Pericardial effusion without cardiac tamponade Moderate in 2017-    PLAN  Dr Gwenlyn Found mentioned repeating an echo when he saw the pt in April but this was never done. I discussed this with the pt's daughter. I suggested we go ahead and get this done to f/u the pericardial effusion. I'll also request recent labs from his PCP.   Kerin Ransom PA-C 10/09/2016 11:36 AM   Lab s from PCP reviewed-08/08/16- SCr 1.6-BUN 36, K+ 4.3

## 2016-10-09 NOTE — Assessment & Plan Note (Signed)
EF 40% by echo Dec 2017 with a moderate circumfrential pericardial effusion

## 2016-10-09 NOTE — Assessment & Plan Note (Signed)
Mild dementia per family

## 2016-10-16 ENCOUNTER — Ambulatory Visit (HOSPITAL_COMMUNITY): Payer: Medicare Other | Attending: Cardiovascular Disease

## 2016-10-16 DIAGNOSIS — I4510 Unspecified right bundle-branch block: Secondary | ICD-10-CM | POA: Diagnosis not present

## 2016-10-16 DIAGNOSIS — I5030 Unspecified diastolic (congestive) heart failure: Secondary | ICD-10-CM | POA: Insufficient documentation

## 2016-10-16 DIAGNOSIS — I5042 Chronic combined systolic (congestive) and diastolic (congestive) heart failure: Secondary | ICD-10-CM | POA: Diagnosis not present

## 2016-10-16 DIAGNOSIS — I313 Pericardial effusion (noninflammatory): Secondary | ICD-10-CM

## 2016-10-16 DIAGNOSIS — I420 Dilated cardiomyopathy: Secondary | ICD-10-CM | POA: Insufficient documentation

## 2016-10-16 DIAGNOSIS — I2510 Atherosclerotic heart disease of native coronary artery without angina pectoris: Secondary | ICD-10-CM

## 2016-10-16 DIAGNOSIS — I088 Other rheumatic multiple valve diseases: Secondary | ICD-10-CM | POA: Diagnosis not present

## 2016-10-16 DIAGNOSIS — F0150 Vascular dementia without behavioral disturbance: Secondary | ICD-10-CM | POA: Diagnosis not present

## 2016-10-16 DIAGNOSIS — N183 Chronic kidney disease, stage 3 unspecified: Secondary | ICD-10-CM

## 2016-10-16 DIAGNOSIS — I3139 Other pericardial effusion (noninflammatory): Secondary | ICD-10-CM

## 2016-11-09 NOTE — Progress Notes (Signed)
This very nice 83 y.o. WWM presents for 6 month follow up with Hypertension, ASHD, Hyperlipidemia, Pre-Diabetes, Hypothyroidism  and Vitamin D Deficiency. He also has hx/o Prostate & Bladder Ca. Patient has moderate Vascular Dementia and is living with his caretaker daughter Dwaine Gale).      Patient is treated for HTN (1982)  & BP has been controlled at home.Patient also has CKD3 (GFR 55) consequent of his HTN.  Today's BP is at goal - 126/72.  Patient has ASHD with a hospitalization in Dec 2017 with combined Rt /Lt Ht failure responded well with diuresis and aggressive treatment deferred due to his moderate Dementia. Patient has had no complaints of any cardiac type chest pain, palpitations, dyspnea/orthopnea/PND, dizziness, claudication, or dependent edema.     Hyperlipidemia is controlled with diet & meds. Patient denies myalgias or other med SE's. Last Lipids were  Lab Results  Component Value Date   CHOL 169 04/19/2016   HDL 47 04/19/2016   LDLCALC 101 (H) 04/19/2016   TRIG 107 04/19/2016   CHOLHDL 3.6 04/19/2016      Also, the patient has history of PreDiabetes (A1c 5.8% / 2011) and he has had no symptoms of reactive hypoglycemia, diabetic polys, paresthesias or visual blurring.  Last A1c was at goal: Lab Results  Component Value Date   HGBA1C 5.6 02/26/2016      Patient was dx'd Hypothyroid in 2004 and has been on replacement since. Further, the patient also has history of Vitamin D Deficiency ("25" / 2008) and supplements vitamin D without any suspected side-effects. Last vitamin D was at goal:  Lab Results  Component Value Date   VD25OH 55 04/19/2016   Current Outpatient Prescriptions on File Prior to Visit  Medication Sig  . aspirin EC 81 MG Take 81 mg by mouth daily.  Marland Kitchen VITAMIN D 1000 UNITS Take 2,000 Units by mouth every morning.   . escitalopram  10 MG  Take 10 mg by mouth every morning.   . furosemide  40 MG  Take 2 tablets daily for fluid and BP.  Marland Kitchen  levothyroxine  75 MCG  Take 1 tablet (75 mcg total) by mouth daily.  . Omega-3 FISH OIL Take 1 capsule by mouth daily.  Marland Kitchen K-DUR 10 MEQ Take 1 tablet (10 mEq total) by mouth daily.  . QUEtiapine 200 MG Take 100 mg by mouth at bedtime.    Allergies  Allergen Reactions  . Penicillins Hives, Itching, Swelling and Rash   PMHx:   Past Medical History:  Diagnosis Date  . Bladder cancer ([place])   . Depression   . HTN (hypertension)    off medications  . Hypothyroid   . Other testicular hypofunction   . Prediabetes   . Prostate cancer ([place])   . Stroke ([place]) 1980  . Vitamin D deficiency    Immunization History  Administered Date(s) Administered  . Influenza Split 12/18/2012  . Influenza, High Dose Seasonal PF 02/22/2015  . Influenza,inj,quad, With Preservative 01/12/2016  . Pneumococcal Conjugate-13 02/22/2015  . Pneumococcal Polysaccharide-23 03/06/2006  . Td 03/06/2004   Past Surgical History:  Procedure Laterality Date  . BIOPSY PROSTATE  2/99   pos for adenocarcinoma of prostate  . CYSTOURETHROSCOPY  7/99   and TUR of bladder cancer  . PROSTATE SURGERY  2007   FHx:    Reviewed / unchanged  SHx:    Reviewed / unchanged  Systems Review:  Constitutional: Denies fever, chills, wt changes, headaches, insomnia, fatigue,  night sweats, change in appetite. Eyes: Denies redness, blurred vision, diplopia, discharge, itchy, watery eyes.  ENT: Denies discharge, congestion, post nasal drip, epistaxis, sore throat, earache, hearing loss, dental pain, tinnitus, vertigo, sinus pain, snoring.  CV: Denies chest pain, palpitations, irregular heartbeat, syncope, dyspnea, diaphoresis, orthopnea, PND, claudication or edema. Respiratory: denies cough, dyspnea, DOE, pleurisy, hoarseness, laryngitis, wheezing.  Gastrointestinal: Denies dysphagia, odynophagia, heartburn, reflux, water brash, abdominal pain or cramps, nausea, vomiting, bloating, diarrhea, constipation, hematemesis, melena, hematochezia   or hemorrhoids. Genitourinary: Denies dysuria, frequency, urgency, nocturia, hesitancy, discharge, hematuria or flank pain. Musculoskeletal: Denies arthralgias, myalgias, stiffness, jt. swelling, pain, limping or strain/sprain.  Skin: Denies pruritus, rash, hives, warts, acne, eczema or change in skin lesion(s). Neuro: No weakness, tremor, incoordination, spasms, paresthesia or pain. Psychiatric: Denies confusion, memory loss or sensory loss. Endo: Denies change in weight, skin or hair change.  Heme/Lymph: No excessive bleeding, bruising or enlarged lymph nodes.  Physical Exam  BP 126/72   Pulse 60   Temp (!) 97.2 F (36.2 C)   Resp 18   Ht 5' 6.5" (1.689 m)   Wt 161 lb 6.4 oz (73.2 kg)   BMI 25.66 kg/m   Appears well nourished, well groomed  and in no distress.  Eyes: PERRLA, EOMs, conjunctiva no swelling or erythema. Sinuses: No frontal/maxillary tenderness ENT/Mouth: EAC's clear, TM's nl w/o erythema, bulging. Nares clear w/o erythema, swelling, exudates. Oropharynx clear without erythema or exudates. Oral hygiene is good. Tongue normal, non obstructing. Hearing intact.  Neck: Supple. Thyroid nl. Car 2+/2+ without bruits, nodes or JVD. Chest: Respirations nl with BS clear & equal w/o rales, rhonchi, wheezing or stridor.  Cor: Heart sounds normal w/ regular rate and rhythm without sig. murmurs, gallops, clicks or rubs. Peripheral pulses normal and equal  without edema.  Abdomen: Soft & bowel sounds normal. Non-tender w/o guarding, rebound, hernias, masses or organomegaly.  Lymphatics: Unremarkable.  Musculoskeletal: Full ROM all peripheral extremities, joint stability, 5/5 strength and normal gait.  Skin: Warm, dry without exposed rashes, lesions or ecchymosis apparent.  Neuro: Cranial nerves intact, reflexes equal bilaterally. Sensory-motor testing grossly intact. Tendon reflexes grossly intact. (+) Snout & palmomental reflexes Pysch: Alert & oriented x 3.  Insight and judgement  nl & appropriate. No ideations.  Assessment and Plan:  1. Essential hypertension  - Continue medication, monitor blood pressure at home.  - Continue DASH diet. Reminder to go to the ER if any CP,  SOB, nausea, dizziness, severe HA, changes vision/speech.  - CBC with Differential/Platelet - BASIC METABOLIC PANEL WITH GFR - Magnesium - TSH  2. Hyperlipidemia, mixed  - Continue diet/meds, exercise,& lifestyle modifications.  - Continue monitor periodic cholesterol/liver & renal functions   - Hepatic function panel - Lipid panel - TSH  3. Prediabetes  - Hemoglobin A1c - Insulin, fasting  4. Vitamin D deficiency  - Continue diet, exercise, lifestyle modifications.  - Monitor appropriate labs. - Continue supplementation. - VITAMIN D 25 Hydroxy  5. Hypothyroidism  - TSH  6. Chronic combined systolic and diastolic congestive heart failure (HCC)  - Lipid panel  7. SDAT (senile dementia of Alzheimer's type)   8. Depression, major, in remission ([place])   9. Medication management  - CBC with Differential/Platelet - BASIC METABOLIC PANEL WITH GFR - Hepatic function panel - Magnesium - Lipid panel - TSH - Hemoglobin A1c - Insulin, fasting - VITAMIN D 25 Hydroxy       Discussed  regular exercise, BP monitoring, weight control to achieve/maintain BMI  less than 25 and discussed med and SE's. Recommended labs to assess and monitor clinical status with further disposition pending results of labs. Over 30 minutes of exam, counseling, chart review was performed.  

## 2016-11-09 NOTE — Patient Instructions (Signed)

## 2016-11-10 ENCOUNTER — Encounter: Payer: Self-pay | Admitting: Internal Medicine

## 2016-11-10 ENCOUNTER — Ambulatory Visit (INDEPENDENT_AMBULATORY_CARE_PROVIDER_SITE_OTHER): Payer: Medicare Other | Admitting: Internal Medicine

## 2016-11-10 VITALS — BP 126/72 | HR 60 | Temp 97.2°F | Resp 18 | Ht 66.5 in | Wt 161.4 lb

## 2016-11-10 DIAGNOSIS — F325 Major depressive disorder, single episode, in full remission: Secondary | ICD-10-CM | POA: Diagnosis not present

## 2016-11-10 DIAGNOSIS — G301 Alzheimer's disease with late onset: Secondary | ICD-10-CM

## 2016-11-10 DIAGNOSIS — I5042 Chronic combined systolic (congestive) and diastolic (congestive) heart failure: Secondary | ICD-10-CM | POA: Diagnosis not present

## 2016-11-10 DIAGNOSIS — E559 Vitamin D deficiency, unspecified: Secondary | ICD-10-CM | POA: Diagnosis not present

## 2016-11-10 DIAGNOSIS — R7303 Prediabetes: Secondary | ICD-10-CM | POA: Diagnosis not present

## 2016-11-10 DIAGNOSIS — E782 Mixed hyperlipidemia: Secondary | ICD-10-CM | POA: Diagnosis not present

## 2016-11-10 DIAGNOSIS — E039 Hypothyroidism, unspecified: Secondary | ICD-10-CM | POA: Diagnosis not present

## 2016-11-10 DIAGNOSIS — F0280 Dementia in other diseases classified elsewhere without behavioral disturbance: Secondary | ICD-10-CM | POA: Diagnosis not present

## 2016-11-10 DIAGNOSIS — I10 Essential (primary) hypertension: Secondary | ICD-10-CM | POA: Diagnosis not present

## 2016-11-10 DIAGNOSIS — Z79899 Other long term (current) drug therapy: Secondary | ICD-10-CM

## 2016-11-11 LAB — HEPATIC FUNCTION PANEL
AG RATIO: 1.5 (calc) (ref 1.0–2.5)
ALBUMIN MSPROF: 4 g/dL (ref 3.6–5.1)
ALT: 27 U/L (ref 9–46)
AST: 24 U/L (ref 10–35)
Alkaline phosphatase (APISO): 94 U/L (ref 40–115)
BILIRUBIN TOTAL: 0.6 mg/dL (ref 0.2–1.2)
Bilirubin, Direct: 0.1 mg/dL (ref 0.0–0.2)
GLOBULIN: 2.6 g/dL (ref 1.9–3.7)
Indirect Bilirubin: 0.5 mg/dL (calc) (ref 0.2–1.2)
TOTAL PROTEIN: 6.6 g/dL (ref 6.1–8.1)

## 2016-11-11 LAB — BASIC METABOLIC PANEL WITH GFR
BUN/Creatinine Ratio: 20 (calc) (ref 6–22)
BUN: 28 mg/dL — AB (ref 7–25)
CO2: 27 mmol/L (ref 20–32)
CREATININE: 1.43 mg/dL — AB (ref 0.70–1.11)
Calcium: 9.8 mg/dL (ref 8.6–10.3)
Chloride: 104 mmol/L (ref 98–110)
GFR, EST NON AFRICAN AMERICAN: 44 mL/min/{1.73_m2} — AB (ref >=60)
GFR, Est African American: 51 mL/min/{1.73_m2} — ABNORMAL LOW (ref >=60)
Glucose, Bld: 74 mg/dL (ref 65–99)
Potassium: 4.1 mmol/L (ref 3.5–5.3)
Sodium: 140 mmol/L (ref 135–146)

## 2016-11-11 LAB — CBC WITH DIFFERENTIAL/PLATELET
Basophils Absolute: 30 cells/uL (ref 0–200)
Basophils Relative: 0.5 %
Eosinophils Absolute: 171 cells/uL (ref 15–500)
Eosinophils Relative: 2.9 %
HCT: 42.9 % (ref 38.5–50.0)
Hemoglobin: 13.9 g/dL (ref 13.2–17.1)
Lymphs Abs: 1322 cells/uL (ref 850–3900)
MCH: 29.1 pg (ref 27.0–33.0)
MCHC: 32.4 g/dL (ref 32.0–36.0)
MCV: 89.9 fL (ref 80.0–100.0)
MONOS PCT: 9.5 %
MPV: 11.9 fL (ref 7.5–12.5)
NEUTROS PCT: 64.7 %
Neutro Abs: 3817 cells/uL (ref 1500–7800)
Platelets: 148 10*3/uL (ref 140–400)
RBC: 4.77 10*6/uL (ref 4.20–5.80)
RDW: 13.8 % (ref 11.0–15.0)
Total Lymphocyte: 22.4 %
WBC: 5.9 10*3/uL (ref 3.8–10.8)
WBCMIX: 561 {cells}/uL (ref 200–950)

## 2016-11-11 LAB — TSH: TSH: 2.08 m[IU]/L (ref 0.40–4.50)

## 2016-11-11 LAB — SPECIMEN ID NOTIFICATION MISSING 2ND ID

## 2016-11-11 LAB — LIPID PANEL
Cholesterol: 203 mg/dL — ABNORMAL HIGH
HDL: 42 mg/dL
LDL Cholesterol (Calc): 129 mg/dL — ABNORMAL HIGH
Non-HDL Cholesterol (Calc): 161 mg/dL — ABNORMAL HIGH
Total CHOL/HDL Ratio: 4.8 (calc)
Triglycerides: 188 mg/dL — ABNORMAL HIGH

## 2016-11-11 LAB — HEMOGLOBIN A1C
Hgb A1c MFr Bld: 5.4 %{Hb}
Mean Plasma Glucose: 108 (calc)
eAG (mmol/L): 6 (calc)

## 2016-11-11 LAB — MAGNESIUM: Magnesium: 2.2 mg/dL (ref 1.5–2.5)

## 2016-11-11 LAB — VITAMIN D 25 HYDROXY (VIT D DEFICIENCY, FRACTURES): Vit D, 25-Hydroxy: 47 ng/mL (ref 30–100)

## 2016-11-13 LAB — INSULIN, FASTING: Insulin: 16.9 u[IU]/mL (ref 2.0–19.6)

## 2016-11-28 ENCOUNTER — Telehealth: Payer: Self-pay | Admitting: *Deleted

## 2016-11-28 MED ORDER — PHENTERMINE HCL 37.5 MG PO TABS: ORAL_TABLET | ORAL | 0 refills | Status: DC

## 2016-11-28 NOTE — Telephone Encounter (Signed)
Patient's daughter called and states the patient's memory is getting worse and he cannot remember he has eaten.  He wants to eat every 15 minutes to 2 hours and drinks a gallon of milk daily.  Per Dr Melford Aase, send an RX for Phentermine 37.5 mg to curb his appetite.  The daughter was advised to start with 1/2 tablet in the morning and can increase if he does not decrease his appetite.

## 2017-01-04 ENCOUNTER — Ambulatory Visit (INDEPENDENT_AMBULATORY_CARE_PROVIDER_SITE_OTHER): Payer: Medicare Other | Admitting: *Deleted

## 2017-01-04 DIAGNOSIS — Z23 Encounter for immunization: Secondary | ICD-10-CM | POA: Diagnosis not present

## 2017-01-05 ENCOUNTER — Encounter (HOSPITAL_COMMUNITY): Payer: Self-pay | Admitting: Emergency Medicine

## 2017-01-05 ENCOUNTER — Inpatient Hospital Stay (HOSPITAL_COMMUNITY)
Admission: EM | Admit: 2017-01-05 | Discharge: 2017-01-07 | DRG: 312 | Disposition: A | Discharge status: Home / Self Care | Payer: Medicare Other | Attending: Internal Medicine | Admitting: Internal Medicine

## 2017-01-05 ENCOUNTER — Emergency Department (HOSPITAL_COMMUNITY): Payer: Medicare Other

## 2017-01-05 DIAGNOSIS — I5030 Unspecified diastolic (congestive) heart failure: Secondary | ICD-10-CM | POA: Diagnosis not present

## 2017-01-05 DIAGNOSIS — Z66 Do not resuscitate: Secondary | ICD-10-CM | POA: Diagnosis not present

## 2017-01-05 DIAGNOSIS — N179 Acute kidney failure, unspecified: Secondary | ICD-10-CM | POA: Diagnosis not present

## 2017-01-05 DIAGNOSIS — Z79899 Other long term (current) drug therapy: Secondary | ICD-10-CM

## 2017-01-05 DIAGNOSIS — E039 Hypothyroidism, unspecified: Secondary | ICD-10-CM | POA: Diagnosis present

## 2017-01-05 DIAGNOSIS — I951 Orthostatic hypotension: Principal | ICD-10-CM | POA: Diagnosis present

## 2017-01-05 DIAGNOSIS — Z87891 Personal history of nicotine dependence: Secondary | ICD-10-CM

## 2017-01-05 DIAGNOSIS — I130 Hypertensive heart and chronic kidney disease with heart failure and stage 1 through stage 4 chronic kidney disease, or unspecified chronic kidney disease: Secondary | ICD-10-CM | POA: Diagnosis not present

## 2017-01-05 DIAGNOSIS — F0390 Unspecified dementia without behavioral disturbance: Secondary | ICD-10-CM | POA: Diagnosis present

## 2017-01-05 DIAGNOSIS — Z8551 Personal history of malignant neoplasm of bladder: Secondary | ICD-10-CM

## 2017-01-05 DIAGNOSIS — R404 Transient alteration of awareness: Secondary | ICD-10-CM | POA: Diagnosis not present

## 2017-01-05 DIAGNOSIS — R031 Nonspecific low blood-pressure reading: Secondary | ICD-10-CM | POA: Diagnosis not present

## 2017-01-05 DIAGNOSIS — Z8673 Personal history of transient ischemic attack (TIA), and cerebral infarction without residual deficits: Secondary | ICD-10-CM

## 2017-01-05 DIAGNOSIS — E871 Hypo-osmolality and hyponatremia: Secondary | ICD-10-CM | POA: Diagnosis present

## 2017-01-05 DIAGNOSIS — R55 Syncope and collapse: Secondary | ICD-10-CM | POA: Diagnosis present

## 2017-01-05 DIAGNOSIS — N183 Chronic kidney disease, stage 3 (moderate): Secondary | ICD-10-CM | POA: Diagnosis present

## 2017-01-05 HISTORY — DX: Hypothyroidism, unspecified: E03.9

## 2017-01-05 HISTORY — DX: Unspecified dementia, unspecified severity, without behavioral disturbance, psychotic disturbance, mood disturbance, and anxiety: F03.90

## 2017-01-05 LAB — BASIC METABOLIC PANEL
ANION GAP: 8 (ref 5–15)
BUN: 38 mg/dL — ABNORMAL HIGH (ref 6–20)
CALCIUM: 8.6 mg/dL — AB (ref 8.9–10.3)
CHLORIDE: 100 mmol/L — AB (ref 101–111)
CO2: 25 mmol/L (ref 22–32)
CREATININE: 1.9 mg/dL — AB (ref 0.61–1.24)
GFR calc non Af Amer: 30 mL/min — ABNORMAL LOW (ref >60)
GFR, EST AFRICAN AMERICAN: 35 mL/min — AB (ref >60)
Glucose, Bld: 154 mg/dL — ABNORMAL HIGH (ref 65–99)
Potassium: 3.6 mmol/L (ref 3.5–5.1)
SODIUM: 133 mmol/L — AB (ref 135–145)

## 2017-01-05 LAB — I-STAT TROPONIN, ED: Troponin i, poc: 0.06 ng/mL (ref 0.00–0.08)

## 2017-01-05 LAB — CBC
HCT: 39.2 % (ref 39.0–52.0)
Hemoglobin: 12.7 g/dL — ABNORMAL LOW (ref 13.0–17.0)
MCH: 29.2 pg (ref 26.0–34.0)
MCHC: 32.4 g/dL (ref 30.0–36.0)
MCV: 90.1 fL (ref 78.0–100.0)
PLATELETS: 149 10*3/uL — AB (ref 150–400)
RBC: 4.35 MIL/uL (ref 4.22–5.81)
RDW: 14.8 % (ref 11.5–15.5)
WBC: 7 10*3/uL (ref 4.0–10.5)

## 2017-01-05 LAB — BRAIN NATRIURETIC PEPTIDE: B NATRIURETIC PEPTIDE 5: 202.3 pg/mL — AB (ref 0.0–100.0)

## 2017-01-05 LAB — CBG MONITORING, ED: Glucose-Capillary: 167 mg/dL — ABNORMAL HIGH (ref 65–99)

## 2017-01-05 MED ORDER — SODIUM CHLORIDE 0.9 % IV BOLUS (SEPSIS)
1000.0000 mL | Freq: Once | INTRAVENOUS | Status: AC
  Administered 2017-01-05: 1000 mL via INTRAVENOUS

## 2017-01-05 NOTE — ED Triage Notes (Signed)
BIB EMS from home, pt was at dinner table when he had syncopal episode, found pt with face down on table. By time EMS arrival pt A/O, pt has hx of dementia and per family pt was at his baseline. Hypotensive initially 80/40, given 700NS, last BP EMS 96/44. CBG 141

## 2017-01-05 NOTE — ED Notes (Signed)
Nurse currently starting IV and drawing labs. 

## 2017-01-05 NOTE — ED Notes (Signed)
This nurse went into pt's room to hang NS fluid ordered. Pt's family states his name is wrong and they have told 4 people and no one has done anything about it.  This RN stated to family that this is the first I have heard of it and I would find out where we are in the process of the changeover.  Pt's family states "we are being mistreated and he is being mistreated because this happened".  This RN explained to family that I'm sorry this happened and I am here trying to take care of the patient.  Pt's family states "I don't appreciate you being condescending".  This RN apologized that they feel this way and excused myself out the door to ask registration.

## 2017-01-05 NOTE — ED Provider Notes (Signed)
Level 5 Caveat Secondary to Dementia   [facility] Provider Note   CSN: [id number] Arrival date & time: 01/05/17  2015     History   Chief Complaint Chief Complaint  Patient presents with  . Loss of Consciousness  . Hypotension    HPI [name] [name] is a 83 y.o. male.  Level 5 Caveat, patient unable to provide history secondary to apparent dementia (documented by triage). Patient does not remember losing consciousness and has no complaints. He does state Poindexter is his middle name and his last name is Marinello, will attempt to find records.  Was able to obtain information from patient's family when they arrived. Patient was sitting at the table eating when he suddenly passed out. The event was not witnessed but his daughter heard him coughing some and then heard a  "thud" as his head hit the table. The helped him to the floor and called 911. The patient was noted to be pale, laterally shaking his upper and lower extremities. The episode last about 5-10 minutes and the patient had returned to his baseline after regaining consciousness. Family states that the patient has no history of similar episodes.      No past medical history on file.  There are no active problems to display for this patient.   No past surgical history on file.     Home Medications    Prior to Admission medications   Medication Sig Start Date End Date Taking? Authorizing Provider  cholecalciferol (VITAMIN D) 1000 units tablet Take 1,000 Units by mouth daily.   Yes [provider]  escitalopram (LEXAPRO) 10 MG tablet Take 10 mg by mouth daily.   Yes [provider]  furosemide (LASIX) 40 MG tablet Take 40 mg by mouth 2 (two) times daily.   Yes [provider]  levothyroxine (SYNTHROID, LEVOTHROID) 75 MCG tablet Take 75 mcg by mouth daily before breakfast.   Yes [provider]  potassium chloride (K-DUR) 10 MEQ tablet Take  10 mEq by mouth daily.   Yes [provider]  QUEtiapine (SEROQUEL) 100 MG tablet Take 100 mg by mouth 2 (two) times daily.   Yes [provider]    Family History No family history on file.  Social History Social History  Substance Use Topics  . Smoking status: Not on file  . Smokeless tobacco: Not on file  . Alcohol use Not on file     Allergies   Penicillins   Review of Systems Review of Systems  Constitutional: Negative for activity change, chills, diaphoresis and fever.  HENT: Negative for hearing loss.   Eyes: Negative for visual disturbance.  Respiratory: Negative for cough, chest tightness and shortness of breath.   Cardiovascular: Negative for chest pain and leg swelling.  Gastrointestinal: Negative for abdominal distention, abdominal pain, constipation, diarrhea, nausea and vomiting.  Genitourinary: Negative for difficulty urinating, dysuria and frequency.  Musculoskeletal: Negative for arthralgias and back pain.  Neurological: Negative for dizziness, light-headedness and headaches.  All other systems reviewed and are negative.    Physical Exam Updated Vital Signs BP (!) 148/77   Pulse 64   Temp 97.6 F (36.4 C) (Oral)   Resp 18   Ht 5\' 11"  (1.803 m)   Wt 77.1 kg (170 lb)   SpO2 100%   BMI 23.71 kg/m   Physical Exam  Constitutional: He appears well-developed and well-nourished.  HENT:  Head: Normocephalic and atraumatic.  Eyes: EOM are normal. Right  eye exhibits no discharge.  Cardiovascular: Normal rate and regular rhythm.   Distant heart sounds  Pulmonary/Chest: Effort normal. No respiratory distress. He has rales.  Bibasilar rales  Abdominal: Soft. Bowel sounds are normal. He exhibits no distension. There is no tenderness.  Musculoskeletal: He exhibits no edema or deformity.  Neurological: He is alert.  Oriented to person and place only  Skin: Skin is warm and dry.     ED Treatments / Results  Labs (all labs ordered are  listed, but only abnormal results are displayed) Labs Reviewed  BASIC METABOLIC PANEL - Abnormal; Notable for the following:       Result Value   Sodium 133 (*)    Chloride 100 (*)    Glucose, Bld 154 (*)    BUN 38 (*)    Creatinine, Ser 1.90 (*)    Calcium 8.6 (*)    GFR calc non Af Amer 30 (*)    GFR calc Af Amer 35 (*)    All other components within normal limits  CBC - Abnormal; Notable for the following:    Hemoglobin 12.7 (*)    Platelets 149 (*)    All other components within normal limits  BRAIN NATRIURETIC PEPTIDE - Abnormal; Notable for the following:    B Natriuretic Peptide 202.3 (*)    All other components within normal limits  CBG MONITORING, ED - Abnormal; Notable for the following:    Glucose-Capillary 167 (*)    All other components within normal limits  URINALYSIS, ROUTINE W REFLEX MICROSCOPIC  CBG MONITORING, ED  I-STAT TROPONIN, ED    EKG  EKG Interpretation  Date/Time:  Friday January 05 2017 20:21:53 EDT Ventricular Rate:  62 PR Interval:    QRS Duration: 166 QT Interval:  475 QTC Calculation: 483 R Axis:   2 Text Interpretation:  Sinus rhythm Prolonged PR interval Right bundle branch block No previous tracing Confirmed by Blanchie Dessert [phone]) on 01/05/2017 9:40:05 PM       Radiology Dg Chest 2 View  Result Date: 01/05/2017 CLINICAL DATA:  Syncopal episode at dinner.  History of dementia. EXAM: CHEST  2 VIEW COMPARISON:  Chest radiograph April 13, 2016 FINDINGS: Cardiac silhouette is moderately enlarged and unchanged. Mediastinal silhouette is nonsuspicious, calcified aortic knob. Chronic interstitial changes. Strandy densities bilateral lung bases. No pleural effusion. No pneumothorax. Surgical clips project in RIGHT chest wall. Soft tissue planes and included osseous structures are nonacute. Bold mild L1 compression fracture. IMPRESSION: Stable cardiomegaly. Mild chronic interstitial changes with minimal bibasilar atelectasis/ scarring.  Aortic Atherosclerosis (ICD10-I70.0). Electronically Signed   By: Elon Alas M.D.   On: 01/05/2017 22:29    Procedures Procedures (including critical care time)  Medications Ordered in ED Medications  sodium chloride 0.9 % bolus 1,000 mL (1,000 mLs Intravenous New Bag/Given 01/05/17 2146)     Initial Impression / Assessment and Plan / ED Course  I have reviewed the triage vital signs and the nursing notes.  Pertinent labs & imaging results that were available during my care of the patient were reviewed by me and considered in my medical decision making (see chart for details).    Patient presenting following an episode of syncope with cough noted prior to the event. Suspicious for vasovagal. Patient also hypotensive on presentation. Patient remains hypotensive despite 79mL via EMS, will bolus with additional fluids. EKG sinus rhythm with RBBB. - CBG: 167 - CBC: Hgb 12.7 - BMP: Na 133, Cl 100, BUN 38, Cr 1.9 (Consistent with  his CKD). - Troponin (I-stat): 0.06 - BNP: 202 - CXR: Stable cardiomegaly. Mild chronic interstitial changes with minimal bibasilar atelectasis/ scarring. - U/A: Pending on Admission (not yet collected)  Patient's orthostatic vitals were positive for HR increase >10 from sitting to standing. BP improved with 1L bolus to 140s/70s. BNP mildly elevated at 202. Patient to be admitted for continued syncope workup. - Consult to Internal Medicine for admission.  Final Clinical Impressions(s) / ED Diagnoses   Final diagnoses:  Syncope and collapse    New Prescriptions New Prescriptions   No medications on file     Neva Seat, MD 01/06/17 Franne Forts, MD 01/06/17 Evelina Dun, MD 01/08/17 [phone]

## 2017-01-05 NOTE — ED Notes (Signed)
X-Ray called this RN stating pt's IV somehow got ripped out while in x-ray and its bleeding a lot.  Advised to apply pressure until bleeding stopped.  This RN also questioned how pt was taken to x-ray as I was not notified pt was taken and has the wrong armband on currently.

## 2017-01-06 ENCOUNTER — Encounter (HOSPITAL_COMMUNITY): Payer: Self-pay | Admitting: Internal Medicine

## 2017-01-06 ENCOUNTER — Observation Stay (HOSPITAL_COMMUNITY): Payer: Medicare Other

## 2017-01-06 DIAGNOSIS — N183 Chronic kidney disease, stage 3 (moderate): Secondary | ICD-10-CM

## 2017-01-06 DIAGNOSIS — I5030 Unspecified diastolic (congestive) heart failure: Secondary | ICD-10-CM | POA: Diagnosis present

## 2017-01-06 DIAGNOSIS — E039 Hypothyroidism, unspecified: Secondary | ICD-10-CM | POA: Diagnosis present

## 2017-01-06 DIAGNOSIS — R748 Abnormal levels of other serum enzymes: Secondary | ICD-10-CM | POA: Diagnosis not present

## 2017-01-06 DIAGNOSIS — I130 Hypertensive heart and chronic kidney disease with heart failure and stage 1 through stage 4 chronic kidney disease, or unspecified chronic kidney disease: Secondary | ICD-10-CM | POA: Diagnosis present

## 2017-01-06 DIAGNOSIS — F0390 Unspecified dementia without behavioral disturbance: Secondary | ICD-10-CM | POA: Diagnosis present

## 2017-01-06 DIAGNOSIS — R55 Syncope and collapse: Secondary | ICD-10-CM

## 2017-01-06 DIAGNOSIS — Z66 Do not resuscitate: Secondary | ICD-10-CM | POA: Diagnosis present

## 2017-01-06 DIAGNOSIS — Z87891 Personal history of nicotine dependence: Secondary | ICD-10-CM | POA: Diagnosis not present

## 2017-01-06 DIAGNOSIS — Z79899 Other long term (current) drug therapy: Secondary | ICD-10-CM | POA: Diagnosis not present

## 2017-01-06 DIAGNOSIS — Z8673 Personal history of transient ischemic attack (TIA), and cerebral infarction without residual deficits: Secondary | ICD-10-CM | POA: Diagnosis not present

## 2017-01-06 DIAGNOSIS — I951 Orthostatic hypotension: Secondary | ICD-10-CM | POA: Diagnosis present

## 2017-01-06 DIAGNOSIS — N179 Acute kidney failure, unspecified: Secondary | ICD-10-CM | POA: Diagnosis present

## 2017-01-06 DIAGNOSIS — E871 Hypo-osmolality and hyponatremia: Secondary | ICD-10-CM | POA: Diagnosis present

## 2017-01-06 DIAGNOSIS — Z8551 Personal history of malignant neoplasm of bladder: Secondary | ICD-10-CM | POA: Diagnosis not present

## 2017-01-06 LAB — URINALYSIS, ROUTINE W REFLEX MICROSCOPIC
BACTERIA UA: NONE SEEN
BILIRUBIN URINE: NEGATIVE
Glucose, UA: NEGATIVE mg/dL
Ketones, ur: NEGATIVE mg/dL
Leukocytes, UA: NEGATIVE
Nitrite: NEGATIVE
PH: 6 (ref 5.0–8.0)
Protein, ur: NEGATIVE mg/dL
SPECIFIC GRAVITY, URINE: 1.01 (ref 1.005–1.030)

## 2017-01-06 LAB — TROPONIN I
TROPONIN I: 0.05 ng/mL — AB (ref <0.03)
TROPONIN I: 0.06 ng/mL — AB (ref <0.03)
TROPONIN I: 0.06 ng/mL — AB (ref <0.03)

## 2017-01-06 LAB — GLUCOSE, CAPILLARY: GLUCOSE-CAPILLARY: 100 mg/dL — AB (ref 65–99)

## 2017-01-06 MED ORDER — ESCITALOPRAM OXALATE 10 MG PO TABS
10.0000 mg | ORAL_TABLET | Freq: Every day | ORAL | Status: DC
  Administered 2017-01-06 – 2017-01-07 (×2): 10 mg via ORAL
  Filled 2017-01-06 (×2): qty 1

## 2017-01-06 MED ORDER — LEVOTHYROXINE SODIUM 75 MCG PO TABS
75.0000 ug | ORAL_TABLET | Freq: Every day | ORAL | Status: DC
  Administered 2017-01-06 – 2017-01-07 (×2): 75 ug via ORAL
  Filled 2017-01-06 (×2): qty 1

## 2017-01-06 MED ORDER — FUROSEMIDE 20 MG PO TABS: 40.0000 mg | ORAL_TABLET | Freq: Two times a day (BID) | ORAL | Status: DC

## 2017-01-06 MED ORDER — SODIUM CHLORIDE 0.9% FLUSH
3.0000 mL | Freq: Two times a day (BID) | INTRAVENOUS | Status: DC
  Administered 2017-01-06 (×2): 3 mL via INTRAVENOUS

## 2017-01-06 MED ORDER — SODIUM CHLORIDE 0.9 % IV SOLN
INTRAVENOUS | Status: DC
  Administered 2017-01-06 – 2017-01-07 (×2): via INTRAVENOUS

## 2017-01-06 MED ORDER — POTASSIUM CHLORIDE ER 10 MEQ PO TBCR: 10.0000 meq | EXTENDED_RELEASE_TABLET | Freq: Every day | ORAL | Status: DC

## 2017-01-06 MED ORDER — QUETIAPINE FUMARATE 100 MG PO TABS
100.0000 mg | ORAL_TABLET | Freq: Two times a day (BID) | ORAL | Status: DC
  Administered 2017-01-06 – 2017-01-07 (×4): 100 mg via ORAL
  Filled 2017-01-06 (×2): qty 1
  Filled 2017-01-06 (×4): qty 2
  Filled 2017-01-06 (×2): qty 1

## 2017-01-06 MED ORDER — VITAMIN D 1000 UNITS PO TABS
1000.0000 [IU] | ORAL_TABLET | Freq: Every day | ORAL | Status: DC
  Administered 2017-01-06 – 2017-01-07 (×2): 1000 [IU] via ORAL
  Filled 2017-01-06 (×2): qty 1

## 2017-01-06 NOTE — Progress Notes (Signed)
Pt's daughter Beverlee Nims called and updated with the pending status of patient's urinalysis. Furthermore, daughter requests MD to give her a call in the morning for any updates especially with patient's lab results. Will relay to AM nurse.

## 2017-01-06 NOTE — Plan of Care (Signed)
Problem: Safety: Goal: Ability to remain free from injury will improve Outcome: Progressing Bed alarm on. Call bell within reach Assist x1 No fall this shift

## 2017-01-06 NOTE — ED Notes (Signed)
Pt's family at nurses station states MD went in the room when they were out eating and would like to know what he said.

## 2017-01-06 NOTE — Progress Notes (Addendum)
CRITICAL VALUE ALERT  Critical Value:  Troponin 0.05  Date & Time Notied:  01/06/2017 0216  Provider Notified: Alcario Drought, MD  Orders Received/Actions taken: Awaiting return call

## 2017-01-06 NOTE — ED Notes (Signed)
Pt's family going towards waiting room on phone call

## 2017-01-06 NOTE — ED Notes (Signed)
Patient transported to CT 

## 2017-01-06 NOTE — H&P (Signed)
History and Physical    [name] [name] [id number]:[id number] DOB: 1928/02/04 DOA: 01/05/2017  PCP: Tammi Sou, MD  Patient coming from: Home  I have personally briefly reviewed patient's old medical records in [place]  Chief Complaint: Syncope  HPI: [name] [name] is a 83 y.o. male with medical history significant of HTN, dementia, grade 1 diastolic CHF.  Patient presents to the ED after a syncopal episode that occurred while eating dinner tonight.  Family heard a thud, patients head was on table and he had shaking in upper and lower extremities.  Of note patient has had decreased PO intake over the past 1 month, he is on lasix 40mg  Po BID.  EMS arrival, BP 90s initially.  Patient unresponsive for 5-10 mins before he woke up per family.  Now back to baseline.  Patient has no memory of incident but this isnt unusual as he has dementia per family.   ED Course: Given 1L NS bolus.  BPs 341 systolic.  Family does note significant DOE over past few weeks.   Review of Systems: Unable to perform secondary to dementia.   Past Medical History:  Diagnosis Date  . Bladder cancer ([place])   . Dementia   . HTN (hypertension)   . Hypothyroidism   . Stroke [facility]     Past Surgical History:  Procedure Laterality Date  . BLADDER SURGERY    . PROSTATE SURGERY       reports that he has quit smoking. He does not have any smokeless tobacco history on file. He reports that he does not drink alcohol or use drugs.  Allergies  Allergen Reactions  . Penicillins Rash    Family History  Problem Relation Age of Onset  . Heart disease Mother      Prior to Admission medications   Medication Sig Start Date End Date Taking? Authorizing Provider  cholecalciferol (VITAMIN D) 1000 units tablet Take 1,000 Units by mouth daily.   Yes [provider]  escitalopram (LEXAPRO) 10 MG tablet Take 10 mg by mouth daily.   Yes [provider]  furosemide (LASIX) 40 MG tablet Take  40 mg by mouth 2 (two) times daily.   Yes [provider]  levothyroxine (SYNTHROID, LEVOTHROID) 75 MCG tablet Take 75 mcg by mouth daily before breakfast.   Yes [provider]  potassium chloride (K-DUR) 10 MEQ tablet Take 10 mEq by mouth daily.   Yes [provider]  QUEtiapine (SEROQUEL) 100 MG tablet Take 100 mg by mouth 2 (two) times daily.   Yes [provider]    Physical Exam: Vitals:   01/05/17 2230 01/05/17 2315 01/05/17 2330 01/06/17 0000  BP: (!) 100/54 (!) 100/52 (!) 101/54 (!) 148/77  Pulse: (!) 58 (!) 54 (!) 56 64  Resp: (!) 22 16 18 18   Temp:      TempSrc:      SpO2: 100% 100% 100% 100%  Weight:      Height:        Constitutional: NAD, calm, comfortable Eyes: PERRL, lids and conjunctivae normal ENMT: Mucous membranes are moist. Posterior pharynx clear of any exudate or lesions.Normal dentition.  Neck: normal, supple, no masses, no thyromegaly Respiratory: clear to auscultation bilaterally, no wheezing, no crackles. Normal respiratory effort. No accessory muscle use.  Cardiovascular: Regular rate and rhythm, no murmurs / rubs / gallops. No extremity edema. 2+ pedal pulses. No carotid bruits.  Abdomen: no tenderness, no masses palpated. No hepatosplenomegaly. Bowel sounds positive.  Musculoskeletal: no clubbing / cyanosis. No joint deformity upper and lower extremities. Good ROM, no contractures. Normal muscle tone.  Skin: no rashes, lesions, ulcers. No induration Neurologic: CN 2-12 grossly intact. Sensation intact, DTR normal. Strength 5/5 in all 4.  Psychiatric: Not speaking much, doesn't remember events of earlier today, dementia with memory loss.    Labs on Admission: I have personally reviewed following labs and imaging studies  CBC:  Recent Labs Lab 01/05/17 2028  WBC 7.0  HGB 12.7*  HCT 39.2  MCV 90.1  PLT 196*   Basic Metabolic Panel:  Recent Labs Lab 01/05/17 2028  NA 133*  K 3.6  CL 100*  CO2 25    GLUCOSE 154*  BUN 38*  CREATININE 1.90*  CALCIUM 8.6*   GFR: Estimated Creatinine Clearance: 29.2 mL/min (A) (by C-G formula based on SCr of 1.9 mg/dL (H)). Liver Function Tests: No results for input(s): AST, ALT, ALKPHOS, BILITOT, PROT, ALBUMIN in the last 168 hours. No results for input(s): LIPASE, AMYLASE in the last 168 hours. No results for input(s): AMMONIA in the last 168 hours. Coagulation Profile: No results for input(s): INR, PROTIME in the last 168 hours. Cardiac Enzymes: No results for input(s): CKTOTAL, CKMB, CKMBINDEX, TROPONINI in the last 168 hours. BNP (last 3 results) No results for input(s): PROBNP in the last 8760 hours. HbA1C: No results for input(s): HGBA1C in the last 72 hours. CBG:  Recent Labs Lab 01/05/17 2018  GLUCAP 167*   Lipid Profile: No results for input(s): CHOL, HDL, LDLCALC, TRIG, CHOLHDL, LDLDIRECT in the last 72 hours. Thyroid Function Tests: No results for input(s): TSH, T4TOTAL, FREET4, T3FREE, THYROIDAB in the last 72 hours. Anemia Panel: No results for input(s): VITAMINB12, FOLATE, FERRITIN, TIBC, IRON, RETICCTPCT in the last 72 hours. Urine analysis: No results found for: COLORURINE, APPEARANCEUR, LABSPEC, PHURINE, GLUCOSEU, HGBUR, BILIRUBINUR, KETONESUR, PROTEINUR, UROBILINOGEN, NITRITE, LEUKOCYTESUR  Radiological Exams on Admission: Dg Chest 2 View  Result Date: 01/05/2017 CLINICAL DATA:  Syncopal episode at dinner.  History of dementia. EXAM: CHEST  2 VIEW COMPARISON:  Chest radiograph April 13, 2016 FINDINGS: Cardiac silhouette is moderately enlarged and unchanged. Mediastinal silhouette is nonsuspicious, calcified aortic knob. Chronic interstitial changes. Strandy densities bilateral lung bases. No pleural effusion. No pneumothorax. Surgical clips project in RIGHT chest wall. Soft tissue planes and included osseous structures are nonacute. Bold mild L1 compression fracture. IMPRESSION: Stable cardiomegaly. Mild chronic  interstitial changes with minimal bibasilar atelectasis/ scarring. Aortic Atherosclerosis (ICD10-I70.0). Electronically Signed   By: Elon Alas M.D.   On: 01/05/2017 22:29    EKG: Independently reviewed.  Assessment/Plan Principal Problem:   Syncope Active Problems:   CKD (chronic kidney disease) stage 3, GFR 30-59 ml/min (HCC)    1. Syncope - 1. Syncope pathway 2. Hold home lasix 3. Got 1L NS bolus in ED 4. Tele monitor 5. Just had echo done in August 6. Serial trops 7. Coronary is possible given the DOE, but cards elected not to do stress test back in Feb this year due to patient age and dementia.  Not clear he would be candidate for stress test and stent. 1. Probably want to touch base with cards in AM. 8. UA pending 2. CKD stage 3 - 1. Creat appears to match prior baseline  DVT prophylaxis: SCDs - hit head, CT head pending Code Status: DNR - confirmed with family Family Communication: Family at bedside Disposition Plan: Home after admit Consults called: None Admission status: Place in [place], Moira Umholtz  M. DO [facility] Pager [phone]  If 7AM-7PM, please contact day team taking care of patient [email or web address] Password [email or web address]  01/06/2017, 1:07 AM

## 2017-01-06 NOTE — Progress Notes (Signed)
PROGRESS NOTE  [name] [name] VQM:[id number] DOB: 1927-11-05 DOA: 01/05/2017 PCP: Tammi Sou, MD  HPI/Recap of past 73 hours: 83 year old male with past medical history significant for hypertension, dementia, hypothyroidism, grade 1 diastolic CHF presents to the ED after a witnessed syncopal episode that occurred while patient was eating dinner.  Patient lives with her daughter due to his dementia.  As per the daughter, patient was unresponsive for about 5-10 minutes.  Daughter also noted patient's has had decreased p.o. intake over the past 2 weeks with also dyspnea on exertion.  Patient was noted to be orthostatic, with mildly elevated troponin in the ED. patient admitted for further management  Today, patient denied any symptoms.  Denied any chest pain, worsening shortness of breath, abdominal pain, dysuria, nausea/vomiting, diaphoresis.  Assessment/Plan: Principal Problem:   Syncope Active Problems:   CKD (chronic kidney disease) stage 3, GFR 30-59 ml/min (HCC)  #Syncope Resolved, no further episodes Likely due to orthostatics, responded to IV fluids Of note patient taking 40 mg twice daily of Lasix, hold for now, recommend titrating down upon discharge No arrhythmias noted on telemetry Echo done 9/32 showed diastolic dysfunction grade 1 CT head showed no mass or hemorrhage Gentle hydration Telemetry Cardiology consulted  #Elevated troponin Denies any chest pain EKG negative for ischemia Past troponins noted with the same regions of around 0.05 Seen in the past, no planned cardiac workup given his history of dementia Cardiology consulted  #??AK I Creatinine was 1.9, baseline around 1.4 Continue hydration BMP  #hypothyroidism Continue Synthroid  #Dementia At baseline     Code Status: DNR  Family Communication: Discussed extensively with daughter  Disposition Plan: Home once  stable   Consultants:  Cardiology  Procedures:  None  Antimicrobials:  None  DVT prophylaxis: SCDs   Objective: Vitals:   01/06/17 0115 01/06/17 0235 01/06/17 0513 01/06/17 0847  BP: (!) 99/59 (!) 110/47 (!) 104/43 (!) 110/48  Pulse: 65 62 (!) 54 60  Resp: (!) 22 20 18 18   Temp:  98.3 F (36.8 C) 98.7 F (37.1 C) 98.4 F (36.9 C)  TempSrc:  Oral Oral Oral  SpO2: 97% 98% 97% 98%  Weight:  71.6 kg (157 lb 14.4 oz)    Height:  5\' 11"  (1.803 m)      Intake/Output Summary (Last 24 hours) at 01/06/17 1820 Last data filed at 01/06/17 1400  Gross per 24 hour  Intake             2058 ml  Output              750 ml  Net             1308 ml   Filed Weights   01/05/17 2023 01/06/17 0235  Weight: 77.1 kg (170 lb) 71.6 kg (157 lb 14.4 oz)    Exam:   General: Alert, awake  Cardiovascular: S1-S2 present no added heart sounds  Respiratory: Chest clear bilaterally  Abdomen: Soft, nontender, nondistended  Musculoskeletal: No pedal edema bilaterally  Skin: Normal  Psychiatry: Normal mood   Data Reviewed: CBC:  Recent Labs Lab 01/05/17 2028  WBC 7.0  HGB 12.7*  HCT 39.2  MCV 90.1  PLT 671*   Basic Metabolic Panel:  Recent Labs Lab 01/05/17 2028  NA 133*  K 3.6  CL 100*  CO2 25  GLUCOSE 154*  BUN 38*  CREATININE 1.90*  CALCIUM 8.6*   GFR: Estimated Creatinine Clearance: 27.7 mL/min (A) (by C-G formula based on  SCr of 1.9 mg/dL (H)). Liver Function Tests: No results for input(s): AST, ALT, ALKPHOS, BILITOT, PROT, ALBUMIN in the last 168 hours. No results for input(s): LIPASE, AMYLASE in the last 168 hours. No results for input(s): AMMONIA in the last 168 hours. Coagulation Profile: No results for input(s): INR, PROTIME in the last 168 hours. Cardiac Enzymes:  Recent Labs Lab 01/06/17 0056 01/06/17 0842 01/06/17 1402  TROPONINI 0.05* 0.06* 0.06*   BNP (last 3 results) No results for input(s): PROBNP in the last 8760  hours. HbA1C: No results for input(s): HGBA1C in the last 72 hours. CBG:  Recent Labs Lab 01/05/17 2018 01/06/17 0516  GLUCAP 167* 100*   Lipid Profile: No results for input(s): CHOL, HDL, LDLCALC, TRIG, CHOLHDL, LDLDIRECT in the last 72 hours. Thyroid Function Tests: No results for input(s): TSH, T4TOTAL, FREET4, T3FREE, THYROIDAB in the last 72 hours. Anemia Panel: No results for input(s): VITAMINB12, FOLATE, FERRITIN, TIBC, IRON, RETICCTPCT in the last 72 hours. Urine analysis: No results found for: COLORURINE, APPEARANCEUR, LABSPEC, PHURINE, GLUCOSEU, HGBUR, BILIRUBINUR, KETONESUR, PROTEINUR, UROBILINOGEN, NITRITE, LEUKOCYTESUR Sepsis Labs: @LABRCNTIP (procalcitonin:4,lacticidven:4)  )No results found for this or any previous visit (from the past 240 hour(s)).    Studies: Dg Chest 2 View  Result Date: 01/05/2017 CLINICAL DATA:  Syncopal episode at dinner.  History of dementia. EXAM: CHEST  2 VIEW COMPARISON:  Chest radiograph April 13, 2016 FINDINGS: Cardiac silhouette is moderately enlarged and unchanged. Mediastinal silhouette is nonsuspicious, calcified aortic knob. Chronic interstitial changes. Strandy densities bilateral lung bases. No pleural effusion. No pneumothorax. Surgical clips project in RIGHT chest wall. Soft tissue planes and included osseous structures are nonacute. Bold mild L1 compression fracture. IMPRESSION: Stable cardiomegaly. Mild chronic interstitial changes with minimal bibasilar atelectasis/ scarring. Aortic Atherosclerosis (ICD10-I70.0). Electronically Signed   By: Elon Alas M.D.   On: 01/05/2017 22:29   Ct Head Wo Contrast  Result Date: 01/06/2017 CLINICAL DATA:  83 year old male with syncopal episode. History of dementia and stroke. EXAM: CT HEAD WITHOUT CONTRAST TECHNIQUE: Contiguous axial images were obtained from the base of the skull through the vertex without intravenous contrast. COMPARISON:  None. FINDINGS: Brain: There is moderate  diffuse brain atrophy with prominent involvement of the frontal lobes. Mild periventricular and deep white matter chronic microvascular ischemic changes noted. There is no acute intracranial hemorrhage. No mass effect or midline shift. Dilated extra-axial spaces with CSF density over the frontal lobes represent prominence of the subdural spaces due to volume loss, however small old hygroma are not excluded. Vascular: No hyperdense vessel or unexpected calcification. Skull: No acute calvarial pathology. A subcentimeter ovoid lesion in the right frontal bone superior to the right orbit noted which is not well characterized. There is slight remodeling of the anterior right orbital roof indicative of chronic etiology. Direct comparison with prior CT images, if available recommended. Nonemergent MRI may provide better characterization if no prior images are available. Sinuses/Orbits: Mild diffuse mucoperiosteal thickening of paranasal sinuses. No air-fluid levels. The mastoid air cells are clear. Other: None IMPRESSION: 1. No acute intracranial hemorrhage. 2. Moderate age-related atrophy and chronic microvascular ischemic changes. 3. Dilated extra-axial spaces secondary to volume loss versus less likely old hygroma. 4. Lucent lesion in the right frontal calvarium, chronic appearing. Direct comparison with prior images if available recommended. Electronically Signed   By: Anner Crete M.D.   On: 01/06/2017 02:27    Scheduled Meds: . cholecalciferol  1,000 Units Oral Daily  . escitalopram  10 mg Oral Daily  .  levothyroxine  75 mcg Oral QAC breakfast  . QUEtiapine  100 mg Oral BID  . sodium chloride flush  3 mL Intravenous Q12H    Continuous Infusions: . sodium chloride 75 mL/hr at 01/06/17 0848     LOS: 0 days     Alma Friendly, MD [facility]   If 7PM-7AM, please contact night-coverage [email or web address] Password [email or web address] 01/06/2017, 6:20 PM

## 2017-01-06 NOTE — Progress Notes (Signed)
OT Cancellation Note  Patient Details Name: [name] [name] MRN: [id number] DOB: 12-12-1927   Cancelled Treatment:    Reason Eval/Treat Not Completed: Medical issues which prohibited therapy (Troponins trending up, will hold therapy until resolved). OT will continue to follow for OT eval.   Merri Ray [name] 01/06/2017, 3:11 PM  Hulda Humphrey OTR/L [phone]

## 2017-01-06 NOTE — Consult Note (Signed)
Cardiology Consult    Patient ID: [name] [name] MRN: [id number], DOB/AGE: 1927/10/04   Admit date: 01/05/2017 Date of Consult: 01/06/2017  Primary Physician: Tammi Sou, MD Primary Cardiologist: Gwenlyn Found Requesting Provider: Horris Latino [name] for Consultation: Syncope  OCTAVIAN GODEK is a 83 y.o. male who is being seen today for the evaluation of Syncope at the request of Dr. Horris Latino.   Patient Profile    83 yo male with PMH of SB with RBBB, CAD?, Dementia, hypothyroidism, and Stroke who presented with syncope and mildly elevated troponin.   Past Medical History   Past Medical History:  Diagnosis Date  . Bladder cancer ([place])   . Dementia   . HTN (hypertension)   . Hypothyroidism   . Stroke [facility])     Past Surgical History:  Procedure Laterality Date  . BLADDER SURGERY    . PROSTATE SURGERY       Allergies  Allergies  Allergen Reactions  . Penicillins Rash    History of Present Illness    [name] [name] is an 83 yo male with PMH of SB with RBBB, CAD?, Dementia, hypothyroidism, and Stroke. He is followed by Dr. Gwenlyn Found as an outpatient. Previous office notes daughter reported patient had a catgreater than 5 years ago and reported having a "blockage in the right artery "but there is no documentation of that. He is admitted from his PCP office in December 2017 with CHF. Echo at that time showed EF of 40%, and BMP was 2000 on admission. He was seen in follow-up in January and April of this year, and reported being at baseline. He is on a beta blocker secondary to baseline bradycardia. He was seen in the office 8/18 by Kerin Ransom and reportedly doing well. Recently to the trip to the mountains, and tolerated this well. Daughter reported that they adjusted his Lasix dose is appropriate at home for weight gain. Stated the patient was stoic and doesn't complain often.   Follow-up echo was done that showed normal EF with LVH and G1DD.   He presented for admission after a  unwitnessed syncopal episode. Patient was eating dinner and family heard a "thud" in the other. Walked in to find the patient with his head on the table and shaking. Stated he was unresponsive for about 5-10 minutes and EMS was called. Systolic BP was in the 41D. In taling with the patient he has no memory of the event. Denies any chest pain, lightheadedness, palpitations, dizziness prior to this episode. States he has been getting along pretty well.   In the ED labs showed Na+ 133, Cr 1.9, BNP 202, Trop 0.05>>0.06., Hgb 12.7. EKG showed SR with known RBBB with 1st degree AVB. He was orthostatic when checked on admission, given IVFs.   Inpatient Medications    . cholecalciferol  1,000 Units Oral Daily  . escitalopram  10 mg Oral Daily  . levothyroxine  75 mcg Oral QAC breakfast  . QUEtiapine  100 mg Oral BID  . sodium chloride flush  3 mL Intravenous Q12H    Family History    Family History  Problem Relation Age of Onset  . Heart disease Mother     Social History    Social History   Social History  . Marital status: Married    Spouse name: N/A  . Number of children: N/A  . Years of education: N/A   Occupational History  . Not on file.   Social History Main Topics  . Smoking status:  Former Smoker  . Smokeless tobacco: Not on file  . Alcohol use No  . Drug use: No  . Sexual activity: Not on file   Other Topics Concern  . Not on file   Social History Narrative  . No narrative on file     Review of Systems    See HPI  All other systems reviewed and are otherwise negative except as noted above.  Physical Exam    Blood pressure (!) 110/48, pulse 60, temperature 98.4 F (36.9 C), temperature source Oral, resp. rate 18, height 5\' 11"  (1.803 m), weight 157 lb 14.4 oz (71.6 kg), SpO2 98 %.  General: Pleasant, older WM, NAD Psych: Normal affect. Neuro: Alert and oriented X 3. Moves all extremities spontaneously. HEENT: Normal  Neck: Supple without bruits or  JVD. Lungs:  Resp regular and unlabored, CTA. Heart: RRR no s3, s4, or murmurs. Abdomen: Soft, non-tender, non-distended, BS + x 4.  Extremities: No clubbing, cyanosis or edema. DP/PT/Radials 2+ and equal bilaterally.  Labs    Troponin [facility] of Care Test)  Recent Labs  01/05/17 2312  TROPIPOC 0.06    Recent Labs  01/06/17 0056 01/06/17 0842 01/06/17 1402  TROPONINI 0.05* 0.06* 0.06*   Lab Results  Component Value Date   WBC 7.0 01/05/2017   HGB 12.7 (L) 01/05/2017   HCT 39.2 01/05/2017   MCV 90.1 01/05/2017   PLT 149 (L) 01/05/2017    Recent Labs Lab 01/05/17 2028  NA 133*  K 3.6  CL 100*  CO2 25  BUN 38*  CREATININE 1.90*  CALCIUM 8.6*  GLUCOSE 154*   No results found for: CHOL, HDL, LDLCALC, TRIG No results found for: [facility]   Radiology Studies    Dg Chest 2 View  Result Date: 01/05/2017 CLINICAL DATA:  Syncopal episode at dinner.  History of dementia. EXAM: CHEST  2 VIEW COMPARISON:  Chest radiograph April 13, 2016 FINDINGS: Cardiac silhouette is moderately enlarged and unchanged. Mediastinal silhouette is nonsuspicious, calcified aortic knob. Chronic interstitial changes. Strandy densities bilateral lung bases. No pleural effusion. No pneumothorax. Surgical clips project in RIGHT chest wall. Soft tissue planes and included osseous structures are nonacute. Bold mild L1 compression fracture. IMPRESSION: Stable cardiomegaly. Mild chronic interstitial changes with minimal bibasilar atelectasis/ scarring. Aortic Atherosclerosis (ICD10-I70.0). Electronically Signed   By: Elon Alas M.D.   On: 01/05/2017 22:29   Ct Head Wo Contrast  Result Date: 01/06/2017 CLINICAL DATA:  83 year old male with syncopal episode. History of dementia and stroke. EXAM: CT HEAD WITHOUT CONTRAST TECHNIQUE: Contiguous axial images were obtained from the base of the skull through the vertex without intravenous contrast. COMPARISON:  None. FINDINGS: Brain: There is moderate diffuse  brain atrophy with prominent involvement of the frontal lobes. Mild periventricular and deep white matter chronic microvascular ischemic changes noted. There is no acute intracranial hemorrhage. No mass effect or midline shift. Dilated extra-axial spaces with CSF density over the frontal lobes represent prominence of the subdural spaces due to volume loss, however small old hygroma are not excluded. Vascular: No hyperdense vessel or unexpected calcification. Skull: No acute calvarial pathology. A subcentimeter ovoid lesion in the right frontal bone superior to the right orbit noted which is not well characterized. There is slight remodeling of the anterior right orbital roof indicative of chronic etiology. Direct comparison with prior CT images, if available recommended. Nonemergent MRI may provide better characterization if no prior images are available. Sinuses/Orbits: Mild diffuse mucoperiosteal thickening of paranasal sinuses. No air-fluid levels.  The mastoid air cells are clear. Other: None IMPRESSION: 1. No acute intracranial hemorrhage. 2. Moderate age-related atrophy and chronic microvascular ischemic changes. 3. Dilated extra-axial spaces secondary to volume loss versus less likely old hygroma. 4. Lucent lesion in the right frontal calvarium, chronic appearing. Direct comparison with prior images if available recommended. Electronically Signed   By: Anner Crete M.D.   On: 01/06/2017 02:27    ECG & Cardiac Imaging    EKG: SR with RBBB and 1st degree AVB  Echo: 8/18  Study Conclusions  - Left ventricle: The cavity size was normal. Wall thickness was   increased in a pattern of moderate LVH. There was focal basal   hypertrophy. Doppler parameters are consistent with abnormal left   ventricular relaxation (grade 1 diastolic dysfunction). - Aortic valve: There was mild regurgitation. - Mitral valve: There was mild to moderate regurgitation. - Left atrium: The atrium was moderately  dilated. - Pulmonic valve: There was moderate regurgitation. - Pulmonary arteries: Systolic pressure was mildly increased. PA   peak pressure: 32 mm Hg (S).  Assessment & Plan    83 yo male with PMH of SB with RBBB, CAD?, Dementia, hypothyroidism, and Stroke who presented with syncope and mildly elevated troponin.   1. Syncope: He denies any symptoms prior to this episode. Last echo stable. No chest pain. He was orthostatic on admission and treated with IVFs. No arrhyhtmias noted on telemetry. Suspect this is 2/2 to dehydration as his Cr was 1.9 on admission, baseline appears around 1.4.   2. Elevated Troponin: Past troponins noted with the same readings of 0.05. No chest pain. Seen in the past for the same, and no planned cardiac work up given his dementia. No chest pain per patient report.   3. Dementia: He is alert to self and surroundings. Appears to be at baseline in review of notes.    Barnet Pall, NP-C Pager [phone] 01/06/2017, 4:52 PM

## 2017-01-06 NOTE — ED Notes (Signed)
Paged [facility] 

## 2017-01-07 LAB — CBC WITH DIFFERENTIAL/PLATELET
BASOS ABS: 0 10*3/uL (ref 0.0–0.1)
Basophils Relative: 0 %
Eosinophils Absolute: 0.4 10*3/uL (ref 0.0–0.7)
Eosinophils Relative: 7 %
HEMATOCRIT: 37.2 % — AB (ref 39.0–52.0)
HEMOGLOBIN: 12.1 g/dL — AB (ref 13.0–17.0)
LYMPHS PCT: 18 %
Lymphs Abs: 1.1 10*3/uL (ref 0.7–4.0)
MCH: 29.3 pg (ref 26.0–34.0)
MCHC: 32.5 g/dL (ref 30.0–36.0)
MCV: 90.1 fL (ref 78.0–100.0)
MONO ABS: 0.5 10*3/uL (ref 0.1–1.0)
Monocytes Relative: 9 %
NEUTROS ABS: 4.1 10*3/uL (ref 1.7–7.7)
NEUTROS PCT: 66 %
Platelets: 143 10*3/uL — ABNORMAL LOW (ref 150–400)
RBC: 4.13 MIL/uL — ABNORMAL LOW (ref 4.22–5.81)
RDW: 14.7 % (ref 11.5–15.5)
WBC: 6.1 10*3/uL (ref 4.0–10.5)

## 2017-01-07 LAB — BASIC METABOLIC PANEL
ANION GAP: 9 (ref 5–15)
BUN: 24 mg/dL — ABNORMAL HIGH (ref 6–20)
CO2: 23 mmol/L (ref 22–32)
Calcium: 8.4 mg/dL — ABNORMAL LOW (ref 8.9–10.3)
Chloride: 103 mmol/L (ref 101–111)
Creatinine, Ser: 1.45 mg/dL — ABNORMAL HIGH (ref 0.61–1.24)
GFR calc Af Amer: 48 mL/min — ABNORMAL LOW (ref >60)
GFR, EST NON AFRICAN AMERICAN: 42 mL/min — AB (ref >60)
GLUCOSE: 99 mg/dL (ref 65–99)
POTASSIUM: 3.4 mmol/L — AB (ref 3.5–5.1)
Sodium: 135 mmol/L (ref 135–145)

## 2017-01-07 NOTE — Evaluation (Signed)
Physical Therapy Evaluation Patient Details Name: [name] [name] MRN: [id number] DOB: [birth year] Today's Date: 01/07/2017   History of Present Illness  [name] [name] is a 83 y.o. male with medical history significant of HTN, dementia, grade 1 diastolic CHF.  Patient presents to the ED after a syncopal episode that occurred while eating dinner 01/05/17.  Family heard a thud, patients head was on table and he had shaking in upper and lower extremities.  Clinical Impression  Pt ready to go home.  Pt walked in the halls with min guard for safety and vitals stable.  Pt has HEP that he does at home.  Pt knows to weigh daily.  All education complete - with pt and supportive daughter    Follow Up Recommendations No PT follow up    Equipment Recommendations  None recommended by PT    Recommendations for Other Services       Precautions / Restrictions Precautions Precautions: Fall Precaution Comments: pt and daughter denies history of falls Restrictions Weight Bearing Restrictions: No      Mobility  Bed Mobility Overal bed mobility: Needs Assistance Bed Mobility: Supine to Sit     Supine to sit: Supervision     General bed mobility comments: supervision for safety  Transfers Overall transfer level: Needs assistance Equipment used: None Transfers: Sit to/from Stand Sit to Stand: Min guard         General transfer comment: pt did well turning and backing all the way up prior to sitting - even without verbal cues  Ambulation/Gait Ambulation/Gait assistance: Min guard Ambulation Distance (Feet): 200 Feet Assistive device: None   Gait velocity: Pt did marching and walking with head turns - pt with min assist for this for safety.  I checked pt pulse 70 bpm at rest pulse ox 98 and after walking 200 feet with 3/4 dyspnea - pt pusle 76bpm and pulse ox 98%   General Gait Details: pt tends to walk with wide base and legs ER.  pt keeps weight on heels and trunk moves side to side  when walking  Stairs            Wheelchair Mobility    Modified Rankin (Stroke Patients Only)       Balance Overall balance assessment: Needs assistance Sitting-balance support: No upper extremity supported;Feet supported Sitting balance-Leahy Scale: Good     Standing balance support: No upper extremity supported;During functional activity Standing balance-Leahy Scale: Fair                               Pertinent Vitals/Pain Pain Assessment: No/denies pain    Home Living Family/patient expects to be discharged to:: Private residence Living Arrangements: Children Available Help at Discharge: Family;Available 24 hours/day;Personal care attendant Type of Home: House Home Access: Stairs to enter Entrance Stairs-Rails: Right Entrance Stairs-Number of Steps: 3 Home Layout: One level Home Equipment: None      Prior Function Level of Independence: Independent         Comments: has caregiver that comes from the [place] [facility] 5 days a week     Hand Dominance   Dominant Hand: Right    Extremity/Trunk Assessment   Upper Extremity Assessment Upper Extremity Assessment: Generalized weakness    Lower Extremity Assessment Lower Extremity Assessment: Generalized weakness    Cervical / Trunk Assessment Cervical / Trunk Assessment: Kyphotic  Communication   Communication: No difficulties  Cognition Arousal/Alertness: Awake/alert Behavior During Therapy:  WFL for tasks assessed/performed Overall Cognitive Status: History of cognitive impairments - at baseline                                 General Comments: pt denies dizziness.  Pts daughter present and supportive      General Comments General comments (skin integrity, edema, etc.): pt has had HH in the past.  the PT left written exercises for pt and pt does them daily with the caregiver    Exercises Other Exercises Other Exercises: I had pt do heelcord stretches at the sink. Other  Exercises: Pt did 20 knee bends while holding onto the sink - this is one of his home exercises   Assessment/Plan    PT Assessment Patent does not need any further PT services  PT Problem List         PT Treatment Interventions      PT Goals (Current goals can be found in the Care Plan section)  Acute Rehab PT Goals Patient Stated Goal: to get home PT Goal Formulation: With patient/family Time For Goal Achievement: 01/12/2017 Potential to Achieve Goals: Good    Frequency     Barriers to discharge        Co-evaluation               AM-PAC PT "6 Clicks" Daily Activity  Outcome Measure Difficulty turning over in bed (including adjusting bedclothes, sheets and blankets)?: A Little Difficulty moving from lying on back to sitting on the side of the bed? : A Little Difficulty sitting down on and standing up from a chair with arms (e.g., wheelchair, bedside commode, etc,.)?: A Little Help needed moving to and from a bed to chair (including a wheelchair)?: A Little Help needed walking in hospital room?: A Little Help needed climbing 3-5 steps with a railing? : A Lot 6 Click Score: 17    End of Session   Activity Tolerance: Patient tolerated treatment well Patient left: in bed;with call bell/phone within reach;with family/visitor present Nurse Communication: Mobility status      Time: 1435-1450 PT Time Calculation (min) (ACUTE ONLY): 15 min   Charges:   PT Evaluation $PT Eval Low Complexity: 1 Low PT Treatments $Gait Training: 8-22 mins   PT G Codes:        01-12-2017   Rande Lawman, PT   Loyal Buba 2017/01/12, 3:14 PM

## 2017-01-07 NOTE — Progress Notes (Signed)
Occupational Therapy Evaluation and Discharge Patient Details Name: [name] [name] MRN: [id number] DOB: 02/08/1928 Today's Date: 01/07/2017    History of Present Illness [name] [name] is a 83 y.o. male with medical history significant of HTN, dementia, grade 1 diastolic CHF.  Patient presents to the ED after a syncopal episode that occurred while eating dinner 01/05/17.  Family heard a thud, patients head was on table and he had shaking in upper and lower extremities.   Clinical Impression   Pt has PCA that comes 3 hours 5x a week and has him on shower schedule, and assists with ADL. He mobilizes without the use of AD. Pt is currently at baseline for ADL and functional transfers. Min A for ADL and min A progressing to min guard for transfers. Talked with daughter and Pt extensively and they had no questions or concerns for OT and feel that PCA is good at encouraging Pt to be independent. OT did suggest thinking about getting a shower chair for the Pt since he had a syncopal episode and shower is high fall risk area. Pt's daughter said they will look into getting one through the [place] and do not wish to pursue at this time. Since Pt is at baseline, education complete. OT to sign off at this time. Thank you for the opportunity to serve this patient.     Follow Up Recommendations  Supervision/Assistance - 24 hour    Equipment Recommendations  None recommended by OT    Recommendations for Other Services       Precautions / Restrictions Precautions Precautions: Fall Restrictions Weight Bearing Restrictions: No      Mobility Bed Mobility Overal bed mobility: Needs Assistance Bed Mobility: Supine to Sit     Supine to sit: Supervision     General bed mobility comments: supervision for safety  Transfers Overall transfer level: Needs assistance Equipment used: None Transfers: Sit to/from Stand Sit to Stand: Min assist;Min guard         General transfer comment: min A initially,  able to progress to min guard for safety (momentum used to assist)    Balance Overall balance assessment: Needs assistance Sitting-balance support: No upper extremity supported;Feet supported Sitting balance-Leahy Scale: Good     Standing balance support: No upper extremity supported;During functional activity Standing balance-Leahy Scale: Fair                             ADL either performed or assessed with clinical judgement   ADL Overall ADL's : At baseline                                       General ADL Comments: Pt able to dress himself with min A, walk and stand at sink for sink level grooming, wears depends at baseline     Vision Baseline Vision/History: Wears glasses Wears Glasses: At all times Patient Visual Report: No change from baseline       Perception     Praxis      Pertinent Vitals/Pain Pain Assessment: No/denies pain     Hand Dominance Right   Extremity/Trunk Assessment Upper Extremity Assessment Upper Extremity Assessment: Generalized weakness;Overall [facility] for tasks assessed   Lower Extremity Assessment Lower Extremity Assessment: Defer to PT evaluation   Cervical / Trunk Assessment Cervical / Trunk Assessment: Kyphotic   Communication Communication Communication: No  difficulties   Cognition Arousal/Alertness: Awake/alert Behavior During Therapy: WFL for tasks assessed/performed Overall Cognitive Status: History of cognitive impairments - at baseline                                     General Comments  Pt's daughter present during evaluation and able to assist with background and independence levels    Exercises     Shoulder Instructions      Home Living Family/patient expects to be discharged to:: Private residence Living Arrangements: Children Available Help at Discharge: Family;Available 24 hours/day;Personal care attendant Type of Home: House Home Access: Stairs to enter [employer] of Steps: 3 Entrance Stairs-Rails: Right Home Layout: One level     Bathroom Shower/Tub: Teacher, early years/pre: Standard     Home Equipment: (non-slip mat in tub)          Prior Functioning/Environment Level of Independence: Independent with assistive device(s)        Comments: has caregiver that comes from the [place] [facility] 5 days a week        OT Problem List: Impaired balance (sitting and/or standing)      OT Treatment/Interventions:      OT Goals(Current goals can be found in the care plan section) Acute Rehab OT Goals Patient Stated Goal: to get home OT Goal Formulation: With patient/family Time For Goal Achievement: 01/21/17 Potential to Achieve Goals: Good  OT Frequency:     Barriers to D/C:            Co-evaluation              AM-PAC PT "6 Clicks" Daily Activity     Outcome Measure Help from another person eating meals?: None Help from another person taking care of personal grooming?: None Help from another person toileting, which includes using toliet, bedpan, or urinal?: A Little Help from another person bathing (including washing, rinsing, drying)?: A Little Help from another person to put on and taking off regular upper body clothing?: A Little Help from another person to put on and taking off regular lower body clothing?: A Little 6 Click Score: 20   End of Session Nurse Communication: Mobility status;Other (comment)(dressed fully, tele disconnected)  Activity Tolerance: Patient tolerated treatment well Patient left: Other (comment)(walking in hall with PT)  OT Visit Diagnosis: Unsteadiness on feet (R26.81);History of falling (Z91.81)                Time: 7412-8786 OT Time Calculation (min): 20 min Charges:  OT General Charges $OT Visit: 1 Visit OT Evaluation $OT Eval Moderate Complexity: 1 Mod G-Codes:     Hulda Humphrey OTR/L [place] 01/07/2017, 2:43 PM

## 2017-01-07 NOTE — Progress Notes (Signed)
PT Cancellation Note  Patient Details Name: [name] [name] MRN: [id number] DOB: Jul 19, 1927   Cancelled Treatment:     Hold therapy eval today - troponins trending up.  Will wait until resolved for PT eval.  Will follow.   Loyal Buba 01/07/2017, 11:21 AM

## 2017-01-07 NOTE — Progress Notes (Signed)
Staff has had several encounters with patients daughter who becomes verbally aggressive and feels as though staff are not keeping her updated, requiring security to intervene on 11/3. The family member is at the bedside now, all questions and concerns have been addressed. Daughter continues to state it is "obvious to [her] that [she is] bothering staff members"

## 2017-01-07 NOTE — Progress Notes (Signed)
Patient was discharged to home, AVS reviewed with patient and daughter, no new prescriptions, instructed to only take furosemide QID as instructed by MD and AVS was corrected. IV removed, tele box returned. Patient left floor via wheelchair escorted by staff

## 2017-01-08 ENCOUNTER — Encounter: Payer: Self-pay | Admitting: Internal Medicine

## 2017-01-09 NOTE — Discharge Summary (Signed)
Discharge Summary  [name] [name] FBP:[id number] DOB: September 04, 1927  PCP: Tammi Sou, MD  Admit date: 01/05/2017 Discharge date: 01/07/2017  Time spent: >62mins  Recommendations for Outpatient Follow-up:  1. PCP  Discharge Diagnoses:  Active Hospital Problems   Diagnosis Date Noted  . Syncope 01/06/2017  . CKD (chronic kidney disease) stage 3, GFR 30-59 ml/min (HCC) 01/06/2017  . Elevated troponin     Resolved Hospital Problems  No resolved problems to display.    Discharge Condition: Stable  Diet recommendation: Heart healthy  Vitals:   01/07/17 0457 01/07/17 0930  BP: 130/82 134/87  Pulse: 62 60  Resp: 20 20  Temp: 99.2 F (37.3 C) 99.1 F (37.3 C)  SpO2: 98% 98%    History of present illness:  83 year old male with past medical history significant for hypertension, dementia, hypothyroidism, grade 1 diastolic CHF presents to the ED after a witnessed syncopal episode that occurred while patient was eating dinner.  Patient lives with her daughter due to his dementia.  As per the daughter, patient was unresponsive for about 5-10 minutes.  Daughter also noted patient's has had decreased p.o. intake over the past 2 weeks with also dyspnea on exertion.  Patient was noted to be orthostatic, with mildly elevated troponin in the ED. patient admitted for further management  Patient stable and ready to be discharged   Hospital Course:  Principal Problem:   Syncope Active Problems:   CKD (chronic kidney disease) stage 3, GFR 30-59 ml/min (HCC)   Elevated troponin  #Syncope Resolved, no further episodes Likely due to orthostatics, responded to IV fluids No arrhythmias noted on telemetry Echo done 8/24 showed diastolic dysfunction grade 1 CT head showed no mass or hemorrhage Cardiology consulted, no further recs Stay hydrated, decrease lasix to 40mg  daily  #Elevated troponin Denies any chest pain EKG negative for ischemia Past troponins noted with the same  regions of around 0.05 Seen in the past, no planned cardiac workup given his history of dementia Cardiology consulted, no further recs  #??AK I Resolved Creatinine was 1.9, baseline around 1.4  #hypothyroidism Continue Synthroid  #Dementia At baseline    Procedures:  None  Consultations:  Cardiology   Discharge Exam: BP 134/87 (BP Location: Left Arm)   Pulse 60   Temp 99.1 F (37.3 C) (Oral)   Resp 20   Ht 5\' 11"  (1.803 m)   Wt 71.4 kg (157 lb 8 oz)   SpO2 98%   BMI 21.97 kg/m   General: Alert, awake  Cardiovascular: S1-S2 heard, no added hrt sound Respiratory: Chest clear bilaterally  Discharge Instructions You were cared for by a hospitalist during your hospital stay. If you have any questions about your discharge medications or the care you received while you were in the hospital after you are discharged, you can call the unit and asked to speak with the hospitalist on call if the hospitalist that took care of you is not available. Once you are discharged, your primary care physician will handle any further medical issues. Please note that NO REFILLS for any discharge medications will be authorized once you are discharged, as it is imperative that you return to your primary care physician (or establish a relationship with a primary care physician if you do not have one) for your aftercare needs so that they can reassess your need for medications and monitor your lab values.  Discharge Instructions    Diet - low sodium heart healthy   Complete by:  As directed  Increase activity slowly   Complete by:  As directed      Allergies as of 01/07/2017      Reactions   Penicillins Hives, Itching, Swelling, Rash      Penicillins Rash      Medication List    TAKE these medications   cholecalciferol 1000 units tablet Commonly known as:  VITAMIN D Take 1,000 Units by mouth daily.   escitalopram 10 MG tablet Commonly known as:  LEXAPRO Take 10 mg by mouth  daily.   furosemide 40 MG tablet Commonly known as:  LASIX Take 40 mg by mouth 2 (two) times daily.   levothyroxine 75 MCG tablet Commonly known as:  SYNTHROID, LEVOTHROID Take 75 mcg by mouth daily before breakfast.   potassium chloride 10 MEQ tablet Commonly known as:  K-DUR Take 10 mEq by mouth daily.   QUEtiapine 100 MG tablet Commonly known as:  SEROQUEL Take 100 mg by mouth 2 (two) times daily.      Allergies  Allergen Reactions  . Penicillins Hives, Itching, Swelling and Rash       . Penicillins Rash   Follow-up Information    McGowen, Adrian Blackwater, MD Follow up.   Specialty:  Family Medicine Contact information: [id number] [place]  [phone]            The results of significant diagnostics from this hospitalization (including imaging, microbiology, ancillary and laboratory) are listed below for reference.    Significant Diagnostic Studies: Dg Chest 2 View  Result Date: 01/05/2017 CLINICAL DATA:  Syncopal episode at dinner.  History of dementia. EXAM: CHEST  2 VIEW COMPARISON:  Chest radiograph April 13, 2016 FINDINGS: Cardiac silhouette is moderately enlarged and unchanged. Mediastinal silhouette is nonsuspicious, calcified aortic knob. Chronic interstitial changes. Strandy densities bilateral lung bases. No pleural effusion. No pneumothorax. Surgical clips project in RIGHT chest wall. Soft tissue planes and included osseous structures are nonacute. Bold mild L1 compression fracture. IMPRESSION: Stable cardiomegaly. Mild chronic interstitial changes with minimal bibasilar atelectasis/ scarring. Aortic Atherosclerosis (ICD10-I70.0). Electronically Signed   By: Elon Alas M.D.   On: 01/05/2017 22:29   Ct Head Wo Contrast  Result Date: 01/06/2017 CLINICAL DATA:  83 year old male with syncopal episode. History of dementia and stroke. EXAM: CT HEAD WITHOUT CONTRAST TECHNIQUE: Contiguous axial images were obtained from the base of  the skull through the vertex without intravenous contrast. COMPARISON:  None. FINDINGS: Brain: There is moderate diffuse brain atrophy with prominent involvement of the frontal lobes. Mild periventricular and deep white matter chronic microvascular ischemic changes noted. There is no acute intracranial hemorrhage. No mass effect or midline shift. Dilated extra-axial spaces with CSF density over the frontal lobes represent prominence of the subdural spaces due to volume loss, however small old hygroma are not excluded. Vascular: No hyperdense vessel or unexpected calcification. Skull: No acute calvarial pathology. A subcentimeter ovoid lesion in the right frontal bone superior to the right orbit noted which is not well characterized. There is slight remodeling of the anterior right orbital roof indicative of chronic etiology. Direct comparison with prior CT images, if available recommended. Nonemergent MRI may provide better characterization if no prior images are available. Sinuses/Orbits: Mild diffuse mucoperiosteal thickening of paranasal sinuses. No air-fluid levels. The mastoid air cells are clear. Other: None IMPRESSION: 1. No acute intracranial hemorrhage. 2. Moderate age-related atrophy and chronic microvascular ischemic changes. 3. Dilated extra-axial spaces secondary to volume loss versus less likely old hygroma. 4. Lucent lesion  in the right frontal calvarium, chronic appearing. Direct comparison with prior images if available recommended. Electronically Signed   By: Anner Crete M.D.   On: 01/06/2017 02:27    Microbiology: No results found for this or any previous visit (from the past 240 hour(s)).   Labs: Basic Metabolic Panel: Recent Labs  Lab 01/05/17 2028 01/07/17 0308  NA 133* 135  K 3.6 3.4*  CL 100* 103  CO2 25 23  GLUCOSE 154* 99  BUN 38* 24*  CREATININE 1.90* 1.45*  CALCIUM 8.6* 8.4*   Liver Function Tests: No results for input(s): AST, ALT, ALKPHOS, BILITOT, PROT,  ALBUMIN in the last 168 hours. No results for input(s): LIPASE, AMYLASE in the last 168 hours. No results for input(s): AMMONIA in the last 168 hours. CBC: Recent Labs  Lab 01/05/17 2028 01/07/17 0308  WBC 7.0 6.1  NEUTROABS  --  4.1  HGB 12.7* 12.1*  HCT 39.2 37.2*  MCV 90.1 90.1  PLT 149* 143*   Cardiac Enzymes: Recent Labs  Lab 01/06/17 0056 01/06/17 0842 01/06/17 1402  TROPONINI 0.05* 0.06* 0.06*   BNP: BNP (last 3 results) Recent Labs    04/14/16 1059 08/08/16 1655 01/05/17 2254  BNP 582.8* 486.3* 202.3*    ProBNP (last 3 results) No results for input(s): PROBNP in the last 8760 hours.  CBG: Recent Labs  Lab 01/05/17 2018 01/06/17 0516  GLUCAP 167* 100*       Signed:  Alma Friendly, MD [facility] 01/09/2017, 6:51 PM

## 2017-01-10 ENCOUNTER — Ambulatory Visit (INDEPENDENT_AMBULATORY_CARE_PROVIDER_SITE_OTHER): Payer: Medicare Other | Admitting: Internal Medicine

## 2017-01-10 ENCOUNTER — Encounter: Payer: Self-pay | Admitting: Internal Medicine

## 2017-01-10 VITALS — BP 126/72 | HR 68 | Temp 97.2°F | Resp 16 | Ht 66.5 in | Wt 166.0 lb

## 2017-01-10 DIAGNOSIS — E860 Dehydration: Secondary | ICD-10-CM | POA: Diagnosis not present

## 2017-01-10 DIAGNOSIS — R748 Abnormal levels of other serum enzymes: Secondary | ICD-10-CM

## 2017-01-10 DIAGNOSIS — R55 Syncope and collapse: Secondary | ICD-10-CM | POA: Diagnosis not present

## 2017-01-10 DIAGNOSIS — F0280 Dementia in other diseases classified elsewhere without behavioral disturbance: Secondary | ICD-10-CM | POA: Diagnosis not present

## 2017-01-10 DIAGNOSIS — R778 Other specified abnormalities of plasma proteins: Secondary | ICD-10-CM

## 2017-01-10 DIAGNOSIS — N183 Chronic kidney disease, stage 3 unspecified: Secondary | ICD-10-CM

## 2017-01-10 DIAGNOSIS — E039 Hypothyroidism, unspecified: Secondary | ICD-10-CM

## 2017-01-10 DIAGNOSIS — Z79899 Other long term (current) drug therapy: Secondary | ICD-10-CM | POA: Diagnosis not present

## 2017-01-10 DIAGNOSIS — R7989 Other specified abnormal findings of blood chemistry: Secondary | ICD-10-CM

## 2017-01-10 DIAGNOSIS — G301 Alzheimer's disease with late onset: Secondary | ICD-10-CM

## 2017-01-10 LAB — CBC WITH DIFFERENTIAL/PLATELET
Basophils Absolute: 22 cells/uL (ref 0–200)
Basophils Relative: 0.3 %
EOS PCT: 9.8 %
Eosinophils Absolute: 715 cells/uL — ABNORMAL HIGH (ref 15–500)
HEMATOCRIT: 40.6 % (ref 38.5–50.0)
HEMOGLOBIN: 13.2 g/dL (ref 13.2–17.1)
LYMPHS ABS: 854 {cells}/uL (ref 850–3900)
MCH: 28.8 pg (ref 27.0–33.0)
MCHC: 32.5 g/dL (ref 32.0–36.0)
MCV: 88.5 fL (ref 80.0–100.0)
MPV: 11.3 fL (ref 7.5–12.5)
Monocytes Relative: 11.3 %
NEUTROS ABS: 4884 {cells}/uL (ref 1500–7800)
Neutrophils Relative %: 66.9 %
Platelets: 193 10*3/uL (ref 140–400)
RBC: 4.59 10*6/uL (ref 4.20–5.80)
RDW: 13.4 % (ref 11.0–15.0)
Total Lymphocyte: 11.7 %
WBC mixed population: 825 cells/uL (ref 200–950)
WBC: 7.3 10*3/uL (ref 3.8–10.8)

## 2017-01-10 LAB — BASIC METABOLIC PANEL WITH GFR
BUN/Creatinine Ratio: 17 (calc) (ref 6–22)
BUN: 24 mg/dL (ref 7–25)
CALCIUM: 9.4 mg/dL (ref 8.6–10.3)
CO2: 27 mmol/L (ref 20–32)
CREATININE: 1.42 mg/dL — AB (ref 0.70–1.11)
Chloride: 102 mmol/L (ref 98–110)
GFR, EST NON AFRICAN AMERICAN: 44 mL/min/{1.73_m2} — AB (ref >=60)
GFR, Est African American: 51 mL/min/{1.73_m2} — ABNORMAL LOW (ref >=60)
Glucose, Bld: 95 mg/dL (ref 65–99)
POTASSIUM: 4.2 mmol/L (ref 3.5–5.3)
Sodium: 137 mmol/L (ref 135–146)

## 2017-01-10 MED ORDER — VITAMIN D-3 125 MCG (5000 UT) PO TABS: 5000.0000 [IU] | ORAL_TABLET | Freq: Every morning | ORAL | Status: AC

## 2017-01-10 NOTE — Patient Instructions (Signed)

## 2017-01-10 NOTE — Progress Notes (Signed)
[place]     This very nice 83 y.o. WWM was admitted 11/2-06/2016 and presents for post hospital follow up for Syncope w/u, Dehydration, elevated Troponins, CKD3, Hypothyroidism and Dementia. During hospitalization, patient was found dehydrated w/(+) orthostatics consistent with labs revealing AKI of dehydration and he improved with IVF rehydration. Head CT showed age related atrophy and no other obvious problems. Cardiac echo showed Grade I diastolic dysfunction. Troponins were mildly elevated consistent with levels at prior hospitalization. Cardiology w/u was deferred due to his moderate Dementia.  Patient has no recall of the hospitalization or coming home 2 days ago. Patient' s caretaker daughter  was contacted post discharge by office staff to assure stability and schedule f/u.      Hospitalization discharge instructions and medications are reconciled with the patient and his daughter Beverlee Nims)       Patient is also followed with Hypertension, Hyperlipidemia, Pre-Diabetes, Depression, Dementia  and Vitamin D Deficiency.      Patient is treated for HTN circa 1982 & has known CKD 3  & BP has been controlled at home. Today's BP is at goal - 126/72.   Patient was hospitalized Dec 2017 with combined R/L CHF and aggressive treatment was deferred in consideration of mis moderate Dementia. Patient has had no complaints of any cardiac type chest pain, palpitations, dyspnea/orthopnea/PND, dizziness, claudication, or dependent edema.     Hyperlipidemia is controlled with diet & meds. Patient denies myalgias or other med SE's. Last Lipids were  Lab Results  Component Value Date   CHOL 203 (H) 11/10/2016   HDL 42 11/10/2016   LDLCALC 101 (H) 04/19/2016   TRIG 188 (H) 11/10/2016   CHOLHDL 4.8 11/10/2016      Also, the patient has history of PreDiabetes with A1c 5.8% in 2011 and has had no symptoms of reactive hypoglycemia, diabetic polys, paresthesias or visual blurring.  Last A1c after  weight loss was normal and at goal: Lab Results  Component Value Date   HGBA1C 5.4 11/10/2016      Further, the patient also has history of Vitamin D Deficiency ("25" in 2008) and supplements vitamin D without any suspected side-effects. Last vitamin D was   Lab Results  Component Value Date   VD25OH 47 11/10/2016   Current Outpatient Medications on File Prior to Visit  Medication Sig  . aspirin EC 81 MG tablet Take 81 mg by mouth daily.  . cholecalciferol (VITAMIN D) 1000 UNITS tablet Take 2,000 Units by mouth every morning.   . escitalopram (LEXAPRO) 10 MG tablet Take 10 mg by mouth every morning.   . furosemide (LASIX) 40 MG tablet Take 2 tablets daily for fluid and BP.  Marland Kitchen levothyroxine (SYNTHROID, LEVOTHROID) 75 MCG tablet Take 1 tablet (75 mcg total) by mouth daily.  . Omega-3 Fatty Acids (FISH OIL PO) Take 1 capsule by mouth daily.  . potassium chloride (K-DUR) 10 MEQ tablet Take 1 tablet (10 mEq total) by mouth daily.  . QUEtiapine (SEROQUEL) 100 MG tablet Take 100 mg by mouth 2 (two) times daily.   No current facility-administered medications on file prior to visit.    Allergies  Allergen Reactions  . Penicillins Hives, Itching, Swelling and Rash       . Penicillins Rash   PMHx:   Past Medical History:  Diagnosis Date  . Bladder cancer ([place])   . Dementia   . Depression   . HTN (hypertension)    off medications  . HTN (hypertension)   .  Hypothyroid   . Hypothyroidism   . Other testicular hypofunction   . Prediabetes   . Prostate cancer ([place])   . Stroke ([place]) 1980  . Stroke ([place])   . Vitamin D deficiency    Immunization History  Administered Date(s) Administered  . Influenza Split 12/18/2012  . Influenza, High Dose Seasonal PF 02/22/2015, 01/04/2017  . Influenza,inj,quad, With Preservative 01/12/2016  . Pneumococcal Conjugate-13 02/22/2015  . Pneumococcal Polysaccharide-23 03/06/2006  . Td 03/06/2004   Past Surgical History:  Procedure Laterality Date    . BIOPSY PROSTATE  2/99   pos for adenocarcinoma of prostate  . BLADDER SURGERY    . CYSTOURETHROSCOPY  7/99   and TUR of bladder cancer  . PROSTATE SURGERY  2007  . PROSTATE SURGERY     FHx:    Reviewed / unchanged  SHx:    Reviewed / unchanged  Systems Review:  Constitutional: Denies fever, chills, wt changes, headaches, insomnia, fatigue, night sweats, change in appetite. Eyes: Denies redness, blurred vision, diplopia, discharge, itchy, watery eyes.  ENT: Denies discharge, congestion, post nasal drip, epistaxis, sore throat, earache, hearing loss, dental pain, tinnitus, vertigo, sinus pain, snoring.  CV: Denies chest pain, palpitations, irregular heartbeat, syncope, dyspnea, diaphoresis, orthopnea, PND, claudication or edema. Respiratory: denies cough, dyspnea, DOE, pleurisy, hoarseness, laryngitis, wheezing.  Gastrointestinal: Denies dysphagia, odynophagia, heartburn, reflux, water brash, abdominal pain or cramps, nausea, vomiting, bloating, diarrhea, constipation, hematemesis, melena, hematochezia  or hemorrhoids. Genitourinary: Denies dysuria, frequency, urgency, nocturia, hesitancy, discharge, hematuria or flank pain. Musculoskeletal: Denies arthralgias, myalgias, stiffness, jt. swelling, pain, limping or strain/sprain.  Skin: Denies pruritus, rash, hives, warts, acne, eczema or change in skin lesion(s). Neuro: No weakness, tremor, incoordination, spasms, paresthesia or pain. Psychiatric: Denies confusion, memory loss or sensory loss. Endo: Denies change in weight, skin or hair change.  Heme/Lymph: No excessive bleeding, bruising or enlarged lymph nodes.  Physical Exam  BP 126/72   Pulse 68   Temp (!) 97.2 F (36.2 C)   Resp 16   Ht 5' 6.5" (1.689 m)   Wt 166 lb (75.3 kg)   BMI 26.39 kg/m    O2 sat 97%  Orthostatic BP Sitting BP 135/77 P 66   And Standing BP 137/75  P 70  Appears well nourished, well groomed  and in no distress.  Eyes: PERRLA, EOMs, conjunctiva no  swelling or erythema. Sinuses: No frontal/maxillary tenderness ENT/Mouth: EAC's clear, TM's nl w/o erythema, bulging. Nares clear w/o erythema, swelling, exudates. Oropharynx clear without erythema or exudates. Oral hygiene is good. Tongue normal, non obstructing. Hearing intact.  Neck: Supple. Thyroid nl. Car 2+/2+ without bruits, nodes or JVD. Chest: Respirations nl with BS clear & equal w/o rales, rhonchi, wheezing or stridor.  Cor: Heart sounds normal w/ regular rate and rhythm without sig. murmurs, gallops, clicks or rubs. Peripheral pulses normal and equal  without edema.  Abdomen: Soft & bowel sounds normal. Non-tender w/o guarding, rebound, hernias, masses or organomegaly.  Lymphatics: Unremarkable.  Musculoskeletal: Full ROM all peripheral extremities, joint stability, 5/5 strength and sl broad based gait using a walker.  Skin: Warm, dry without exposed rashes, lesions or ecchymosis apparent.  Neuro: Cranial nerves intact, reflexes equal bilaterally. Sensory-motor testing grossly intact. Tendon reflexes flat. (+) Snout & palmo-mental responses.  Pysch: Alert & oriented x 2 (not time/date).  Insight and judgement limited. Short term recall is < 2 minutes.   Assessment and Plan:  1. Syncope  - Continue medication, monitor blood pressure at home.  -  CBC with Differential/Platelet - BASIC METABOLIC PANEL WITH GFR  2. Dehydration - advised with hold Lasix (& KCl) unless develops ankle swelling .  - CBC with Differential/Platelet - BASIC METABOLIC PANEL WITH GFR  3. Elevated troponin  - passive recognition   4. CKD (chronic kidney disease) stage 3, GFR 30-59 ml/min (HCC) - Continue monitor periodic  renal functions  - BASIC METABOLIC PANEL WITH GFR  5. Hypothyroidism  6. SDAT (senile dementia of Alzheimer's type)  - Patient has advanced Directives, Living Will and DNR   7. Medication management  - CBC with Differential/Platelet - BASIC METABOLIC PANEL WITH  GFR         Discussed  regular exercise, BP monitoring, weight control to achieve/maintain BMI less than 25 and discussed med and SE's. Recommended labs to assess and monitor clinical status with further disposition pending results of labs. Over 30 minutes of exam, counseling, chart review was performed.

## 2017-01-17 ENCOUNTER — Ambulatory Visit: Payer: Self-pay | Admitting: Internal Medicine

## 2017-01-22 ENCOUNTER — Other Ambulatory Visit: Payer: Self-pay | Admitting: Internal Medicine

## 2017-01-22 MED ORDER — MIRTAZAPINE 15 MG PO TABS: ORAL_TABLET | ORAL | 2 refills | Status: DC

## 2017-01-22 MED ORDER — MIRTAZAPINE 15 MG PO TABS: 15.0000 mg | ORAL_TABLET | Freq: Every day | ORAL | 2 refills | Status: DC

## 2017-02-06 DIAGNOSIS — C61 Malignant neoplasm of prostate: Secondary | ICD-10-CM | POA: Diagnosis not present

## 2017-02-06 DIAGNOSIS — C670 Malignant neoplasm of trigone of bladder: Secondary | ICD-10-CM | POA: Diagnosis not present

## 2017-02-13 ENCOUNTER — Ambulatory Visit: Payer: Self-pay | Admitting: Adult Health

## 2017-02-21 NOTE — Progress Notes (Signed)
MEDICARE ANNUAL WELLNESS VISIT AND FOLLOW UP Assessment:   Diagnoses and all orders for this visit:  Encounter for Medicare annual wellness exam  Labile hypertension Labile with hx of syncope; off of treatment and monitoring Monitor blood pressure at home; patient to call if consistently greater than 130/80 or if below 90/60 Continue DASH diet.   Reminder to go to the ER if any CP, SOB, nausea, dizziness, severe HA, changes vision/speech, left arm numbness and tingling and jaw pain.  Chronic combined systolic and diastolic congestive heart failure (HCC) Emphasized salt restriction, less than 2000mg  a day. Encouraged daily monitoring of the patient's weight, call office if 5 lb weight loss or gain in a day.  Encouraged regular exercise. If any increasing shortness of breath, swelling, or chest pressure go to ER immediately.   CKD (chronic kidney disease) stage 3, GFR 30-59 ml/min (HCC) Increase fluids, avoid NSAIDS, monitor sugars, will monitor -     BASIC METABOLIC PANEL WITH GFR  Thrombocytopenia (HCC) -     CBC with Differential/Platelet  Anemia due to stage 3 chronic kidney disease (HCC) Monitor expectantly; if symptomatic will refer -     CBC with Differential/Platelet  Vitamin D deficiency Continue supplementation Check vitamin D level  Other abnormal glucose Well controlled A1Cs in recent years; will defer checking Discussed disease and risks Discussed diet/exercise, weight management  -     BASIC METABOLIC PANEL WITH GFR  Pure hypercholesterolemia Not treated by statin secondary to age and advancing dementia; continue diet/supplements Continue low cholesterol diet and exercise.  -     Lipid panel  Medication management -     CBC with Differential/Platelet -     BASIC METABOLIC PANEL WITH GFR -     Hepatic function panel  Depression, major, in remission ([place]) Well controlled; continue medications - seroquel reduction appears to have improved his function   Lifestyle discussed: diet/exerise, sleep hygiene, stress management, hydration  RBBB No changes; will continue to monitor Refer cardiology as needed  Coronary artery disease involving native coronary artery of native heart without angina pectoris Control blood pressure, cholesterol, glucose, increase exercise.   Hypothyroidism, unspecified type continue medications the same reminded to take on an empty stomach 30-89mins before food.  -     TSH  SDAT (senile dementia of Alzheimer's type) Continue close monitoring of cardiovascular health; cholesterol/glucose/BP Good home situation; no wandering, falls, injuries. Monitored around the clock by daughter whom he lives with and home health aid as needed - has good routine/schedule, eating and managing ADLs with assistance well.   Overactive bladder Monitored by Dr. Gaynelle Arabian  History of prostate cancer Monitored by Dr. Gaynelle Arabian  History of bladder cancer Monitored by Dr. Gaynelle Arabian  Over 30 minutes of exam, counseling, chart review, and critical decision making was performed  Future Appointments  Date Time Provider [place]  05/30/2017 11:00 AM Unk Pinto, MD [facility] None     Plan:   During the course of the visit the patient was educated and counseled about appropriate screening and preventive services including:    Pneumococcal vaccine   Influenza vaccine  Prevnar 13  Td vaccine  Screening electrocardiogram  Colorectal cancer screening  Diabetes screening  Glaucoma screening  Nutrition counseling    Subjective:  [name] [name] is a 83 y.o. male accompanied by daughter and caregiver -  with moderate dementia presents for 3 month follow up and Medicare Annual Wellness Visit - monitored for hypertension, Combined systolic/diastolic CHF, CKD, depression in remission  with treatment, cholesterol, glucose monitoring and vitamin D deficiency. Aggressive CHF/cholesterol treatments have not been  pursued secondary to advancing dementia. He has a diagnosis of depression and is currently on lexapro 10 mg, remeron 15 mg - seroquel was reduced to 50 mg, reports symptoms are well controlled- reduced dose has improved alertness and appetite. Doing well on current regimen.   His blood pressure has been controlled at home, today their BP is BP: 126/72 He does not workout. He denies chest pain, shortness of breath, dizziness.   He is not on cholesterol medication secondary to age and dementia. His cholesterol is not at goal. The cholesterol last visit was:   Lab Results  Component Value Date   CHOL 203 (H) 11/10/2016   HDL 42 11/10/2016   LDLCALC 101 (H) 04/19/2016   TRIG 188 (H) 11/10/2016   CHOLHDL 4.8 11/10/2016   He is monitored closely for glucose abnormalities, and denies increased appetite, nausea, paresthesia of the feet, polydipsia, polyuria, visual disturbances and vomiting. Last A1C in the office was:  Lab Results  Component Value Date   HGBA1C 5.4 11/10/2016   Last GFR Lab Results  Component Value Date   GFRNONAA 44 (L) 01/10/2017    Lab Results  Component Value Date   GFRAA 51 (L) 01/10/2017   Patient is on Vitamin D supplement but remains below goal at the last check:    Lab Results  Component Value Date   VD25OH 47 11/10/2016      Medication Review: Current Outpatient Medications on File Prior to Visit  Medication Sig Dispense Refill  . aspirin EC 81 MG tablet Take 81 mg by mouth daily.    . cholecalciferol 5000 units TABS Take 5,000 Units every morning by mouth. 1 tablet   . escitalopram (LEXAPRO) 10 MG tablet Take 10 mg by mouth every morning.     Marland Kitchen levothyroxine (SYNTHROID, LEVOTHROID) 75 MCG tablet Take 1 tablet (75 mcg total) by mouth daily. 90 tablet 1  . mirtazapine (REMERON) 15 MG tablet Take 1 tablet at bedtime for Appetite 30 tablet 2  . Omega-3 Fatty Acids (FISH OIL PO) Take 1 capsule by mouth daily.    . QUEtiapine (SEROQUEL) 100 MG tablet Take 100  mg by mouth at bedtime. Takes 50mg  tablet by mouth at bedtime     No current facility-administered medications on file prior to visit.     Allergies: Allergies  Allergen Reactions  . Penicillins Hives, Itching, Swelling and Rash       . Penicillins Rash    Current Problems (verified) has RBBB; HTN (hypertension); Hypothyroid; Vitamin D deficiency; Prediabetes; Testosterone deficiency; Hyperlipidemia; Medication management; Overactive bladder; SDAT (senile dementia of Alzheimer's type); Depression, major, in remission ([place]); History of prostate cancer; History of bladder cancer; Bilateral pleural effusion; Acute respiratory failure (HCC); CKD (chronic kidney disease) stage 3, GFR 30-59 ml/min ([place]); Chronic combined systolic and diastolic congestive heart failure ([place]); Dementia without behavioral disturbance; Thrombocytopenia ([place]); Anemia; HOH (hard of hearing); Coronary artery disease; Pericardial effusion without cardiac tamponade; Syncope; and Elevated troponin on their problem list.  Screening Tests Immunization History  Administered Date(s) Administered  . Influenza Split 12/18/2012  . Influenza, High Dose Seasonal PF 02/22/2015, 01/04/2017  . Influenza,inj,quad, With Preservative 01/12/2016  . Pneumococcal Conjugate-13 02/22/2015  . Pneumococcal Polysaccharide-23 03/06/2006  . Td 03/06/2004   Preventative care: Last colonoscopy: 1998, declines another due to age Echo 62/7035, EF 00%, diastolic grade 1 Bone Scan 04/2012 CXR 2009  Prior vaccinations: TD or  Tdap: 2006 DUE declines  Influenza: 2018 Pneumococcal: 2008 Prevnar13: 2016 Shingles/Zostavax: declines  Names of Other Physician/Practitioners you currently use: 1. [facility] here for primary care 2. Jule Ser, eye doctor, went this year - 2018 managed by [place] 3. unknown, dentist, last visit remote  Patient Care Team: Unk Pinto, MD as PCP - General (Internal  Medicine) Josue Hector, MD as Consulting Physician (Cardiology) Elam Dutch, MD as Consulting Physician (Vascular Surgery) Unk Pinto, MD (Internal Medicine)  Surgical: He  has a past surgical history that includes Biopsy prostate (2/99); Cystourethroscopy (7/99); Prostate surgery (2007); Prostate surgery; and Bladder surgery. Family His family history includes Heart disease in his mother; Other in his father; Thyroid disease in his father. Social history  He reports that he quit smoking about 53 years ago. His smoking use included cigarettes. He has a 10.00 pack-year smoking history. he has never used smokeless tobacco. He reports that he does not drink alcohol or use drugs.  MEDICARE WELLNESS OBJECTIVES: Physical activity: Current Exercise Habits: Home exercise routine, Type of exercise: walking, Time (Minutes): 15, Frequency (Times/Week): 7, Weekly Exercise (Minutes/Week): 105, Intensity: Mild Cardiac risk factors: Cardiac Risk Factors include: advanced age (>38men, >69 women);male gender;dyslipidemia;hypertension;sedentary lifestyle;smoking/ tobacco exposure Depression/mood screen:   Depression screen [facility] 2/9 02/22/2017  Decreased Interest 3  Down, Depressed, Hopeless 0  PHQ - 2 Score 3  Altered sleeping 1  Tired, decreased energy 0  Change in appetite 0  Feeling bad or failure about yourself  0  Trouble concentrating 3  Moving slowly or fidgety/restless 0  Suicidal thoughts 0  PHQ-9 Score 7  Difficult doing work/chores Not difficult at all    ADLs:  In your present state of health, do you have any difficulty performing the following activities: 02/22/2017 01/10/2017  Hearing? N Y  Comment Wears hearing aids bilaterally bilat hearing aids  Vision? N N  Comment Wears glasses -  Difficulty concentrating or making decisions? Y Y  Comment Very poor short term memory dementia - moderate  Walking or climbing stairs? N Y  Comment - using a walker  Dressing or  bathing? Tempie Donning  Comment Has home health aid daily - difficulties with buttons requires direction supervision & assistance  Doing errands, shopping? Y Y  Comment Does not drive - advanced dementia daughter transports  Conservation officer, nature and eating ? Y -  Using the Toilet? N -  In the past six months, have you accidently leaked urine? Y -  Comment Wears depends - s/p prostate/bladder CA  -  Do you have problems with loss of bowel control? N -  Managing your Medications? Y -  Comment Managed by daughter -  Managing your Finances? Y -  Comment Managed by daughter -  Housekeeping or managing your Housekeeping? Y -  Comment Managed by daughter and aid -  Some recent data might be hidden     Cognitive Testing  Alert? Yes  Normal Appearance?Yes  Oriented to person? Yes  Place? No   Time? No  Recall of three objects?  No  Can perform simple calculations? No  Displays appropriate judgment?Yes  Can read the correct time from a watch face?Yes  EOL planning: Does Patient Have a Medical Advance Directive?: Yes Type of Advance Directive: Healthcare Power of Attorney, Living will Does patient want to make changes to medical advance directive?: No - Patient declined Copy of [place] in Chart?: Yes   Objective:   Today's Vitals  02/22/17 1042  BP: 126/72  Pulse: 60  Temp: (!) 97.3 F (36.3 C)  SpO2: 99%  Weight: 161 lb (73 kg)  Height: 5' 6.5" (1.689 m)   Body mass index is 25.6 kg/m.  General appearance: alert, no distress, WD/WN, male HEENT: normocephalic, sclerae anicteric, TMs pearly, nares patent, no discharge or erythema, pharynx normal, hard of hearing - wears bilateral hearing aids Oral cavity: MMM, no lesions, dentures top and bottom in good repair  Neck: supple, no lymphadenopathy, no thyromegaly, no masses Heart: RRR, normal S1, S2, no murmurs Lungs: CTA bilaterally, no wheezes, rhonchi, or rales Abdomen: +bs, soft, non tender, non distended, no masses,  no hepatomegaly, no splenomegaly Musculoskeletal: nontender, no swelling, no obvious deformity - slow shuffling gait Extremities: no edema, no cyanosis, no clubbing Pulses: 2+ symmetric, upper and lower extremities, normal cap refill Neurological: alert, oriented x 1, CN2-12 intact, strength normal upper extremities and lower extremities, sensation normal throughout, DTRs 2+ throughout, no cerebellar signs, gait slow/shuffling Psychiatric: Confused, follows commands well but does not engage in conversation, will answer direct simple questions, pleasant   Medicare Attestation I have personally reviewed: The patient's medical and social history Their use of alcohol, tobacco or illicit drugs Their current medications and supplements The patient's functional ability including ADLs,fall risks, home safety risks, cognitive, and hearing and visual impairment Diet and physical activities Evidence for depression or mood disorders  The patient's weight, height, BMI, and visual acuity have been recorded in the chart.  I have made referrals, counseling, and provided education to the patient based on review of the above and I have provided the patient with a written personalized care plan for preventive services.     Izora Ribas, NP   02/22/2017

## 2017-02-21 NOTE — Progress Notes (Deleted)
FOLLOW UP  Assessment and Plan:   Labile hypertension Labile with hx of syncope; off of treatment and monitoring Monitor blood pressure at home; patient to call if consistently greater than 130/80 or if below 90/60 Continue DASH diet.   Reminder to go to the ER if any CP, SOB, nausea, dizziness, severe HA, changes vision/speech, left arm numbness and tingling and jaw pain.  CHF Disease process and medications discussed. Questions answered fully. Emphasized salt restriction, less than 2000mg  a day. Encouraged daily monitoring of the patient's weight, call office if 5 lb weight loss or gain in a day.  Encouraged regular exercise. If any increasing shortness of breath, swelling, or chest pressure go to ER immediately.  decrease your fluid intake to less than 2 L daily please remember to always increase your potassium intake with any increase of your fluid pill.   Cholesterol Borderline control with lifestyle and omega 3 supplement; not treated with statin secondary to age and progressive dementia Continue low cholesterol diet and exercise.  Check lipid panel.   History of abnormal glucose Well controlled in recent years- defer checking A1C Discussed disease and risks Discussed diet/exercise, weight management   Depression/anxiety Well controlled; continue medications  Lifestyle discussed: diet/exerise, sleep hygiene, stress management, hydration  Vitamin D Def/ osteoporosis prevention Continue supplementation Check Vit D level  CKD stage 3 Increase fluids, avoid NSAIDS, monitor sugars, will monitor  Continue diet and meds as discussed. Further disposition pending results of labs. Discussed med's effects and SE's.   Over 30 minutes of exam, counseling, chart review, and critical decision making was performed.   Future Appointments  Date Time Provider [place]  02/22/2017 10:45 AM Liane Comber, NP [facility] None  05/30/2017 11:00 AM Unk Pinto, MD  [facility] None    ----------------------------------------------------------------------------------------------------------------------  HPI 83 y.o. male with moderate dementia presents for 3 month follow up on hypertension, Combined systolic/diastolic CHF, CKD, depression in remission with treatment, cholesterol, glucose monitoring and vitamin D deficiency. In review, it appears aggressive CHF/cholesterol treatments have not been pursued secondary to advancing dementia.   he has a diagnosis of depression and is currently on lexapro 10 mg, remeron 15 mg and seroquel 100 mg, reports symptoms are*** well controlled on current regimen.   BMI is There is no height or weight on file to calculate BMI., he {HAS HAS OIN:86767} been working on diet and exercise. Wt Readings from Last 3 Encounters:  01/10/17 166 lb (75.3 kg)  01/07/17 157 lb 8 oz (71.4 kg)  11/10/16 161 lb 6.4 oz (73.2 kg)   His blood pressure {HAS HAS NOT:18834} been controlled at home, today their BP is    He {DOES_DOES MCN:47096} workout. He denies chest pain, shortness of breath, dizziness.   He is not on cholesterol medication secondary to age, treated by lifestyle and omega 3 supplement. His cholesterol is not at goal. The cholesterol last visit was:   Lab Results  Component Value Date   CHOL 203 (H) 11/10/2016   HDL 42 11/10/2016   LDLCALC 101 (H) 04/19/2016   TRIG 188 (H) 11/10/2016   CHOLHDL 4.8 11/10/2016    He {Has/has not:18111} been working on diet and exercise for history of abnormal glucose, and denies {Symptoms; diabetes w/o none:19199}. Last A1C in the office was:  Lab Results  Component Value Date   HGBA1C 5.4 11/10/2016   Patient is on Vitamin D supplement but remained below goal at the last check:   Lab Results  Component Value Date  VD25OH 47 11/10/2016      Current Medications:  Current Outpatient Medications on File Prior to Visit  Medication Sig  . aspirin EC 81 MG tablet Take 81 mg by  mouth daily.  . cholecalciferol 5000 units TABS Take 5,000 Units every morning by mouth.  . escitalopram (LEXAPRO) 10 MG tablet Take 10 mg by mouth every morning.   Marland Kitchen levothyroxine (SYNTHROID, LEVOTHROID) 75 MCG tablet Take 1 tablet (75 mcg total) by mouth daily.  . mirtazapine (REMERON) 15 MG tablet Take 1 tablet at bedtime for Appetite  . Omega-3 Fatty Acids (FISH OIL PO) Take 1 capsule by mouth daily.  . QUEtiapine (SEROQUEL) 100 MG tablet Take 100 mg at bedtime by mouth.   No current facility-administered medications on file prior to visit.      Allergies:  Allergies  Allergen Reactions  . Penicillins Hives, Itching, Swelling and Rash       . Penicillins Rash     Medical History:  Past Medical History:  Diagnosis Date  . Bladder cancer ([place])   . Dementia   . Depression   . HTN (hypertension)    off medications  . HTN (hypertension)   . Hypothyroid   . Hypothyroidism   . Other testicular hypofunction   . Prediabetes   . Prostate cancer ([employer])   . Stroke ([place]) 1980  . Stroke ([place])   . Vitamin D deficiency    Family history- Reviewed and unchanged Social history- Reviewed and unchanged   Review of Systems:  ROS    Physical Exam: There were no vitals taken for this visit. Wt Readings from Last 3 Encounters:  01/10/17 166 lb (75.3 kg)  01/07/17 157 lb 8 oz (71.4 kg)  11/10/16 161 lb 6.4 oz (73.2 kg)   General Appearance: Well nourished, in no apparent distress. Eyes: PERRLA, EOMs, conjunctiva no swelling or erythema Sinuses: No Frontal/maxillary tenderness ENT/Mouth: Ext aud canals clear, TMs without erythema, bulging. No erythema, swelling, or exudate on post pharynx.  Tonsils not swollen or erythematous. Hearing normal.  Neck: Supple, thyroid normal.  Respiratory: Respiratory effort normal, BS equal bilaterally without rales, rhonchi, wheezing or stridor.  Cardio: RRR with no MRGs. Brisk peripheral pulses without edema.  Abdomen: Soft, + BS.  Non tender,  no guarding, rebound, hernias, masses. Lymphatics: Non tender without lymphadenopathy.  Musculoskeletal: Full ROM, 5/5 strength, {PSY - GAIT AND STATION:22860} gait Skin: Warm, dry without rashes, lesions, ecchymosis.  Neuro: Cranial nerves intact. No cerebellar symptoms.  Psych: Awake and oriented X 3, normal affect, Insight and Judgment appropriate.    Izora Ribas, NP 2:06 PM [facility]

## 2017-02-22 ENCOUNTER — Encounter: Payer: Self-pay | Admitting: Adult Health

## 2017-02-22 ENCOUNTER — Ambulatory Visit (INDEPENDENT_AMBULATORY_CARE_PROVIDER_SITE_OTHER): Payer: Medicare Other | Admitting: Adult Health

## 2017-02-22 VITALS — BP 126/72 | HR 60 | Temp 97.3°F | Ht 66.5 in | Wt 161.0 lb

## 2017-02-22 DIAGNOSIS — R7309 Other abnormal glucose: Secondary | ICD-10-CM

## 2017-02-22 DIAGNOSIS — Z8551 Personal history of malignant neoplasm of bladder: Secondary | ICD-10-CM

## 2017-02-22 DIAGNOSIS — I10 Essential (primary) hypertension: Secondary | ICD-10-CM | POA: Diagnosis not present

## 2017-02-22 DIAGNOSIS — E039 Hypothyroidism, unspecified: Secondary | ICD-10-CM

## 2017-02-22 DIAGNOSIS — Z79899 Other long term (current) drug therapy: Secondary | ICD-10-CM | POA: Diagnosis not present

## 2017-02-22 DIAGNOSIS — I4510 Unspecified right bundle-branch block: Secondary | ICD-10-CM | POA: Diagnosis not present

## 2017-02-22 DIAGNOSIS — N183 Chronic kidney disease, stage 3 unspecified: Secondary | ICD-10-CM

## 2017-02-22 DIAGNOSIS — Z0001 Encounter for general adult medical examination with abnormal findings: Secondary | ICD-10-CM

## 2017-02-22 DIAGNOSIS — I2510 Atherosclerotic heart disease of native coronary artery without angina pectoris: Secondary | ICD-10-CM

## 2017-02-22 DIAGNOSIS — I5042 Chronic combined systolic (congestive) and diastolic (congestive) heart failure: Secondary | ICD-10-CM

## 2017-02-22 DIAGNOSIS — Z0000 Encounter for general adult medical examination without abnormal findings: Secondary | ICD-10-CM

## 2017-02-22 DIAGNOSIS — R6889 Other general symptoms and signs: Secondary | ICD-10-CM | POA: Diagnosis not present

## 2017-02-22 DIAGNOSIS — N3281 Overactive bladder: Secondary | ICD-10-CM

## 2017-02-22 DIAGNOSIS — F0280 Dementia in other diseases classified elsewhere without behavioral disturbance: Secondary | ICD-10-CM

## 2017-02-22 DIAGNOSIS — D631 Anemia in chronic kidney disease: Secondary | ICD-10-CM

## 2017-02-22 DIAGNOSIS — G301 Alzheimer's disease with late onset: Secondary | ICD-10-CM

## 2017-02-22 DIAGNOSIS — F325 Major depressive disorder, single episode, in full remission: Secondary | ICD-10-CM

## 2017-02-22 DIAGNOSIS — D696 Thrombocytopenia, unspecified: Secondary | ICD-10-CM | POA: Diagnosis not present

## 2017-02-22 DIAGNOSIS — E7800 Pure hypercholesterolemia, unspecified: Secondary | ICD-10-CM

## 2017-02-22 DIAGNOSIS — Z8546 Personal history of malignant neoplasm of prostate: Secondary | ICD-10-CM

## 2017-02-22 DIAGNOSIS — E559 Vitamin D deficiency, unspecified: Secondary | ICD-10-CM

## 2017-02-22 LAB — LIPID PANEL
Cholesterol: 192 mg/dL (ref <200)
HDL: 46 mg/dL (ref >40)
LDL CHOLESTEROL (CALC): 121 mg/dL — AB
NON-HDL CHOLESTEROL (CALC): 146 mg/dL — AB (ref <130)
TRIGLYCERIDES: 141 mg/dL (ref <150)
Total CHOL/HDL Ratio: 4.2 (calc) (ref <5.0)

## 2017-02-22 LAB — CBC WITH DIFFERENTIAL/PLATELET
BASOS ABS: 52 {cells}/uL (ref 0–200)
Basophils Relative: 0.9 %
EOS PCT: 6.7 %
Eosinophils Absolute: 389 cells/uL (ref 15–500)
HEMATOCRIT: 39.7 % (ref 38.5–50.0)
HEMOGLOBIN: 13.1 g/dL — AB (ref 13.2–17.1)
LYMPHS ABS: 1148 {cells}/uL (ref 850–3900)
MCH: 29.1 pg (ref 27.0–33.0)
MCHC: 33 g/dL (ref 32.0–36.0)
MCV: 88.2 fL (ref 80.0–100.0)
MPV: 11.8 fL (ref 7.5–12.5)
Monocytes Relative: 8.4 %
NEUTROS ABS: 3724 {cells}/uL (ref 1500–7800)
Neutrophils Relative %: 64.2 %
Platelets: 183 10*3/uL (ref 140–400)
RBC: 4.5 10*6/uL (ref 4.20–5.80)
RDW: 15.1 % — ABNORMAL HIGH (ref 11.0–15.0)
Total Lymphocyte: 19.8 %
WBC mixed population: 487 cells/uL (ref 200–950)
WBC: 5.8 10*3/uL (ref 3.8–10.8)

## 2017-02-22 LAB — BASIC METABOLIC PANEL WITH GFR
BUN/Creatinine Ratio: 13 (calc) (ref 6–22)
BUN: 17 mg/dL (ref 7–25)
CALCIUM: 9.6 mg/dL (ref 8.6–10.3)
CO2: 26 mmol/L (ref 20–32)
CREATININE: 1.28 mg/dL — AB (ref 0.70–1.11)
Chloride: 104 mmol/L (ref 98–110)
GFR, EST NON AFRICAN AMERICAN: 50 mL/min/{1.73_m2} — AB (ref >=60)
GFR, Est African American: 58 mL/min/{1.73_m2} — ABNORMAL LOW (ref >=60)
Glucose, Bld: 76 mg/dL (ref 65–99)
Potassium: 4.2 mmol/L (ref 3.5–5.3)
Sodium: 138 mmol/L (ref 135–146)

## 2017-02-22 LAB — HEPATIC FUNCTION PANEL
AG RATIO: 1.4 (calc) (ref 1.0–2.5)
ALBUMIN MSPROF: 3.7 g/dL (ref 3.6–5.1)
ALT: 11 U/L (ref 9–46)
AST: 15 U/L (ref 10–35)
Alkaline phosphatase (APISO): 74 U/L (ref 40–115)
BILIRUBIN TOTAL: 0.6 mg/dL (ref 0.2–1.2)
Bilirubin, Direct: 0.1 mg/dL (ref 0.0–0.2)
Globulin: 2.7 g/dL (calc) (ref 1.9–3.7)
Indirect Bilirubin: 0.5 mg/dL (calc) (ref 0.2–1.2)
Total Protein: 6.4 g/dL (ref 6.1–8.1)

## 2017-02-22 LAB — TSH: TSH: 2.29 m[IU]/L (ref 0.40–4.50)

## 2017-02-22 NOTE — Patient Instructions (Signed)
Dementia Dementia is a general term for problems with brain function. A person with dementia has memory loss and a hard time with at least one other brain function such as thinking, speaking, or problem solving. Dementia can affect social functioning, how you do your job, your mood, or your personality. The changes may be hidden for a long time. The earliest forms of this disease are usually not detected by family or friends. Dementia can be:  Irreversible.  Potentially reversible.  Partially reversible.  Progressive. This means it can get worse over time. CAUSES  Irreversible dementia causes may include:  Degeneration of brain cells (Alzheimer's disease or lewy body dementia).  Multiple small strokes (vascular dementia).  Infection (chronic meningitis or Creutzfelt-Jakob disease).  Frontotemporal dementia. This affects younger people, age 40 to 70, compared to those who have Alzheimer's disease.  Dementia associated with other disorders like Parkinson's disease, Huntington's disease, or HIV-associated dementia. Potentially or partially reversible dementia causes may include:  Medicines.  Metabolic causes such as excessive alcohol intake, vitamin B12 deficiency, or thyroid disease.  Masses or pressure in the brain such as a tumor, blood clot, or hydrocephalus. SYMPTOMS  Symptoms are often hard to detect. Family members or coworkers may not notice them early in the disease process. Different people with dementia may have different symptoms. Symptoms can include:  A hard time with memory, especially recent memory. Long-term memory may not be impaired.  Asking the same question multiple times or forgetting something someone just said.  A hard time speaking your thoughts or finding certain words.  A hard time solving problems or performing familiar tasks (such as how to use a telephone).  Sudden changes in mood.  Changes in personality, especially increasing moodiness or  mistrust.  Depression.  A hard time understanding complex ideas that were never a problem in the past. DIAGNOSIS  There are no specific tests for dementia.   Your caregiver may recommend a thorough evaluation. This is because some forms of dementia can be reversible. The evaluation will likely include a physical exam and getting a detailed history from you and a family member. The history often gives the best clues and suggestions for a diagnosis.  Memory testing may be done. A detailed brain function evaluation called neuropsychologic testing may be helpful.  Lab tests and brain imaging (such as a CT scan or MRI scan) are sometimes important.  Sometimes observation and re-evaluation over time is very helpful. TREATMENT  Treatment depends on the cause.   If the problem is a vitamin deficiency, it may be helped or cured with supplements.  For dementias such as Alzheimer's disease, medicines are available to stabilize or slow the course of the disease. There are no cures for this type of dementia.  Your caregiver can help direct you to groups, organizations, and other caregivers to help with decisions in the care of you or your loved one. HOME CARE INSTRUCTIONS The care of individuals with dementia is varied and dependent upon the progression of the dementia. The following suggestions are intended for the person living with, or caring for, the person with dementia.  Create a safe environment.  Remove the locks on bathroom doors to prevent the person from accidentally locking himself or herself in.  Use childproof latches on kitchen cabinets and any place where cleaning supplies, chemicals, or alcohol are kept.  Use childproof covers in unused electrical outlets.  Install childproof devices to keep doors and windows secured.  Remove stove knobs or install safety   knobs and an automatic shut-off on the stove.  Lower the temperature on water heaters.  Label medicines and keep them  locked up.  Secure knives, lighters, matches, power tools, and guns, and keep these items out of reach.  Keep the house free from clutter. Remove rugs or anything that might contribute to a fall.  Remove objects that might break and hurt the person.  Make sure lighting is good, both inside and outside.  Install grab rails as needed.  Use a monitoring device to alert you to falls or other needs for help.  Reduce confusion.  Keep familiar objects and people around.  Use night lights or dim lights at night.  Label items or areas.  Use reminders, notes, or directions for daily activities or tasks.  Keep a simple, consistent routine for waking, meals, bathing, dressing, and bedtime.  Create a calm, quiet environment.  Place large clocks and calendars prominently.  Display emergency numbers and home address near all telephones.  Use cues to establish different times of the day. An example is to open curtains to let the natural light in during the day.   Use effective communication.  Choose simple words and short sentences.  Use a gentle, calm tone of voice.  Be careful not to interrupt.  If the person is struggling to find a word or communicate a thought, try to provide the word or thought.  Ask one question at a time. Allow the person ample time to answer questions. Repeat the question again if the person does not respond.  Reduce nighttime restlessness.  Provide a comfortable bed.  Have a consistent nighttime routine.  Ensure a regular walking or physical activity schedule. Involve the person in daily activities as much as possible.  Limit napping during the day.  Limit caffeine.  Attend social events that stimulate rather than overwhelm the senses.  Encourage good nutrition and hydration.  Reduce distractions during meal times and snacks.  Avoid foods that are too hot or too cold.  Monitor chewing and swallowing ability.  Continue with routine vision,  hearing, dental, and medical screenings.  Only give over-the-counter or prescription medicines as directed by the caregiver.  Monitor driving abilities. Do not allow the person to drive when safe driving is no longer possible.  Register with an identification program which could provide location assistance in the event of a missing person situation. SEEK MEDICAL CARE IF:   New behavioral problems start such as moodiness, aggressiveness, or seeing things that are not there (hallucinations).  Any new problem with brain function happens. This includes problems with balance, speech, or falling a lot.  Problems with swallowing develop.  Any symptoms of other illness happen. Small changes or worsening in any aspect of brain function can be a sign that the illness is getting worse. It can also be a sign of another medical illness such as infection. Seeing a caregiver right away is important. SEEK IMMEDIATE MEDICAL CARE IF:   A fever develops.  New or worsened confusion develops.  New or worsened sleepiness develops.  Staying awake becomes hard to do. Document Released: 08/16/2000 Document Revised: 05/15/2011 Document Reviewed: 07/18/2010 Exit[employer] Patient Information 2014 [employer].  

## 2017-03-21 ENCOUNTER — Telehealth: Payer: Self-pay | Admitting: *Deleted

## 2017-03-21 NOTE — Telephone Encounter (Signed)
A message was left to inform [facility] it is OK to start palative care for the patient, per Dr Melford Aase.

## 2017-04-20 ENCOUNTER — Emergency Department (HOSPITAL_COMMUNITY): Payer: Medicare Other

## 2017-04-20 ENCOUNTER — Other Ambulatory Visit: Payer: Self-pay

## 2017-04-20 ENCOUNTER — Inpatient Hospital Stay (HOSPITAL_COMMUNITY)
Admission: EM | Admit: 2017-04-20 | Discharge: 2017-04-22 | DRG: 293 | Disposition: A | Discharge status: Home / Self Care | Payer: Medicare Other | Attending: Family Medicine | Admitting: Family Medicine

## 2017-04-20 ENCOUNTER — Encounter (HOSPITAL_COMMUNITY): Payer: Self-pay | Admitting: Emergency Medicine

## 2017-04-20 DIAGNOSIS — I5031 Acute diastolic (congestive) heart failure: Secondary | ICD-10-CM

## 2017-04-20 DIAGNOSIS — Z79899 Other long term (current) drug therapy: Secondary | ICD-10-CM

## 2017-04-20 DIAGNOSIS — R0602 Shortness of breath: Secondary | ICD-10-CM | POA: Diagnosis not present

## 2017-04-20 DIAGNOSIS — Z87891 Personal history of nicotine dependence: Secondary | ICD-10-CM | POA: Diagnosis not present

## 2017-04-20 DIAGNOSIS — F0390 Unspecified dementia without behavioral disturbance: Secondary | ICD-10-CM | POA: Diagnosis present

## 2017-04-20 DIAGNOSIS — I5033 Acute on chronic diastolic (congestive) heart failure: Secondary | ICD-10-CM | POA: Diagnosis present

## 2017-04-20 DIAGNOSIS — Z8673 Personal history of transient ischemic attack (TIA), and cerebral infarction without residual deficits: Secondary | ICD-10-CM | POA: Diagnosis not present

## 2017-04-20 DIAGNOSIS — R140 Abdominal distension (gaseous): Secondary | ICD-10-CM | POA: Diagnosis not present

## 2017-04-20 DIAGNOSIS — Z880 Allergy status to penicillin: Secondary | ICD-10-CM

## 2017-04-20 DIAGNOSIS — N289 Disorder of kidney and ureter, unspecified: Secondary | ICD-10-CM | POA: Diagnosis not present

## 2017-04-20 DIAGNOSIS — Z7982 Long term (current) use of aspirin: Secondary | ICD-10-CM

## 2017-04-20 DIAGNOSIS — Z8546 Personal history of malignant neoplasm of prostate: Secondary | ICD-10-CM | POA: Diagnosis not present

## 2017-04-20 DIAGNOSIS — R338 Other retention of urine: Secondary | ICD-10-CM | POA: Diagnosis present

## 2017-04-20 DIAGNOSIS — I110 Hypertensive heart disease with heart failure: Secondary | ICD-10-CM | POA: Diagnosis present

## 2017-04-20 DIAGNOSIS — I5043 Acute on chronic combined systolic (congestive) and diastolic (congestive) heart failure: Secondary | ICD-10-CM

## 2017-04-20 DIAGNOSIS — Z66 Do not resuscitate: Secondary | ICD-10-CM | POA: Diagnosis present

## 2017-04-20 DIAGNOSIS — E039 Hypothyroidism, unspecified: Secondary | ICD-10-CM | POA: Diagnosis present

## 2017-04-20 DIAGNOSIS — Z8551 Personal history of malignant neoplasm of bladder: Secondary | ICD-10-CM

## 2017-04-20 LAB — I-STAT TROPONIN, ED: Troponin i, poc: 0.07 ng/mL (ref 0.00–0.08)

## 2017-04-20 LAB — CBC
HCT: 39.8 % (ref 39.0–52.0)
HEMOGLOBIN: 12.8 g/dL — AB (ref 13.0–17.0)
MCH: 28.8 pg (ref 26.0–34.0)
MCHC: 32.2 g/dL (ref 30.0–36.0)
MCV: 89.6 fL (ref 78.0–100.0)
Platelets: 169 10*3/uL (ref 150–400)
RBC: 4.44 MIL/uL (ref 4.22–5.81)
RDW: 17.4 % — ABNORMAL HIGH (ref 11.5–15.5)
WBC: 6.4 10*3/uL (ref 4.0–10.5)

## 2017-04-20 LAB — BASIC METABOLIC PANEL
ANION GAP: 8 (ref 5–15)
BUN: 28 mg/dL — ABNORMAL HIGH (ref 6–20)
CO2: 23 mmol/L (ref 22–32)
Calcium: 9.5 mg/dL (ref 8.9–10.3)
Chloride: 110 mmol/L (ref 101–111)
Creatinine, Ser: 1.46 mg/dL — ABNORMAL HIGH (ref 0.61–1.24)
GFR, EST AFRICAN AMERICAN: 48 mL/min — AB (ref >60)
GFR, EST NON AFRICAN AMERICAN: 41 mL/min — AB (ref >60)
GLUCOSE: 113 mg/dL — AB (ref 65–99)
Potassium: 3.9 mmol/L (ref 3.5–5.1)
Sodium: 141 mmol/L (ref 135–145)

## 2017-04-20 LAB — TROPONIN I: TROPONIN I: 0.08 ng/mL — AB (ref <0.03)

## 2017-04-20 LAB — BRAIN NATRIURETIC PEPTIDE: B Natriuretic Peptide: 2042.2 pg/mL — ABNORMAL HIGH (ref 0.0–100.0)

## 2017-04-20 LAB — TSH: TSH: 3.044 u[IU]/mL (ref 0.350–4.500)

## 2017-04-20 MED ORDER — HEPARIN SODIUM (PORCINE) 5000 UNIT/ML IJ SOLN
5000.0000 [IU] | Freq: Three times a day (TID) | INTRAMUSCULAR | Status: DC
  Administered 2017-04-20 – 2017-04-22 (×5): 5000 [IU] via SUBCUTANEOUS
  Filled 2017-04-20 (×5): qty 1

## 2017-04-20 MED ORDER — ACETAMINOPHEN 325 MG PO TABS: 650.0000 mg | ORAL_TABLET | Freq: Four times a day (QID) | ORAL | Status: DC | PRN

## 2017-04-20 MED ORDER — SODIUM CHLORIDE 0.9% FLUSH
3.0000 mL | Freq: Two times a day (BID) | INTRAVENOUS | Status: DC
  Administered 2017-04-20 – 2017-04-22 (×4): 3 mL via INTRAVENOUS

## 2017-04-20 MED ORDER — FUROSEMIDE 10 MG/ML IJ SOLN
40.0000 mg | Freq: Two times a day (BID) | INTRAMUSCULAR | Status: DC
  Administered 2017-04-21 – 2017-04-22 (×3): 40 mg via INTRAVENOUS
  Filled 2017-04-20 (×3): qty 4

## 2017-04-20 MED ORDER — VITAMIN D3 25 MCG (1000 UNIT) PO TABS
5000.0000 [IU] | ORAL_TABLET | Freq: Every morning | ORAL | Status: DC
  Administered 2017-04-21 – 2017-04-22 (×2): 5000 [IU] via ORAL
  Filled 2017-04-20 (×2): qty 5

## 2017-04-20 MED ORDER — FUROSEMIDE 10 MG/ML IJ SOLN
40.0000 mg | Freq: Once | INTRAMUSCULAR | Status: AC
  Administered 2017-04-20: 40 mg via INTRAVENOUS
  Filled 2017-04-20: qty 4

## 2017-04-20 MED ORDER — ASPIRIN EC 81 MG PO TBEC
81.0000 mg | DELAYED_RELEASE_TABLET | Freq: Every day | ORAL | Status: DC
  Administered 2017-04-20 – 2017-04-22 (×3): 81 mg via ORAL
  Filled 2017-04-20 (×3): qty 1

## 2017-04-20 MED ORDER — MIRTAZAPINE 15 MG PO TABS: 15.0000 mg | ORAL_TABLET | Freq: Every day | ORAL | Status: DC

## 2017-04-20 MED ORDER — HYDROCODONE-ACETAMINOPHEN 5-325 MG PO TABS: 1.0000 | ORAL_TABLET | ORAL | Status: DC | PRN

## 2017-04-20 MED ORDER — SODIUM CHLORIDE 0.9% FLUSH: 3.0000 mL | INTRAVENOUS | Status: DC | PRN

## 2017-04-20 MED ORDER — LEVOTHYROXINE SODIUM 75 MCG PO TABS
75.0000 ug | ORAL_TABLET | Freq: Every day | ORAL | Status: DC
  Administered 2017-04-21 – 2017-04-22 (×2): 75 ug via ORAL
  Filled 2017-04-20 (×2): qty 1

## 2017-04-20 MED ORDER — TAMSULOSIN HCL 0.4 MG PO CAPS
0.4000 mg | ORAL_CAPSULE | Freq: Every day | ORAL | Status: DC
  Administered 2017-04-20 – 2017-04-22 (×3): 0.4 mg via ORAL
  Filled 2017-04-20 (×3): qty 1

## 2017-04-20 MED ORDER — ONDANSETRON HCL 4 MG PO TABS: 4.0000 mg | ORAL_TABLET | Freq: Four times a day (QID) | ORAL | Status: DC | PRN

## 2017-04-20 MED ORDER — ESCITALOPRAM OXALATE 10 MG PO TABS
10.0000 mg | ORAL_TABLET | Freq: Every day | ORAL | Status: DC
  Administered 2017-04-21 – 2017-04-22 (×2): 10 mg via ORAL
  Filled 2017-04-20 (×2): qty 1

## 2017-04-20 MED ORDER — ORAL CARE MOUTH RINSE
15.0000 mL | Freq: Two times a day (BID) | OROMUCOSAL | Status: DC
  Administered 2017-04-22: 15 mL via OROMUCOSAL

## 2017-04-20 MED ORDER — ONDANSETRON HCL 4 MG/2ML IJ SOLN: 4.0000 mg | Freq: Four times a day (QID) | INTRAMUSCULAR | Status: DC | PRN

## 2017-04-20 MED ORDER — QUETIAPINE FUMARATE 25 MG PO TABS
50.0000 mg | ORAL_TABLET | Freq: Every day | ORAL | Status: DC
  Administered 2017-04-20 – 2017-04-21 (×2): 50 mg via ORAL
  Filled 2017-04-20 (×2): qty 2

## 2017-04-20 MED ORDER — ACETAMINOPHEN 650 MG RE SUPP: 650.0000 mg | Freq: Four times a day (QID) | RECTAL | Status: DC | PRN

## 2017-04-20 MED ORDER — SODIUM CHLORIDE 0.9 % IV SOLN
250.0000 mL | INTRAVENOUS | Status: DC | PRN
Start: 2017-04-20 — End: 2017-04-22

## 2017-04-20 MED ORDER — ALBUTEROL SULFATE (2.5 MG/3ML) 0.083% IN NEBU: 2.5000 mg | INHALATION_SOLUTION | RESPIRATORY_TRACT | Status: DC | PRN

## 2017-04-20 NOTE — ED Provider Notes (Signed)
[facility] Provider Note   CSN: [id number] Arrival date & time: 04/20/17  1415     History   Chief Complaint Chief Complaint  Patient presents with  . Weight Gain  . Leg Swelling    HPI [name] [name] is a 84 y.o. male PMH of G1DD, HTN, and dementia presenting with increased ankle swelling and progressively DOE.   Patient is accompanied by his daughter who provides the history. The patient lives with his daughter. Patient's daughter states that her father has been experiencing progressively worsening dyspnea on exertion for the past 3 weeks. He can typically walk around the house without experiencing SOB, but lately has been dyspneic with minimal exertion. She denies orthopnea, and states her father is able to sleep flat on his back, but has been sleeping on his side lately. She weighs him daily and if he gains three pounds she will give him 40 mg of lasix. He was previously on 40 mg BID, but that was discontinued after a syncopal episode thought to be due to dehydration. She states that yesterday she weighed him and he had gained 3 pounds, so she gave him a dose of lasix yesterday. Today when she weighed him, he was back to his dry weight, but the home nurse and herself noticed increased ankle swelling. She also has noticed weight gain around his abdomen, and that the patient has been having difficulty buttoning his pants. She states the patient does not eat salt and adheres closely to a low sodium diet. He does not have a fluid restriction, but she states he does not drink excessive amounts of fluid. She denies recent infections/illness. She says the patient has not complained about chest pain or abdominal pain. Patient denies chest pain or shortness of breath.       Past Medical History:  Diagnosis Date  . Bladder cancer ([place])   . Dementia   . Depression   . HTN (hypertension)    off medications  . HTN (hypertension)   . Hypothyroid     . Hypothyroidism   . Other testicular hypofunction   . Prediabetes   . Prostate cancer ([place])   . Stroke ([place]) 1980  . Stroke ([place])   . Vitamin D deficiency     Patient Active Problem List   Diagnosis Date Noted  . CHF (congestive heart failure) ([place]) 04/20/2017  . Syncope 01/06/2017  . Elevated troponin   . Pericardial effusion without cardiac tamponade 10/09/2016  . Coronary artery disease 06/28/2016  . HOH (hard of hearing) 04/14/2016  . Thrombocytopenia ([place]) 04/13/2016  . Anemia 04/13/2016  . Dementia without behavioral disturbance 03/29/2016  . Chronic combined systolic and diastolic congestive heart failure ([place])   . History of prostate cancer 02/25/2016  . History of bladder cancer 02/25/2016  . Bilateral pleural effusion 02/25/2016  . Acute respiratory failure ([place]) 02/25/2016  . CKD (chronic kidney disease) stage 3, GFR 30-59 ml/min ([facility]) 02/25/2016  . Depression, major, in remission ([place]) 02/22/2015  . SDAT (senile dementia of Alzheimer's type) 10/07/2014  . Overactive bladder 03/10/2014  . Hyperlipidemia 04/02/2013  . Medication management 04/02/2013  . Hypothyroid   . Vitamin D deficiency   . Prediabetes   . Testosterone deficiency   . RBBB 12/25/2012  . HTN (hypertension) 12/25/2012    Past Surgical History:  Procedure Laterality Date  . BIOPSY PROSTATE  2/99   pos for adenocarcinoma of prostate  . BLADDER SURGERY    . CYSTOURETHROSCOPY  7/99   and TUR of bladder cancer  . PROSTATE SURGERY  2007  . PROSTATE SURGERY         Home Medications    Prior to Admission medications   Medication Sig Start Date End Date Taking? Authorizing Provider  aspirin EC 81 MG tablet Take 81 mg by mouth daily.   Yes [provider]  Cholecalciferol (VITAMIN D) 2000 units CAPS Take 2,000 Units by mouth daily.   Yes [provider]  cholecalciferol 5000 units TABS Take 5,000 Units every morning by mouth. 01/10/17  Yes Unk Pinto, MD   escitalopram (LEXAPRO) 10 MG tablet Take 10 mg by mouth every morning.    Yes [provider]  levothyroxine (SYNTHROID, LEVOTHROID) 75 MCG tablet Take 1 tablet (75 mcg total) by mouth daily. 05/26/15  Yes Forcucci, Courtney, PA-C  Omega-3 Fatty Acids (FISH OIL PO) Take 1 capsule by mouth daily.   Yes [provider]  QUEtiapine (SEROQUEL) 100 MG tablet Take 100 mg by mouth at bedtime.    Yes [provider]  mirtazapine (REMERON) 15 MG tablet Take 1 tablet at bedtime for Appetite Patient not taking: Reported on 04/20/2017 01/22/17 04/24/17  Unk Pinto, MD    Family History Family History  Problem Relation Age of Onset  . Heart disease Mother   . Other Father        Brain tumor  . Thyroid disease Father     Social History Social History   Tobacco Use  . Smoking status: Former Smoker    Packs/day: 1.00    Years: 10.00    Pack years: 10.00    Types: Cigarettes    Last attempt to quit: 12/26/1963    Years since quitting: 53.3  . Smokeless tobacco: Never Used  Substance Use Topics  . Alcohol use: No  . Drug use: No     Allergies   Penicillins and Penicillins   Review of Systems Review of Systems  Constitutional: Negative for chills and fever.  HENT: Negative for rhinorrhea and sore throat.   Respiratory: Positive for cough and shortness of breath.   Gastrointestinal: Positive for abdominal distention. Negative for diarrhea, nausea and vomiting.  Genitourinary: Negative.   Neurological: Negative.  Negative for syncope.     Physical Exam Updated Vital Signs BP (!) 141/87   Pulse 65   Temp 98.6 F (37 C) (Oral)   Resp 18   Ht 5\' 8"  (1.727 m)   Wt 72.6 kg (160 lb) Comment: weighed this morning  SpO2 96%   BMI 24.33 kg/m   Physical Exam  Constitutional: He appears well-developed. No distress.  HENT:  Head: Normocephalic and atraumatic.  Mouth/Throat: Oropharynx is clear and moist.  Eyes: Conjunctivae and EOM are normal.   Neck: Normal range of motion. Neck supple. JVD present.  Cardiovascular: Normal rate.  Pulmonary/Chest: No accessory muscle usage. No tachypnea. He has rales in the right lower field and the left lower field.  Mild increased work of breathing.   Abdominal: Soft. Bowel sounds are normal. There is no tenderness.     ED Treatments / Results  Labs (all labs ordered are listed, but only abnormal results are displayed) Labs Reviewed  BASIC METABOLIC PANEL - Abnormal; Notable for the following components:      Result Value   Glucose, Bld 113 (*)    BUN 28 (*)    Creatinine, Ser 1.46 (*)    GFR calc non Af Amer 41 (*)    GFR  calc Af Amer 48 (*)    All other components within normal limits  CBC - Abnormal; Notable for the following components:   Hemoglobin 12.8 (*)    RDW 17.4 (*)    All other components within normal limits  BRAIN NATRIURETIC PEPTIDE - Abnormal; Notable for the following components:   B Natriuretic Peptide 2,042.2 (*)    All other components within normal limits  CBC  CREATININE, SERUM  TSH  BASIC METABOLIC PANEL  TROPONIN I  TROPONIN I  TROPONIN I  I-STAT TROPONIN, ED    EKG  EKG Interpretation None       Radiology Dg Chest 2 View  Result Date: 04/20/2017 CLINICAL DATA:  84 year old male with history of recent weight gain and ankle swelling. Shortness of breath. History of congestive heart failure. EXAM: CHEST  2 VIEW COMPARISON:  Chest x-ray lymph FINDINGS: Lung volumes are low. Bibasilar opacities likely reflect areas of subsegmental atelectasis with superimposed small (right greater than left) bilateral pleural effusions. There is cephalization of the pulmonary vasculature and slight indistinctness of the interstitial markings suggestive of mild pulmonary edema. Mild cardiomegaly upper mediastinal contours are within normal limits. Aortic atherosclerosis. To small surgical clips project over the lower right hemithorax. IMPRESSION: 1. Appearance the chest  suggests mild congestive heart failure, as above. 2. Aortic atherosclerosis. Electronically Signed   By: Vinnie Langton M.D.   On: 04/20/2017 16:39    Procedures Procedures (including critical care time)  Medications Ordered in ED Medications  aspirin EC tablet 81 mg (not administered)  cholecalciferol (VITAMIN D) tablet 5,000 Units (not administered)  escitalopram (LEXAPRO) tablet 10 mg (not administered)  levothyroxine (SYNTHROID, LEVOTHROID) tablet 75 mcg (not administered)  mirtazapine (REMERON) tablet 15 mg (not administered)  QUEtiapine (SEROQUEL) tablet 50 mg (not administered)  heparin injection 5,000 Units (not administered)  sodium chloride flush (NS) 0.9 % injection 3 mL (not administered)  sodium chloride flush (NS) 0.9 % injection 3 mL (not administered)  0.9 %  sodium chloride infusion (not administered)  ondansetron (ZOFRAN) tablet 4 mg (not administered)    Or  ondansetron (ZOFRAN) injection 4 mg (not administered)  acetaminophen (TYLENOL) tablet 650 mg (not administered)    Or  acetaminophen (TYLENOL) suppository 650 mg (not administered)  HYDROcodone-acetaminophen (NORCO/VICODIN) 5-325 MG per tablet 1-2 tablet (not administered)  albuterol (PROVENTIL) (2.5 MG/3ML) 0.083% nebulizer solution 2.5 mg (not administered)  furosemide (LASIX) injection 40 mg (not administered)  tamsulosin (FLOMAX) capsule 0.4 mg (not administered)  furosemide (LASIX) injection 40 mg (40 mg Intravenous Given 04/20/17 1854)     Initial Impression / Assessment and Plan / ED Course  I have reviewed the triage vital signs and the nursing notes.  Pertinent labs & imaging results that were available during my care of the patient were reviewed by me and considered in my medical decision making (see chart for details).   84 yo male PMH of HTN and G1DD presenting with progressively worsening DOE and lower extremity edema. VSS, saturating high 90s on RA. Physical exam with JVD, bibasilar crackles  and pitting edema bilateral lower extremities. BNP 2042, Cr 1.46, I-stat troponin 0.07. CXR consistent with pulmonary edema. Patient's presentation consistent with acute on chronic heart failure exacerbation. Patient had syncopal episode due to over diuresis in past. Will need admission for diuresis and further cardiac work up.   [facility] to admit patient.   Final Clinical Impressions(s) / ED Diagnoses   Final diagnoses:  Acute on chronic combined systolic and diastolic congestive  heart failure [facility])    ED Discharge Orders    None       Melanee Spry, MD 04/20/17 2007    Drenda Freeze, MD 04/23/17 [phone]

## 2017-04-20 NOTE — ED Notes (Signed)
ED TO INPATIENT HANDOFF REPORT  Name/Age/Gender [name] [name] 84 y.o. male  Code Status Code Status History    Date Active Date Inactive Code Status Order ID Comments User Context   01/06/2017 01:06 01/07/2017 18:20 DNR [id number]  Etta Quill, DO ED   01/06/2017 01:06 01/06/2017 01:06 Full Code [id number]  Etta Quill, DO ED   04/13/2016 23:32 04/14/2016 17:23 DNR [id number]  Karmen Bongo, MD Inpatient   02/28/2016 12:44 03/01/2016 16:44 DNR [id number]  Albertine Patricia, MD Inpatient   02/25/2016 22:07 02/28/2016 12:44 Full Code [id number]  Toy Baker, MD ED   02/25/2016 22:07 02/25/2016 22:07 Full Code [id number]  Toy Baker, MD ED    Questions for Most Recent Historical Code Status (Order [id number])    Question Answer Comment   In the event of cardiac or respiratory ARREST Do not call a "code blue"    In the event of cardiac or respiratory ARREST Do not perform Intubation, CPR, defibrillation or ACLS    In the event of cardiac or respiratory ARREST Use medication by any route, position, wound care, and other measures to relive pain and suffering. May use oxygen, suction and manual treatment of airway obstruction as needed for comfort.       Home/SNF/Other Home  Chief Complaint SOB / ankles swollen / coughing   Level of Care/Admitting Diagnosis ED Disposition    ED Disposition Condition Comment   Admit  Hospital Area: [facility] [[id number]]  Level of Care: Telemetry [5]  Admit to tele based on following criteria: Monitor for Ischemic changes  Diagnosis: CHF (congestive heart failure) [facility]) [[id number]]  Admitting Physician: Merton Border Marshal.Browner  Attending Physician: Laren Everts, ALI Marshal.Browner  Estimated length of stay: past midnight tomorrow  Certification:: I certify this patient will need inpatient services for at least 2 midnights  PT Class (Do Not Modify): Inpatient [101]  PT Acc Code (Do Not Modify): Private [1]       Medical  History Past Medical History:  Diagnosis Date  . Bladder cancer ([place])   . Dementia   . Depression   . HTN (hypertension)    off medications  . HTN (hypertension)   . Hypothyroid   . Hypothyroidism   . Other testicular hypofunction   . Prediabetes   . Prostate cancer ([place])   . Stroke ([place]) 1980  . Stroke ([place])   . Vitamin D deficiency     Allergies Allergies  Allergen Reactions  . Penicillins Hives, Itching, Swelling and Rash       . Penicillins Rash    IV Location/Drains/Wounds Patient Lines/Drains/Airways Status   Active Line/Drains/Airways    Name:   Placement date:   Placement time:   Site:   Days:   Peripheral IV 04/13/16 Right Forearm   04/13/16    -    Forearm   372          Labs/Imaging Results for orders placed or performed during the hospital encounter of 04/20/17 (from the past 48 hour(s))  Basic metabolic panel     Status: Abnormal   Collection Time: 04/20/17  2:54 PM  Result Value Ref Range   Sodium 141 135 - 145 mmol/L   Potassium 3.9 3.5 - 5.1 mmol/L   Chloride 110 101 - 111 mmol/L   CO2 23 22 - 32 mmol/L   Glucose, Bld 113 (H) 65 - 99 mg/dL   BUN 28 (H) 6 - 20 mg/dL   Creatinine, Ser 1.46 (H) 0.61 -  1.24 mg/dL   Calcium 9.5 8.9 - 10.3 mg/dL   GFR calc non Af Amer 41 (L) >60 mL/min   GFR calc Af Amer 48 (L) >60 mL/min    Comment: (NOTE) The eGFR has been calculated using the CKD EPI equation. This calculation has not been validated in all clinical situations. eGFR's persistently <60 mL/min signify possible Chronic Kidney Disease.    Anion gap 8 5 - 15    Comment: Performed at [facility], [place] [address] [place], [place] [address]  CBC     Status: Abnormal   Collection Time: 04/20/17  2:54 PM  Result Value Ref Range   WBC 6.4 4.0 - 10.5 K/uL   RBC 4.44 4.22 - 5.81 MIL/uL   Hemoglobin 12.8 (L) 13.0 - 17.0 g/dL   HCT 39.8 39.0 - 52.0 %   MCV 89.6 78.0 - 100.0 fL   MCH 28.8 26.0 - 34.0 pg   MCHC 32.2 30.0 - 36.0 g/dL    RDW 17.4 (H) 11.5 - 15.5 %   Platelets 169 150 - 400 K/uL    Comment: Performed at [facility], [place] [address] [place], [place] [address]  Brain natriuretic peptide     Status: Abnormal   Collection Time: 04/20/17  2:54 PM  Result Value Ref Range   B Natriuretic Peptide 2,042.2 (H) 0.0 - 100.0 pg/mL    Comment: Performed at [facility], [place] [address], [place], [place] [address]  I-stat troponin, ED     Status: None   Collection Time: 04/20/17  3:05 PM  Result Value Ref Range   Troponin i, poc 0.07 0.00 - 0.08 ng/mL   Comment 3            Comment: Due to the release kinetics of cTnI, a negative result within the first hours of the onset of symptoms does not rule out myocardial infarction with certainty. If myocardial infarction is still suspected, repeat the test at appropriate intervals.    Dg Chest 2 View  Result Date: 04/20/2017 CLINICAL DATA:  84 year old male with history of recent weight gain and ankle swelling. Shortness of breath. History of congestive heart failure. EXAM: CHEST  2 VIEW COMPARISON:  Chest x-ray lymph FINDINGS: Lung volumes are low. Bibasilar opacities likely reflect areas of subsegmental atelectasis with superimposed small (right greater than left) bilateral pleural effusions. There is cephalization of the pulmonary vasculature and slight indistinctness of the interstitial markings suggestive of mild pulmonary edema. Mild cardiomegaly upper mediastinal contours are within normal limits. Aortic atherosclerosis. To small surgical clips project over the lower right hemithorax. IMPRESSION: 1. Appearance the chest suggests mild congestive heart failure, as above. 2. Aortic atherosclerosis. Electronically Signed   By: Vinnie Langton M.D.   On: 04/20/2017 16:39    Pending Labs [employer] (From admission, onward)   Start     Ordered   Signed and Held  CBC  (heparin)  Once,   R    Comments:  Baseline for heparin therapy  IF NOT ALREADY DRAWN.  Notify MD if PLT < 100 K.    Signed and Held   Signed and Held  Creatinine, serum  (heparin)  Once,   R    Comments:  Baseline for heparin therapy IF NOT ALREADY DRAWN.    Signed and Held   Signed and Held  TSH  Once,   R     Signed and Held   Signed and [employer]  Tomorrow morning,   R     Signed and Held   Signed and Held  Troponin I  Now then every 6 hours,   R     Signed and Held      Vitals/Pain Today's Vitals   04/20/17 1732 04/20/17 1855 04/20/17 1935 04/20/17 1935  BP: (!) 138/105   (!) 141/87  Pulse:    65  Resp: 17   18  Temp:  98.6 F (37 C)    TempSrc:  Oral    SpO2:    96%  Weight:      Height:      PainSc:   0-No pain     Isolation Precautions No active isolations  Medications Medications  furosemide (LASIX) injection 40 mg (40 mg Intravenous Given 04/20/17 1854)    Mobility walks

## 2017-04-20 NOTE — ED Triage Notes (Signed)
Pt recent weight gain, ankle swelling, and SOB; hx of CHF.

## 2017-04-20 NOTE — H&P (Signed)
[facility]                                                                                    Patient Demographics  [name] [name], is a 84 y.o. male  CSN: [id number]  MRN: [id number]  DOB - May 30, 1927  Admit Date - 04/20/2017  Outpatient Primary MD for the patient is Unk Pinto, MD   With History of -  Past Medical History:  Diagnosis Date  . Bladder cancer ([place])   . Dementia   . Depression   . HTN (hypertension)    off medications  . HTN (hypertension)   . Hypothyroid   . Hypothyroidism   . Other testicular hypofunction   . Prediabetes   . Prostate cancer ([place])   . Stroke ([place]) 1980  . Stroke ([place])   . Vitamin D deficiency       Past Surgical History:  Procedure Laterality Date  . BIOPSY PROSTATE  2/99   pos for adenocarcinoma of prostate  . BLADDER SURGERY    . CYSTOURETHROSCOPY  7/99   and TUR of bladder cancer  . PROSTATE SURGERY  2007  . PROSTATE SURGERY      in for   Chief Complaint  Patient presents with  . Weight Gain  . Leg Swelling     HPI  [name] [name]  is a 84 y.o. male, with past medical history significant for diastolic congestive heart failure of Lasix prostatic CA, bladder CA status post bladder surgery and TURP presenting today with increased shortness of breath last few weeks versus patient denies any fever or chills, chest pains palpitations, nausea , vomiting ,diarrhea .  Patient lives with his daughter who takes care of him, and he is with a home health agency who usually takes care of him and checks his weight and vitals every 2 weeks.  The daughter reports that she has been checking his weight and he had no increase in weight but she noticed that he has been having increased leg swelling lately.  In the emergency room the patient was noted to have elevated BNP.  His Lasix was stopped a few weeks ago due to syncopal episode.    Review of Systems    In addition to the HPI above,  No Fever-chills, No Headache,  No changes with Vision or hearing, No problems swallowing food or Liquids, No Chest pain,  No Abdominal pain, No Nausea or Vommitting, Bowel movements are regular, No Blood in stool or Urine, No dysuria, No new skin rashes or bruises, No new joints pains-aches,  No new weakness, tingling, numbness in any extremity, No recent weight gain or loss, No polyuria, polydypsia or polyphagia, No significant Mental Stressors.  A full 10 point Review of Systems was done, except as stated above, all other Review of Systems were negative.   Social History Social History   Tobacco Use  . Smoking status: Former Smoker    Packs/day: 1.00    Years: 10.00    Pack years: 10.00    Types: Cigarettes    Last attempt to quit: 12/26/1963    Years since quitting: 53.3  . Smokeless tobacco: Never Used  Substance Use Topics  . Alcohol use: No     Family History Family History  Problem Relation Age of Onset  . Heart disease Mother   . Other Father        Brain tumor  . Thyroid disease Father      Prior to Admission medications   Medication Sig Start Date End Date Taking? Authorizing Provider  aspirin EC 81 MG tablet Take 81 mg by mouth daily.   Yes [provider]  Cholecalciferol (VITAMIN D) 2000 units CAPS Take 2,000 Units by mouth daily.   Yes [provider]  cholecalciferol 5000 units TABS Take 5,000 Units every morning by mouth. 01/10/17  Yes Unk Pinto, MD  escitalopram (LEXAPRO) 10 MG tablet Take 10 mg by mouth every morning.    Yes [provider]  levothyroxine (SYNTHROID, LEVOTHROID) 75 MCG tablet Take 1 tablet (75 mcg total) by mouth daily. 05/26/15  Yes Forcucci, Courtney, PA-C  Omega-3 Fatty Acids (FISH OIL PO) Take 1 capsule by mouth daily.   Yes [provider]  QUEtiapine (SEROQUEL) 100 MG tablet Take 100 mg by mouth at bedtime.    Yes [provider]  mirtazapine (REMERON) 15 MG tablet Take 1 tablet at bedtime for  Appetite Patient not taking: Reported on 04/20/2017 01/22/17 04/24/17  Unk Pinto, MD    Allergies  Allergen Reactions  . Penicillins Hives, Itching, Swelling and Rash       . Penicillins Rash    Physical Exam  Vitals  Blood pressure (!) 138/105, pulse 71, temperature 98.6 F (37 C), temperature source Oral, resp. rate 17, height 5\' 8"  (1.727 m), weight 72.6 kg (160 lb), SpO2 97 %.   1. General well-developed, well-nourished male, extremely pleasant  2. Normal affect and insight, Not Suicidal or Homicidal, pleasantly confused and hard of hearing.  3. No F.N deficits, grossly, patient moving all extremities.  4. Ears and Eyes appear Normal, Conjunctivae clear, PERRLA. Moist Oral Mucosa.  5. Supple Neck, mild JVD.  6. Symmetrical Chest wall movement, Good air movement bilaterally, bilateral basilar crackles.  7. RRR, No Gallops, Rubs or Murmurs, No Parasternal Heave.  8. Positive Bowel Sounds, Abdomen Soft, Non tender, No organomegaly appriciated,No rebound -guarding or rigidity.  9.  No Cyanosis, Normal Skin Turgor, No Skin Rash or Bruise.  10. Good muscle tone,  joints appear normal , mild leg swelling.    Data Review  CBC Recent Labs  Lab 04/20/17 1454  WBC 6.4  HGB 12.8*  HCT 39.8  PLT 169  MCV 89.6  MCH 28.8  MCHC 32.2  RDW 17.4*   ------------------------------------------------------------------------------------------------------------------  Chemistries  Recent Labs  Lab 04/20/17 1454  NA 141  K 3.9  CL 110  CO2 23  GLUCOSE 113*  BUN 28*  CREATININE 1.46*  CALCIUM 9.5   ------------------------------------------------------------------------------------------------------------------ estimated creatinine clearance is 34.5 mL/min (A) (by C-G formula based on SCr of 1.46 mg/dL (H)). ------------------------------------------------------------------------------------------------------------------ No results for input(s): TSH, T4TOTAL,  T3FREE, THYROIDAB in the last 72 hours.  Invalid input(s): FREET3   Coagulation profile No results for input(s): INR, PROTIME in the last 168 hours. ------------------------------------------------------------------------------------------------------------------- No results for input(s): DDIMER in the last 72 hours. -------------------------------------------------------------------------------------------------------------------  Cardiac Enzymes No results for input(s): CKMB, TROPONINI, MYOGLOBIN in the last 168 hours.  Invalid input(s): CK ------------------------------------------------------------------------------------------------------------------ Invalid input(s): POCBNP   ---------------------------------------------------------------------------------------------------------------  Urinalysis    Component Value Date/Time   COLORURINE YELLOW 01/06/2017 [place] 01/06/2017 1835   LABSPEC 1.010 01/06/2017  [place] 6.0 01/06/2017 [place] 01/06/2017 [place] (A) 01/06/2017 [place] NEGATIVE 01/06/2017 [place] 01/06/2017 [place] 01/06/2017 1835   NITRITE NEGATIVE 01/06/2017 [place] 01/06/2017 1835    ----------------------------------------------------------------------------------------------------------------   Imaging results:   Dg Chest 2 View  Result Date: 04/20/2017 CLINICAL DATA:  84 year old male with history of recent weight gain and ankle swelling. Shortness of breath. History of congestive heart failure. EXAM: CHEST  2 VIEW COMPARISON:  Chest x-ray lymph FINDINGS: Lung volumes are low. Bibasilar opacities likely reflect areas of subsegmental atelectasis with superimposed small (right greater than left) bilateral pleural effusions. There is cephalization of the pulmonary vasculature and slight indistinctness of the interstitial markings  suggestive of mild pulmonary edema. Mild cardiomegaly upper mediastinal contours are within normal limits. Aortic atherosclerosis. To small surgical clips project over the lower right hemithorax. IMPRESSION: 1. Appearance the chest suggests mild congestive heart failure, as above. 2. Aortic atherosclerosis. Electronically Signed   By: Vinnie Langton M.D.   On: 04/20/2017 16:39    My personal review of EKG: Rhythm NSR, Rate 73 bpm, right bundle branch block and LPFB    Assessment & Plan  1.  Acute diastolic congestive heart failure, last echocardiogram 10/2016 with grade 1 diastolic      Patient was taken off his Lasix due to hypotension and syncope 2.  Acute urinary retention with a history of prostatic and bladder cancer      Place Foley/Flomax  Plan  Admit to telemetry IV Lasix Insert Foley catheter Weight patient daily Fluid restriction Check BMP in a.m.   DVT Prophylaxis Heparin  AM Labs Ordered, also please review Full Orders  Family Communication: Admission, patients condition and plan of care including tests being ordered have been discussed with the patient and daughter who indicate understanding and agree with the plan and Code Status.  Code Status DNR  Disposition Plan: Home with home health  Time spent in minutes : 49 minutes  Condition GUARDED   @SIGNATURE @

## 2017-04-20 NOTE — Progress Notes (Signed)
CRITICAL VALUE ALERT  Critical Value:  Troponin 0.08  Date & Time Notied:  04/20/2017 2059   Provider Notified: X. Blount  Orders Received/Actions taken: no new orders recieved

## 2017-04-21 DIAGNOSIS — I5043 Acute on chronic combined systolic (congestive) and diastolic (congestive) heart failure: Secondary | ICD-10-CM

## 2017-04-21 LAB — TROPONIN I
Troponin I: 0.07 ng/mL (ref <0.03)
Troponin I: 0.06 ng/mL (ref <0.03)

## 2017-04-21 LAB — BASIC METABOLIC PANEL
ANION GAP: 9 (ref 5–15)
BUN: 29 mg/dL — AB (ref 6–20)
CALCIUM: 9 mg/dL (ref 8.9–10.3)
CO2: 23 mmol/L (ref 22–32)
Chloride: 109 mmol/L (ref 101–111)
Creatinine, Ser: 1.28 mg/dL — ABNORMAL HIGH (ref 0.61–1.24)
GFR calc Af Amer: 56 mL/min — ABNORMAL LOW (ref >60)
GFR calc non Af Amer: 49 mL/min — ABNORMAL LOW (ref >60)
GLUCOSE: 113 mg/dL — AB (ref 65–99)
Potassium: 3.3 mmol/L — ABNORMAL LOW (ref 3.5–5.1)
Sodium: 141 mmol/L (ref 135–145)

## 2017-04-21 NOTE — Progress Notes (Signed)
PROGRESS NOTE    [name] [name]  XKG:[id number] DOB: 01-08-1928 DOA: 04/20/2017 PCP: Unk Pinto, MD    Brief Narrative:   84 y.o. male, with past medical history significant for diastolic congestive heart failure of Lasix prostatic CA, bladder CA status post bladder surgery and TURP presenting today with increased shortness of breath last few weeks.  Pt diagnosed with acute diastolic CHF  Assessment & Plan:   Active Problems:   CHF (congestive heart failure) (HCC) - continue current diuresis regimen with lasix 40 mg iv bid - agree with fluid restriction - Pt is > 1.4 L of fluid negative  Acute urinary retention with a history of prostatic and bladder cancer - continue Foley/Flomax    DVT prophylaxis:Heparin Code Status: DNR Family Communication: none at bedside. Disposition Plan: Pending improvement in condition, will obtain PT evaluation next am.   Consultants:   none   Procedures: None   Antimicrobials: None   Subjective: Pt has no new complaints. No acute issues overnight.  Objective: Vitals:   04/20/17 1935 04/20/17 2009 04/21/17 0431 04/21/17 1313  BP: (!) 141/87 (!) 148/96 (!) 145/97 126/70  Pulse: 65 67 65 65  Resp: 18 (!) 22 20 20   Temp:  98.5 F (36.9 C) 98.5 F (36.9 C) 98.1 F (36.7 C)  TempSrc:  Oral Axillary Oral  SpO2: 96% 100% 99% 100%  Weight:  72.2 kg (159 lb 2.8 oz)    Height:  5\' 8"  (1.727 m)      Intake/Output Summary (Last 24 hours) at 04/21/2017 1619 Last data filed at 04/21/2017 1307 Gross per 24 hour  Intake 353 ml  Output 770 ml  Net -417 ml   Filed Weights   04/20/17 1430 04/20/17 2009  Weight: 72.6 kg (160 lb) 72.2 kg (159 lb 2.8 oz)    Examination:  General exam: Appears calm and comfortable, in nad. Respiratory system: Clear to auscultation. Respiratory effort normal. Decreased breath sounds at bases. Cardiovascular system: S1 & S2 heard, RRR. No JVD, murmurs, rubs, gallops Gastrointestinal system: Abdomen  is nondistended, soft and nontender. No organomegaly or masses felt. Normal bowel sounds heard. Central nervous system: Alert and oriented. Hard of hearing Extremities: warm, no deformities Skin: No rashes, lesions or ulcers on limited exam. Psychiatry:  Mood & affect appropriate.     Data Reviewed: I have personally reviewed following labs and imaging studies  CBC: Recent Labs  Lab 04/20/17 1454  WBC 6.4  HGB 12.8*  HCT 39.8  MCV 89.6  PLT 702   Basic Metabolic Panel: Recent Labs  Lab 04/20/17 1454 04/21/17 0202  NA 141 141  K 3.9 3.3*  CL 110 109  CO2 23 23  GLUCOSE 113* 113*  BUN 28* 29*  CREATININE 1.46* 1.28*  CALCIUM 9.5 9.0   GFR: Estimated Creatinine Clearance: 39.3 mL/min (A) (by C-G formula based on SCr of 1.28 mg/dL (H)). Liver Function Tests: No results for input(s): AST, ALT, ALKPHOS, BILITOT, PROT, ALBUMIN in the last 168 hours. No results for input(s): LIPASE, AMYLASE in the last 168 hours. No results for input(s): AMMONIA in the last 168 hours. Coagulation Profile: No results for input(s): INR, PROTIME in the last 168 hours. Cardiac Enzymes: Recent Labs  Lab 04/20/17 2016 04/21/17 0202 04/21/17 0831  TROPONINI 0.08* 0.07* 0.06*   BNP (last 3 results) No results for input(s): PROBNP in the last 8760 hours. HbA1C: No results for input(s): HGBA1C in the last 72 hours. CBG: No results for input(s): GLUCAP in the  last 168 hours. Lipid Profile: No results for input(s): CHOL, HDL, LDLCALC, TRIG, CHOLHDL, LDLDIRECT in the last 72 hours. Thyroid Function Tests: Recent Labs    04/20/17 2016  TSH 3.044   Anemia Panel: No results for input(s): VITAMINB12, FOLATE, FERRITIN, TIBC, IRON, RETICCTPCT in the last 72 hours. Sepsis Labs: No results for input(s): PROCALCITON, LATICACIDVEN in the last 168 hours.  No results found for this or any previous visit (from the past 240 hour(s)).       Radiology Studies: Dg Chest 2 View  Result Date:  04/20/2017 CLINICAL DATA:  84 year old male with history of recent weight gain and ankle swelling. Shortness of breath. History of congestive heart failure. EXAM: CHEST  2 VIEW COMPARISON:  Chest x-ray lymph FINDINGS: Lung volumes are low. Bibasilar opacities likely reflect areas of subsegmental atelectasis with superimposed small (right greater than left) bilateral pleural effusions. There is cephalization of the pulmonary vasculature and slight indistinctness of the interstitial markings suggestive of mild pulmonary edema. Mild cardiomegaly upper mediastinal contours are within normal limits. Aortic atherosclerosis. To small surgical clips project over the lower right hemithorax. IMPRESSION: 1. Appearance the chest suggests mild congestive heart failure, as above. 2. Aortic atherosclerosis. Electronically Signed   By: [name] [name] M.D.   On: 04/20/2017 16:39     Scheduled Meds: . aspirin EC  81 mg Oral Daily  . cholecalciferol  5,000 Units Oral q morning - 10a  . escitalopram  10 mg Oral QAC breakfast  . furosemide  40 mg Intravenous BID  . heparin  5,000 Units Subcutaneous Q8H  . levothyroxine  75 mcg Oral QAC breakfast  . mouth rinse  15 mL Mouth Rinse BID  . QUEtiapine  50 mg Oral QHS  . sodium chloride flush  3 mL Intravenous Q12H  . tamsulosin  0.4 mg Oral Daily   Continuous Infusions: . sodium chloride       LOS: 1 day    Time spent: > 25 minutes    Velvet Bathe, MD [facility] Pager [phone]  If 7PM-7AM, please contact night-coverage [email or web address] Password [facility] 04/21/2017, 4:19 PM

## 2017-04-22 MED ORDER — POTASSIUM CHLORIDE ER 10 MEQ PO TBCR: 10.0000 meq | EXTENDED_RELEASE_TABLET | Freq: Every day | ORAL | 0 refills | Status: AC

## 2017-04-22 MED ORDER — TAMSULOSIN HCL 0.4 MG PO CAPS: 0.4000 mg | ORAL_CAPSULE | Freq: Every day | ORAL | 0 refills | Status: AC

## 2017-04-22 MED ORDER — FUROSEMIDE 20 MG PO TABS: 20.0000 mg | ORAL_TABLET | Freq: Every day | ORAL | 0 refills | Status: AC

## 2017-04-22 NOTE — Discharge Summary (Signed)
Physician Discharge Summary  [name] [name] FBP:[id number] DOB: [birth year] DOA: 04/20/2017  PCP: [name] Pinto, MD  Admit date: 04/20/2017 Discharge date: 04/22/2017  Time spent: > 35 minutes  Recommendations for Outpatient Follow-up:  1. Pt was diuresed and lost > 2 kg of water weight. 2. D/c on lasix and potassium replacement   Discharge Diagnoses:  Active Problems:   CHF (congestive heart failure) ([place])   Discharge Condition: stable  Diet recommendation: heart healthy  Filed Weights   04/20/17 1430 04/20/17 2009 04/22/17 0434  Weight: 72.6 kg (160 lb) 72.2 kg (159 lb 2.8 oz) 69.8 kg (153 lb 14.1 oz)    History of present illness:  84 y.o. male, with past medical history significant for diastolic congestive heart failure of Lasix prostatic CA, bladder CA status post bladder surgery and TURP presenting today with increased shortness of breath    Hospital Course:  Acute on chronic diastolic HF - improved after diuresis - will d/c home with lasix and potassium replacement - recommend f/u with primary care physician. Daughter reports she is looking at hospice as an option moving forward  Urinary retention - d/c on flomax - d/c foley on d/c  Procedures:  none  Consultations:  none  Discharge Exam: Vitals:   04/21/17 2155 04/22/17 0434  BP: 128/77 123/80  Pulse: 61 62  Resp: 20 20  Temp: 97.7 F (36.5 C) 97.9 F (36.6 C)  SpO2: 97% 90%    General: Pt in nad, alert and awake Cardiovascular: rrr, no rubs Respiratory: no increased wob, no wheezes, speaking in full sentences  Discharge Instructions   Discharge Instructions    Call MD for:  difficulty breathing, headache or visual disturbances   Complete by:  As directed    Call MD for:  extreme fatigue   Complete by:  As directed    Call MD for:  temperature >100.4   Complete by:  As directed    Diet - low sodium heart healthy   Complete by:  As directed    Discharge instructions   Complete by:   As directed    Please ensure follow up with your primary care physician within the next 1-2 weeks or sooner should any new concerns arise.   Increase activity slowly   Complete by:  As directed      Allergies as of 04/22/2017      Reactions   Penicillins Hives, Itching, Swelling, Rash      Penicillins Rash      Medication List    TAKE these medications   aspirin EC 81 MG tablet Take 81 mg by mouth daily.   escitalopram 10 MG tablet Commonly known as:  LEXAPRO Take 10 mg by mouth every morning.   FISH OIL PO Take 1 capsule by mouth daily.   furosemide 20 MG tablet Commonly known as:  LASIX Take 1 tablet (20 mg total) by mouth daily.   levothyroxine 75 MCG tablet Commonly known as:  SYNTHROID, LEVOTHROID Take 1 tablet (75 mcg total) by mouth daily.   mirtazapine 15 MG tablet Commonly known as:  REMERON Take 1 tablet at bedtime for Appetite   potassium chloride 10 MEQ tablet Commonly known as:  K-DUR Take 1 tablet (10 mEq total) by mouth daily.   QUEtiapine 100 MG tablet Commonly known as:  SEROQUEL Take 100 mg by mouth at bedtime.   tamsulosin 0.4 MG Caps capsule Commonly known as:  FLOMAX Take 1 capsule (0.4 mg total) by mouth daily. Start taking  on:  04/23/2017   Vitamin D 2000 units Caps Take 2,000 Units by mouth daily.   Vitamin D-3 5000 units Tabs Take 5,000 Units every morning by mouth.      Allergies  Allergen Reactions  . Penicillins Hives, Itching, Swelling and Rash       . Penicillins Rash      The results of significant diagnostics from this hospitalization (including imaging, microbiology, ancillary and laboratory) are listed below for reference.    Significant Diagnostic Studies: Dg Chest 2 View  Result Date: 04/20/2017 CLINICAL DATA:  84 year old male with history of recent weight gain and ankle swelling. Shortness of breath. History of congestive heart failure. EXAM: CHEST  2 VIEW COMPARISON:  Chest x-ray lymph FINDINGS: Lung  volumes are low. Bibasilar opacities likely reflect areas of subsegmental atelectasis with superimposed small (right greater than left) bilateral pleural effusions. There is cephalization of the pulmonary vasculature and slight indistinctness of the interstitial markings suggestive of mild pulmonary edema. Mild cardiomegaly upper mediastinal contours are within normal limits. Aortic atherosclerosis. To small surgical clips project over the lower right hemithorax. IMPRESSION: 1. Appearance the chest suggests mild congestive heart failure, as above. 2. Aortic atherosclerosis. Electronically Signed   By: Vinnie Langton M.D.   On: 04/20/2017 16:39    Microbiology: No results found for this or any previous visit (from the past 240 hour(s)).   Labs: Basic Metabolic Panel: Recent Labs  Lab 04/20/17 1454 04/21/17 0202  NA 141 141  K 3.9 3.3*  CL 110 109  CO2 23 23  GLUCOSE 113* 113*  BUN 28* 29*  CREATININE 1.46* 1.28*  CALCIUM 9.5 9.0   Liver Function Tests: No results for input(s): AST, ALT, ALKPHOS, BILITOT, PROT, ALBUMIN in the last 168 hours. No results for input(s): LIPASE, AMYLASE in the last 168 hours. No results for input(s): AMMONIA in the last 168 hours. CBC: Recent Labs  Lab 04/20/17 1454  WBC 6.4  HGB 12.8*  HCT 39.8  MCV 89.6  PLT 169   Cardiac Enzymes: Recent Labs  Lab 04/20/17 2016 04/21/17 0202 04/21/17 0831  TROPONINI 0.08* 0.07* 0.06*   BNP: BNP (last 3 results) Recent Labs    08/08/16 1655 01/05/17 2254 04/20/17 1454  BNP 486.3* 202.3* 2,042.2*    ProBNP (last 3 results) No results for input(s): PROBNP in the last 8760 hours.  CBG: No results for input(s): GLUCAP in the last 168 hours.     Signed:  Velvet Bathe MD.  [facility] 04/22/2017, 11:19 AM

## 2017-04-23 DIAGNOSIS — G301 Alzheimer's disease with late onset: Secondary | ICD-10-CM | POA: Diagnosis not present

## 2017-04-23 DIAGNOSIS — Z515 Encounter for palliative care: Secondary | ICD-10-CM | POA: Diagnosis not present

## 2017-04-23 DIAGNOSIS — I5042 Chronic combined systolic (congestive) and diastolic (congestive) heart failure: Secondary | ICD-10-CM | POA: Diagnosis not present

## 2017-04-23 DIAGNOSIS — I130 Hypertensive heart and chronic kidney disease with heart failure and stage 1 through stage 4 chronic kidney disease, or unspecified chronic kidney disease: Secondary | ICD-10-CM | POA: Diagnosis not present

## 2017-04-23 DIAGNOSIS — F0280 Dementia in other diseases classified elsewhere without behavioral disturbance: Secondary | ICD-10-CM | POA: Diagnosis not present

## 2017-04-23 DIAGNOSIS — N183 Chronic kidney disease, stage 3 (moderate): Secondary | ICD-10-CM | POA: Diagnosis not present

## 2017-04-25 NOTE — Progress Notes (Signed)
Hospital follow up  Assessment and Plan: Hospital visit follow up for : acute on chronic chf and urinary retension Hospital discharge meds were reviewed, and reconciled with the patient.   Diagnoses and all orders for this visit:  Acute on chronic combined systolic (congestive) and diastolic (congestive) heart failure (HCC) Resolved/stable - no signs of fluid overload today on exam, but patient continues with dyspnea on minimal exertion -  Continue with lasix 40 mg daily, previously treated up to 80 mg - discussed may titrate up for tighter control pending lab results -     BASIC METABOLIC PANEL WITH GFR  Urinary retention       Has not filled tamsulosin; will arrange to fill this through [facility] per patient request       Is producing sufficient urine, with ?incomplete bladder emptying, reports frequency   CKD (chronic kidney disease) stage 3, GFR 30-59 ml/min (HCC) -     BASIC METABOLIC PANEL WITH GFR  SDAT (senile dementia of Alzheimer's type)      Proceeding with hospice care; goals of care and expectations discussed at length today.       Patient and daughter both express wishes to be treated as much as possible in the home, prefer to not return to hospital. Patient expresses wishes today to be cared for at home, but pass away in hospice facility.   Medications Discontinued During This Encounter  Medication Reason  . mirtazapine (REMERON) 15 MG tablet Patient has not taken in last 30 days   Over 40 minutes of exam, counseling, chart review, and complex, high/moderate level critical decision making was performed this visit.   Future Appointments  Date Time Provider [place]  05/30/2017 11:00 AM Unk Pinto, MD [facility] None    HPI 84 y.o.male with moderate dementia presents accompanied by his daughter for follow up for transition from recent hospitalization. Admit date to the hospital was 04/20/17, patient was discharged from the hospital on 04/22/17 and our clinical  staff contacted the office the day after discharge to set up a follow up appointment. The discharge summary, medications, and diagnostic test results were reviewed before meeting with the patient. The patient was admitted for: acute on chronic CHF and urinary retention. Patient presented on 04/20/2017 with c/o increased SOB - he was diuresed aggressively and lost > 2 kg of water weight, foley was placed while hospitalized for urinary retention and he has been discharged on lasix 20 mg daily, potassium replacement, and flomax.    His daughter discusses he has been pursuing palliative care in the past several months, is now discussing progression to hospice care secondary to dementia. Daughter reports she has initial appointment with [facility] next Tuesday; this has been discussed in the context of recent recurrent hospitalizations for CHF exacerbations, progressing dementia and desire for quality of life focused care.   He is followed by Dr. Gwenlyn Found for cardiovascular conditions. Daughter presents with him today and reports weights have been 154# the day after discharge, 156# Wednesday, today 157# He has a history of Combined Systolic and Diastolic, denies orthopnea, paroxysmal nocturnal dyspnea and edema. Positive for dyspnea and fatigue with exertion. Continues with lasix 40 mg daily after discharge per recommendation.  Wt Readings from Last 3 Encounters:  04/26/17 159 lb (72.1 kg)  04/22/17 153 lb 14.1 oz (69.8 kg)  02/22/17 161 lb (73 kg)   Most recent ECHO 10/16/2016 reviewed:  Left ventricle: The cavity size was normal. Wall thickness was  increased in a pattern of moderate LVH. There was focal basal   hypertrophy. Doppler parameters are consistent with abnormal left   ventricular relaxation (grade 1 diastolic dysfunction). - Aortic valve: There was mild regurgitation. - Mitral valve: There was mild to moderate regurgitation. - Left atrium: The atrium was moderately dilated. -  Pulmonic valve: There was moderate regurgitation. - Pulmonary arteries: Systolic pressure was mildly increased. PA   peak pressure: 32 mm Hg (S).  Last BMP prior to discharge was as follows:   Component     Latest Ref Rng & Units 04/21/2017  Sodium     135 - 145 mmol/L 141  Potassium     3.5 - 5.1 mmol/L 3.3 (L)  Chloride     101 - 111 mmol/L 109  CO2     22 - 32 mmol/L 23  Glucose     65 - 99 mg/dL 113 (H)  BUN     6 - 20 mg/dL 29 (H)  Creatinine     0.61 - 1.24 mg/dL 1.28 (H)  Calcium     8.9 - 10.3 mg/dL 9.0  GFR, Est Non African American     >60 mL/min 49 (L)  GFR, Est African American     >60 mL/min 56 (L)  Anion gap     5 - 15 9    Home health is involved.   Images while in the hospital: Dg Chest 2 View  Result Date: 04/20/2017 CLINICAL DATA:  84 year old male with history of recent weight gain and ankle swelling. Shortness of breath. History of congestive heart failure. EXAM: CHEST  2 VIEW COMPARISON:  Chest x-ray lymph FINDINGS: Lung volumes are low. Bibasilar opacities likely reflect areas of subsegmental atelectasis with superimposed small (right greater than left) bilateral pleural effusions. There is cephalization of the pulmonary vasculature and slight indistinctness of the interstitial markings suggestive of mild pulmonary edema. Mild cardiomegaly upper mediastinal contours are within normal limits. Aortic atherosclerosis. To small surgical clips project over the lower right hemithorax. IMPRESSION: 1. Appearance the chest suggests mild congestive heart failure, as above. 2. Aortic atherosclerosis. Electronically Signed   By: Vinnie Langton M.D.   On: 04/20/2017 16:39    Past Medical History:  Diagnosis Date  . Bladder cancer ([place])   . Dementia   . Depression   . HTN (hypertension)    off medications  . HTN (hypertension)   . Hypothyroid   . Hypothyroidism   . Other testicular hypofunction   . Prediabetes   . Prostate cancer ([place])   . Stroke ([place])  1980  . Stroke ([place])   . Vitamin D deficiency      Allergies  Allergen Reactions  . Penicillins Hives, Itching, Swelling and Rash       . Penicillins Rash      Current Outpatient Medications on File Prior to Visit  Medication Sig Dispense Refill  . aspirin EC 81 MG tablet Take 81 mg by mouth daily.    . Cholecalciferol (VITAMIN D) 2000 units CAPS Take 2,000 Units by mouth daily.    . cholecalciferol 5000 units TABS Take 5,000 Units every morning by mouth. 1 tablet   . escitalopram (LEXAPRO) 10 MG tablet Take 10 mg by mouth every morning.     . furosemide (LASIX) 20 MG tablet Take 1 tablet (20 mg total) by mouth daily. (Patient taking differently: Take 40 mg by mouth daily. ) 30 tablet 0  . levothyroxine (SYNTHROID, LEVOTHROID) 75 MCG tablet Take 1  tablet (75 mcg total) by mouth daily. 90 tablet 1  . Omega-3 Fatty Acids (FISH OIL PO) Take 1 capsule by mouth daily.    . potassium chloride (K-DUR) 10 MEQ tablet Take 1 tablet (10 mEq total) by mouth daily. 30 tablet 0  . QUEtiapine (SEROQUEL) 100 MG tablet Take 100 mg by mouth at bedtime.     . tamsulosin (FLOMAX) 0.4 MG CAPS capsule Take 1 capsule (0.4 mg total) by mouth daily. (Patient not taking: Reported on 04/26/2017) 30 capsule 0   No current facility-administered medications on file prior to visit.     ROS: all negative except above.   Physical Exam: Filed Weights   04/26/17 1351  Weight: 159 lb (72.1 kg)   BP 122/76   Pulse 69   Temp (!) 97.5 F (36.4 C)   Ht 5' 6.5" (1.689 m)   Wt 159 lb (72.1 kg)   SpO2 98%   BMI 25.28 kg/m  General Appearance: Well nourished, in no apparent distress. Eyes: PERRLA, EOMs, conjunctiva no swelling or erythema ENT/Mouth:  No erythema, swelling, or exudate on post pharynx.  Tonsils not swollen or erythematous. Hearing normal.  Neck: Supple.  Respiratory: Respiratory effort normal, BS equal bilaterally without rales, rhonchi, wheezing or stridor.  Cardio: RRR with no MRGs.  Symmetrical peripheral pulses without notable edema.  Abdomen: Soft, + BS.  Non tender, no guarding, rebound, hernias, masses. Lymphatics: Non tender without lymphadenopathy.  Musculoskeletal: Wheelchair bound - no obvious deformity  Skin: Warm, dry without rashes, lesions, ecchymosis.  Psych: Awake and oriented X 2, normal affect. Poor judgement and recall.    Izora Ribas, NP 2:18 PM [facility]

## 2017-04-26 ENCOUNTER — Encounter: Payer: Self-pay | Admitting: Adult Health

## 2017-04-26 ENCOUNTER — Ambulatory Visit (INDEPENDENT_AMBULATORY_CARE_PROVIDER_SITE_OTHER): Payer: Medicare Other | Admitting: Adult Health

## 2017-04-26 VITALS — BP 122/76 | HR 69 | Temp 97.5°F | Ht 66.5 in | Wt 159.0 lb

## 2017-04-26 DIAGNOSIS — I5043 Acute on chronic combined systolic (congestive) and diastolic (congestive) heart failure: Secondary | ICD-10-CM

## 2017-04-26 DIAGNOSIS — N183 Chronic kidney disease, stage 3 unspecified: Secondary | ICD-10-CM

## 2017-04-26 DIAGNOSIS — F0280 Dementia in other diseases classified elsewhere without behavioral disturbance: Secondary | ICD-10-CM

## 2017-04-26 DIAGNOSIS — R339 Retention of urine, unspecified: Secondary | ICD-10-CM | POA: Diagnosis not present

## 2017-04-26 DIAGNOSIS — G301 Alzheimer's disease with late onset: Secondary | ICD-10-CM | POA: Diagnosis not present

## 2017-04-26 NOTE — Patient Instructions (Signed)
Hospice  Introduction  Hospice is a service that is designed to provide people who are terminally ill and their families with medical, spiritual, and psychological support. Its aim is to improve your quality of life by keeping you as alert and comfortable as possible.  Who will be my providers when I begin hospice care?  Hospice teams often include:   A nurse.   A doctor. The hospice doctor will be available for your care, but you can bring your regular doctor or nurse practitioner.   Social workers.   Religious leaders (such as a chaplain).   Trained volunteers.    What roles will providers play in my care?  Hospice is performed by a team of health care professionals and volunteers who:   Help keep you comfortable:  ? Hospice can be provided in your home or in a homelike setting.  ? The hospice staff works with your family and friends to help meet your needs.  ? You will enjoy the support of loved ones by receiving much of your basic care from family and friends.   Provide pain relief and manage your symptoms. The staff supply all necessary medicines and equipment.   Provide companionship when you are alone.   Allow you and your family to rest. They may do light housekeeping, prepare meals, and run errands.   Provide counseling. They will make sure your emotional, spiritual, and social needs and those of your family are being met.   Provide spiritual care:  ? Spiritual care will be individualized to meet your needs and your family's needs.  ? Spiritual care may involve:   Helping you look at what death means to you.   Helping you say goodbye to your family and friends.   Performing a specific religious ceremony or ritual.    When should hospice care begin?  Most people who use hospice are believed to have fewer than 6 months to live.   Your family and health care providers can help you decide when hospice services should begin.   If your condition improves, you may discontinue the program.    What  should I consider before selecting a program?  Most hospice programs are run by nonprofit, independent organizations. Some are affiliated with hospitals, nursing homes, or home health care agencies. Hospice programs can take place in the home or at a hospice center, hospital, or skilled nursing facility. When choosing a hospice program, ask the following questions:   What services are available to me?   What services will be offered to my loved ones?   How involved will my loved ones be?   How involved will my health care provider be?   Who makes up the hospice care team? How are they trained or screened?   How will my pain and symptoms be managed?   If my circumstances change, can the services be provided in a different setting, such as my home or in the hospital?   Is the program reviewed and licensed by the state or certified in some other way?    Where can I learn more about hospice?  You can learn about existing hospice programs in your area from your health care providers. You can also read more about hospice online. The websites of the following organizations contain helpful information:   The National Hospice and Palliative Care Organization (NHPCO).   The Hospice Association of America (HAA).   The Hospice Education Institute.   The American Cancer Society (ACS).   

## 2017-04-27 LAB — BASIC METABOLIC PANEL WITH GFR
BUN / CREAT RATIO: 22 (calc) (ref 6–22)
BUN: 26 mg/dL — AB (ref 7–25)
CHLORIDE: 100 mmol/L (ref 98–110)
CO2: 26 mmol/L (ref 20–32)
Calcium: 9.7 mg/dL (ref 8.6–10.3)
Creat: 1.18 mg/dL — ABNORMAL HIGH (ref 0.70–1.11)
GFR, EST AFRICAN AMERICAN: 64 mL/min/{1.73_m2} (ref >=60)
GFR, Est Non African American: 55 mL/min/{1.73_m2} — ABNORMAL LOW (ref >=60)
GLUCOSE: 89 mg/dL (ref 65–99)
POTASSIUM: 4.2 mmol/L (ref 3.5–5.3)
Sodium: 138 mmol/L (ref 135–146)

## 2017-05-01 DIAGNOSIS — Z8551 Personal history of malignant neoplasm of bladder: Secondary | ICD-10-CM | POA: Diagnosis not present

## 2017-05-01 DIAGNOSIS — Z8546 Personal history of malignant neoplasm of prostate: Secondary | ICD-10-CM | POA: Diagnosis not present

## 2017-05-01 DIAGNOSIS — I313 Pericardial effusion (noninflammatory): Secondary | ICD-10-CM | POA: Diagnosis not present

## 2017-05-01 DIAGNOSIS — G301 Alzheimer's disease with late onset: Secondary | ICD-10-CM | POA: Diagnosis not present

## 2017-05-01 DIAGNOSIS — F0280 Dementia in other diseases classified elsewhere without behavioral disturbance: Secondary | ICD-10-CM | POA: Diagnosis not present

## 2017-05-01 DIAGNOSIS — I130 Hypertensive heart and chronic kidney disease with heart failure and stage 1 through stage 4 chronic kidney disease, or unspecified chronic kidney disease: Secondary | ICD-10-CM | POA: Diagnosis not present

## 2017-05-01 DIAGNOSIS — N183 Chronic kidney disease, stage 3 (moderate): Secondary | ICD-10-CM | POA: Diagnosis not present

## 2017-05-01 DIAGNOSIS — D631 Anemia in chronic kidney disease: Secondary | ICD-10-CM | POA: Diagnosis not present

## 2017-05-01 DIAGNOSIS — I5042 Chronic combined systolic (congestive) and diastolic (congestive) heart failure: Secondary | ICD-10-CM | POA: Diagnosis not present

## 2017-05-03 DIAGNOSIS — I5042 Chronic combined systolic (congestive) and diastolic (congestive) heart failure: Secondary | ICD-10-CM | POA: Diagnosis not present

## 2017-05-03 DIAGNOSIS — G301 Alzheimer's disease with late onset: Secondary | ICD-10-CM | POA: Diagnosis not present

## 2017-05-03 DIAGNOSIS — I130 Hypertensive heart and chronic kidney disease with heart failure and stage 1 through stage 4 chronic kidney disease, or unspecified chronic kidney disease: Secondary | ICD-10-CM | POA: Diagnosis not present

## 2017-05-03 DIAGNOSIS — I313 Pericardial effusion (noninflammatory): Secondary | ICD-10-CM | POA: Diagnosis not present

## 2017-05-03 DIAGNOSIS — D631 Anemia in chronic kidney disease: Secondary | ICD-10-CM | POA: Diagnosis not present

## 2017-05-03 DIAGNOSIS — N183 Chronic kidney disease, stage 3 (moderate): Secondary | ICD-10-CM | POA: Diagnosis not present

## 2017-05-04 DIAGNOSIS — Z8551 Personal history of malignant neoplasm of bladder: Secondary | ICD-10-CM | POA: Diagnosis not present

## 2017-05-04 DIAGNOSIS — I130 Hypertensive heart and chronic kidney disease with heart failure and stage 1 through stage 4 chronic kidney disease, or unspecified chronic kidney disease: Secondary | ICD-10-CM | POA: Diagnosis not present

## 2017-05-04 DIAGNOSIS — I5042 Chronic combined systolic (congestive) and diastolic (congestive) heart failure: Secondary | ICD-10-CM | POA: Diagnosis not present

## 2017-05-04 DIAGNOSIS — N183 Chronic kidney disease, stage 3 (moderate): Secondary | ICD-10-CM | POA: Diagnosis not present

## 2017-05-04 DIAGNOSIS — D631 Anemia in chronic kidney disease: Secondary | ICD-10-CM | POA: Diagnosis not present

## 2017-05-04 DIAGNOSIS — Z8546 Personal history of malignant neoplasm of prostate: Secondary | ICD-10-CM | POA: Diagnosis not present

## 2017-05-04 DIAGNOSIS — I313 Pericardial effusion (noninflammatory): Secondary | ICD-10-CM | POA: Diagnosis not present

## 2017-05-04 DIAGNOSIS — F0280 Dementia in other diseases classified elsewhere without behavioral disturbance: Secondary | ICD-10-CM | POA: Diagnosis not present

## 2017-05-04 DIAGNOSIS — G301 Alzheimer's disease with late onset: Secondary | ICD-10-CM | POA: Diagnosis not present

## 2017-05-08 DIAGNOSIS — N183 Chronic kidney disease, stage 3 (moderate): Secondary | ICD-10-CM | POA: Diagnosis not present

## 2017-05-08 DIAGNOSIS — I313 Pericardial effusion (noninflammatory): Secondary | ICD-10-CM | POA: Diagnosis not present

## 2017-05-08 DIAGNOSIS — D631 Anemia in chronic kidney disease: Secondary | ICD-10-CM | POA: Diagnosis not present

## 2017-05-08 DIAGNOSIS — I130 Hypertensive heart and chronic kidney disease with heart failure and stage 1 through stage 4 chronic kidney disease, or unspecified chronic kidney disease: Secondary | ICD-10-CM | POA: Diagnosis not present

## 2017-05-08 DIAGNOSIS — G301 Alzheimer's disease with late onset: Secondary | ICD-10-CM | POA: Diagnosis not present

## 2017-05-08 DIAGNOSIS — I5042 Chronic combined systolic (congestive) and diastolic (congestive) heart failure: Secondary | ICD-10-CM | POA: Diagnosis not present

## 2017-05-11 DIAGNOSIS — D631 Anemia in chronic kidney disease: Secondary | ICD-10-CM | POA: Diagnosis not present

## 2017-05-11 DIAGNOSIS — G301 Alzheimer's disease with late onset: Secondary | ICD-10-CM | POA: Diagnosis not present

## 2017-05-11 DIAGNOSIS — I130 Hypertensive heart and chronic kidney disease with heart failure and stage 1 through stage 4 chronic kidney disease, or unspecified chronic kidney disease: Secondary | ICD-10-CM | POA: Diagnosis not present

## 2017-05-11 DIAGNOSIS — N183 Chronic kidney disease, stage 3 (moderate): Secondary | ICD-10-CM | POA: Diagnosis not present

## 2017-05-11 DIAGNOSIS — I313 Pericardial effusion (noninflammatory): Secondary | ICD-10-CM | POA: Diagnosis not present

## 2017-05-11 DIAGNOSIS — I5042 Chronic combined systolic (congestive) and diastolic (congestive) heart failure: Secondary | ICD-10-CM | POA: Diagnosis not present

## 2017-05-16 DIAGNOSIS — G301 Alzheimer's disease with late onset: Secondary | ICD-10-CM | POA: Diagnosis not present

## 2017-05-16 DIAGNOSIS — N183 Chronic kidney disease, stage 3 (moderate): Secondary | ICD-10-CM | POA: Diagnosis not present

## 2017-05-16 DIAGNOSIS — D631 Anemia in chronic kidney disease: Secondary | ICD-10-CM | POA: Diagnosis not present

## 2017-05-16 DIAGNOSIS — I5042 Chronic combined systolic (congestive) and diastolic (congestive) heart failure: Secondary | ICD-10-CM | POA: Diagnosis not present

## 2017-05-16 DIAGNOSIS — I313 Pericardial effusion (noninflammatory): Secondary | ICD-10-CM | POA: Diagnosis not present

## 2017-05-16 DIAGNOSIS — I130 Hypertensive heart and chronic kidney disease with heart failure and stage 1 through stage 4 chronic kidney disease, or unspecified chronic kidney disease: Secondary | ICD-10-CM | POA: Diagnosis not present

## 2017-05-22 DIAGNOSIS — D631 Anemia in chronic kidney disease: Secondary | ICD-10-CM | POA: Diagnosis not present

## 2017-05-22 DIAGNOSIS — N183 Chronic kidney disease, stage 3 (moderate): Secondary | ICD-10-CM | POA: Diagnosis not present

## 2017-05-22 DIAGNOSIS — I313 Pericardial effusion (noninflammatory): Secondary | ICD-10-CM | POA: Diagnosis not present

## 2017-05-22 DIAGNOSIS — I5042 Chronic combined systolic (congestive) and diastolic (congestive) heart failure: Secondary | ICD-10-CM | POA: Diagnosis not present

## 2017-05-22 DIAGNOSIS — G301 Alzheimer's disease with late onset: Secondary | ICD-10-CM | POA: Diagnosis not present

## 2017-05-22 DIAGNOSIS — I130 Hypertensive heart and chronic kidney disease with heart failure and stage 1 through stage 4 chronic kidney disease, or unspecified chronic kidney disease: Secondary | ICD-10-CM | POA: Diagnosis not present

## 2017-05-23 DIAGNOSIS — I5042 Chronic combined systolic (congestive) and diastolic (congestive) heart failure: Secondary | ICD-10-CM | POA: Diagnosis not present

## 2017-05-23 DIAGNOSIS — G301 Alzheimer's disease with late onset: Secondary | ICD-10-CM | POA: Diagnosis not present

## 2017-05-23 DIAGNOSIS — D631 Anemia in chronic kidney disease: Secondary | ICD-10-CM | POA: Diagnosis not present

## 2017-05-23 DIAGNOSIS — N183 Chronic kidney disease, stage 3 (moderate): Secondary | ICD-10-CM | POA: Diagnosis not present

## 2017-05-23 DIAGNOSIS — I313 Pericardial effusion (noninflammatory): Secondary | ICD-10-CM | POA: Diagnosis not present

## 2017-05-23 DIAGNOSIS — I130 Hypertensive heart and chronic kidney disease with heart failure and stage 1 through stage 4 chronic kidney disease, or unspecified chronic kidney disease: Secondary | ICD-10-CM | POA: Diagnosis not present

## 2017-05-24 DIAGNOSIS — C61 Malignant neoplasm of prostate: Secondary | ICD-10-CM | POA: Diagnosis not present

## 2017-05-29 NOTE — Progress Notes (Signed)
[facility]   Unk Pinto, M.D.     Uvaldo Bristle. Silverio Lay, P.A.-C Liane Comber, [place]                [address] [place], [place] [address] Telephone [phone] Telefax [phone]  Comprehensive Evaluation & Examination     This very nice 84 y.o. [facility] presents for a  comprehensive evaluation and management of multiple medical co-morbidities.  Patient has been followed for HTN, HLD, ASHD, CKD3, Prediabetes and Vitamin D Deficiency. Other problems include hx/o Prostate and Bladder cancer. Patient also has Moderately Severe Dementia. Patient resides with his caretaker daughter, Beverlee Nims. They also have a personal care assistant named "Kennyth Lose" to assist in his care. Patient apparently require much prompting & assistance in is ADL's.      HTN predates since 67. Patient's BP has been controlled at home.  Today's BP is at goal - 126/84.  Patient was recently hospitalized Feb 15-17 with CHF and was diuresed with prompt improvement and discharged for out-patient f/u.  Thereafter, he was admitted to Hospice for a primary diagnosis of Heart failure. He also had been hospitalized in 2017 with Heart Failure. Aggressive cardiac w/u was deferred due to his Dementia. Patient denies any cardiac symptoms as chest pain, palpitations, shortness of breath, dizziness or ankle swelling.     Patient's hyperlipidemia is controlled with diet and medications. Patient denies myalgias or other medication SE's. Last lipids were not at goal: Lab Results  Component Value Date   CHOL 192 02/22/2017   HDL 46 02/22/2017   LDLCALC 121 (H) 02/22/2017   TRIG 141 02/22/2017   CHOLHDL 4.2 02/22/2017      Patient has prediabetes (A1c 5.8%/2011)  and patient denies reactive hypoglycemic symptoms, visual blurring, diabetic polys or paresthesias. Since weight loss his last A1c was Normal & at goal. Lab Results  Component Value Date   HGBA1C 5.4 11/10/2016       Patient is hypothyroid & has been on thyroid replacement circa 2004. Finally, patient has history of Vitamin D Deficiency ("25"/2008) and last vitamin D was improved: Lab Results  Component Value Date   VD25OH 47 11/10/2016   Current Outpatient Medications on File Prior to Visit  Medication Sig  . aspirin EC 81 MG tablet Take 81 mg by mouth daily.  . Cholecalciferol (VITAMIN D) 2000 units CAPS Take 2,000 Units by mouth daily.  . cholecalciferol 5000 units TABS Take 5,000 Units every morning by mouth.  . escitalopram (LEXAPRO) 10 MG tablet Take 10 mg by mouth every morning.   . furosemide (LASIX) 20 MG tablet Take 1 tablet (20 mg total) by mouth daily. (Patient taking differently: Take 40 mg by mouth daily. )  . levothyroxine (SYNTHROID, LEVOTHROID) 75 MCG tablet Take 1 tablet (75 mcg total) by mouth daily.  . Omega-3 Fatty Acids (FISH OIL PO) Take 1 capsule by mouth daily.  . potassium chloride (K-DUR) 10 MEQ tablet Take 1 tablet (10 mEq total) by mouth daily.  . QUEtiapine (SEROQUEL) 100 MG tablet Take 100 mg by mouth at bedtime.   . tamsulosin (FLOMAX) 0.4 MG CAPS capsule Take 1 capsule (0.4 mg total) by mouth daily.   No current facility-administered medications on file prior to visit.    Allergies  Allergen Reactions  . Penicillins Hives, Itching, Swelling and Rash   Past Medical History:  Diagnosis  Date  . Bladder cancer ([place])   . Dementia   . Depression   . HTN (hypertension)    off medications  . HTN (hypertension)   . Hypothyroid   . Hypothyroidism   . Other testicular hypofunction   . Prediabetes   . Prostate cancer ([place])   . Stroke ([place]) 1980  . Stroke ([place])   . Vitamin D deficiency    Health Maintenance  Topic Date Due  . INFLUENZA VACCINE  Completed  . PNA vac Low Risk Adult  Completed  . TETANUS/TDAP  Discontinued   Immunization History  Administered Date(s) Administered  . Influenza Split 12/18/2012  . Influenza, High Dose  Seasonal PF 02/22/2015, 01/04/2017  . Influenza,inj,quad, With Preservative 01/12/2016  . Pneumococcal Conjugate-13 02/22/2015  . Pneumococcal Polysaccharide-23 03/06/2006  . Td 03/06/2004   Last Colon -  Past Surgical History:  Procedure Laterality Date  . BIOPSY PROSTATE  2/99   pos for adenocarcinoma of prostate  . BLADDER SURGERY    . CYSTOURETHROSCOPY  7/99   and TUR of bladder cancer  . PROSTATE SURGERY  2007  . PROSTATE SURGERY     Family History  Problem Relation Age of Onset  . Heart disease Mother   . Other Father        Brain tumor  . Thyroid disease Father    Social History   Socioeconomic History  . Marital status: Widowed  . Number of children: 1 daughter - Beverlee Nims - who is caretaker.  Occupational History  . Occupation: retired  Tobacco Use  . Smoking status: Former Smoker    Packs/day: 1.00    Years: 10.00    Pack years: 10.00    Types: Cigarettes    Last attempt to quit: 12/26/1963    Years since quitting: 53.4  . Smokeless tobacco: Never Used  Substance and Sexual Activity  . Alcohol use: No  . Drug use: No  . Sexual activity: No    ROS Constitutional: Denies fever, chills, weight loss/gain, headaches, insomnia,  night sweats or change in appetite. Does c/o fatigue. Eyes: Denies redness, blurred vision, diplopia, discharge, itchy or watery eyes.  ENT: Denies discharge, congestion, post nasal drip, epistaxis, sore throat, earache, hearing loss, dental pain, Tinnitus, Vertigo, Sinus pain or snoring.  Cardio: Denies chest pain, palpitations, irregular heartbeat, syncope, dyspnea, diaphoresis, orthopnea, PND, claudication or edema Respiratory: denies cough, dyspnea, DOE, pleurisy, hoarseness, laryngitis or wheezing.  Gastrointestinal: Denies dysphagia, heartburn, reflux, water brash, pain, cramps, nausea, vomiting, bloating, diarrhea, constipation, hematemesis, melena, hematochezia, jaundice or hemorrhoids Genitourinary: Denies dysuria, frequency,  urgency, nocturia, hesitancy, discharge, hematuria or flank pain Musculoskeletal: Denies arthralgia, myalgia, stiffness, Jt. Swelling, pain, limp or strain/sprain. Denies Falls. Skin: Denies puritis, rash, hives, warts, acne, eczema or change in skin lesion Neuro: No weakness, tremor, incoordination, spasms, paresthesia or pain Psychiatric: Denies confusion, memory loss or sensory loss. Denies Depression. Endocrine: Denies change in weight, skin, hair change, nocturia, and paresthesia, diabetic polys, visual blurring or hyper / hypo glycemic episodes.  Heme/Lymph: No excessive bleeding, bruising or enlarged lymph nodes.  Physical Exam  BP 126/84   Pulse 76   Temp (!) 97.1 F (36.2 C)   Resp (!) 24   Ht 5' 6.5" (1.689 m)   Wt 154 lb 12.8 oz (70.2 kg)   BMI 24.61 kg/m   General Appearance: Well nourished and well groomed and in no apparent distress.  Eyes: PERRLA, EOMs, conjunctiva no swelling or erythema, normal fundi and vessels. Sinuses: No frontal/maxillary  tenderness ENT/Mouth: EACs patent / TMs  nl. Nares clear without erythema, swelling, mucoid exudates. Oral hygiene is good. No erythema, swelling, or exudate. Tongue normal, non-obstructing. Tonsils not swollen or erythematous. Hearing normal.  Neck: Supple, thyroid not palpable. No bruits, nodes or JVD. Respiratory: Respiratory effort normal.  BS equal and clear bilateral without rales, rhonci, wheezing or stridor. Cardio: Heart sounds are normal with regular rate and rhythm and no murmurs, rubs or gallops. Peripheral pulses are normal and equal bilaterally without edema. No aortic or femoral bruits. Chest: symmetric with normal excursions and percussion.  Abdomen: Soft, with Nl bowel sounds. Nontender, no guarding, rebound, hernias, masses, or organomegaly.  Lymphatics: Non tender without lymphadenopathy.  Genitourinary:  DRE - deferred for age. Musculoskeletal: Full ROM with generalized decrease in muscle mass , power and  tone. Sl broadbase slow shuffling gait.  gait. Skin: Warm and dry without rashes, lesions, cyanosis, clubbing or  ecchymosis.  Neuro: Cranial nerves intact, reflexes equal bilaterally. Normal muscle tone, no cerebellar symptoms. Sensation intact. (+) snout & palmo-mental reflexes.  Pysch: Alert and oriented X 2 with flat affect with very limited insight and judgment and poor short term recall.   Assessment and Plan  1. Essential hypertension  - EKG 12-Lead - Urinalysis, Routine w reflex microscopic - CBC with Differential/Platelet - BASIC METABOLIC PANEL WITH GFR - Hepatic function panel - TSH  2. Hyperlipidemia, mixed  - EKG 12-Lead  3. Abnormal glucose  - Hemoglobin A1c  4. Vitamin D deficiency   5. CKD (chronic kidney disease) stage 3, GFR 30-59 ml/min (HCC)  - BASIC METABOLIC PANEL WITH GFR  6. SDAT (senile dementia of Alzheimer's type)   7. Hypothyroidism  - TSH  8. History of prostate cancer   9. History of bladder cancer  - Urinalysis, Routine w reflex microscopic  10. Medication management  - Urinalysis, Routine w reflex microscopic - CBC with Differential/Platelet - BASIC METABOLIC PANEL WITH GFR - Hepatic function panel - TSH - Hemoglobin A1c  11. Screening for ischemic heart disease  - EKG 12-Lead  12. Encounter for colorectal cancer screening  - POC Hemoccult Bld/Stl            Patient was counseled in prudent diet, weight control to achieve/maintain BMI less than 25, BP monitoring, regular exercise and medications as discussed.  Discussed med effects and SE's. Routine screening labs and tests as requested with regular follow-up as recommended. Over 40 minutes of exam, counseling, chart review and high complex critical decision making was performed

## 2017-05-29 NOTE — Patient Instructions (Signed)
Preventive Care for Adults  A healthy lifestyle and preventive care can promote health and wellness. Preventive health guidelines for men include the following key practices:  A routine yearly physical is a good way to check with your health care provider about your health and preventative screening. It is a chance to share any concerns and updates on your health and to receive a thorough exam.  Visit your dentist for a routine exam and preventative care every 6 months. Brush your teeth twice a day and floss once a day. Good oral hygiene prevents tooth decay and gum disease.  The frequency of eye exams is based on your age, health, family medical history, use of contact lenses, and other factors. Follow your health care provider's recommendations for frequency of eye exams.  Eat a healthy diet. Foods such as vegetables, fruits, whole grains, low-fat dairy products, and lean protein foods contain the nutrients you need without too many calories. Decrease your intake of foods high in solid fats, added sugars, and salt. Eat the right amount of calories for you.Get information about a proper diet from your health care provider, if necessary.  Regular physical exercise is one of the most important things you can do for your health. Most adults should get at least 150 minutes of moderate-intensity exercise (any activity that increases your heart rate and causes you to sweat) each week. In addition, most adults need muscle-strengthening exercises on 2 or more days a week.  Maintain a healthy weight. The body mass index (BMI) is a screening tool to identify possible weight problems. It provides an estimate of body fat based on height and weight. Your health care provider can find your BMI and can help you achieve or maintain a healthy weight.For adults 20 years and older:  A BMI below 18.5 is considered underweight.  A BMI of 18.5 to 24.9 is normal.  A BMI of 25 to 29.9 is considered overweight.  A  BMI of 30 and above is considered obese.  Maintain normal blood lipids and cholesterol levels by exercising and minimizing your intake of saturated fat. Eat a balanced diet with plenty of fruit and vegetables. Blood tests for lipids and cholesterol should begin at age 20 and be repeated every 5 years. If your lipid or cholesterol levels are high, you are over 50, or you are at high risk for heart disease, you may need your cholesterol levels checked more frequently.Ongoing high lipid and cholesterol levels should be treated with medicines if diet and exercise are not working.  If you smoke, find out from your health care provider how to quit. If you do not use tobacco, do not start.  Lung cancer screening is recommended for adults aged 55-80 years who are at high risk for developing lung cancer because of a history of smoking. A yearly low-dose CT scan of the lungs is recommended for people who have at least a 30-pack-year history of smoking and are a current smoker or have quit within the past 15 years. A pack year of smoking is smoking an average of 1 pack of cigarettes a day for 1 year (for example: 1 pack a day for 30 years or 2 packs a day for 15 years). Yearly screening should continue until the smoker has stopped smoking for at least 15 years. Yearly screening should be stopped for people who develop a health problem that would prevent them from having lung cancer treatment.  If you choose to drink alcohol, do not have more   than 2 drinks per day. One drink is considered to be 12 ounces (355 mL) of beer, 5 ounces (148 mL) of wine, or 1.5 ounces (44 mL) of liquor.  High blood pressure causes heart disease and increases the risk of stroke. Your blood pressure should be checked at least every 1-2 years. Ongoing high blood pressure should be treated with medicines, if weight loss and exercise are not effective.  Colorectal cancer can be detected and often prevented. Most routine colorectal cancer  screening begins at the age of 75 and continues through age 10. On a yearly basis, your health care provider may provide home test kits to check for hidden blood in the stool. Use of a small camera at the end of a tube to directly examine the colon (sigmoidoscopy or colonoscopy) can detect the earliest forms of colorectal cancer.  Direct exam of the colon should be repeated every 5-10 years through age 38, unless early forms of precancerous polyps or small growths are found.   Healthy men should  receive prostate-specific antigen (PSA) blood tests as part of routine cancer screening. Talk with your health care provider about prostate cancer screening.  Once a month, do a whole-body skin exam, using a mirror to look at the skin on your back. Tell your health care provider about new moles, moles that have irregular borders, moles that are larger than a pencil eraser, or moles that have changed in shape or color.  Stay current with required vaccines (immunizations).  Influenza vaccine. All adults should be immunized every year.  Tetanus, diphtheria, and acellular pertussis (Td, Tdap) vaccine. An adult who has not previously received Tdap or who does not know his vaccine status should receive 1 dose of Tdap. This initial dose should be followed by tetanus and diphtheria toxoids (Td) booster doses every 10 years. Adults with an unknown or incomplete history of completing a 3-dose immunization series with Td-containing vaccines should begin or complete a primary immunization series including a Tdap dose. Adults should receive a Td booster every 10 years.  Zoster vaccine. One dose is recommended for adults aged 23 years or older unless certain conditions are present.    PREVNAR - Pneumococcal 13-valent conjugate (PCV13) vaccine. When indicated, a person who is uncertain of his immunization history and has no record of immunization should receive the PCV13 vaccine. An adult aged 62 years or older who has  certain medical conditions and has not been previously immunized should receive 1 dose of PCV13 vaccine. This PCV13 should be followed with a dose of pneumococcal polysaccharide (PPSV23) vaccine. The PPSV23 vaccine dose should be obtained at least 8 weeks after the dose of PCV13 vaccine. An adult aged 60 years or older who has certain medical conditions and previously received 1 or more doses of PPSV23 vaccine should receive 1 dose of PCV13. The PCV13 vaccine dose should be obtained 1 or more years after the last PPSV23 vaccine dose.    PNEUMOVAX - Pneumococcal polysaccharide (PPSV23) vaccine. When PCV13 is also indicated, PCV13 should be obtained first. All adults aged 45 years and older should be immunized. An adult younger than age 63 years who has certain medical conditions should be immunized. Any person who resides in a nursing home or long-term care facility should be immunized. An adult smoker should be immunized. People with an immunocompromised condition and certain other conditions should receive both PCV13 and PPSV23 vaccines. People with human immunodeficiency virus (HIV) infection should be immunized as soon as possible after diagnosis.  Immunization during chemotherapy or radiation therapy should be avoided. Routine use of PPSV23 vaccine is not recommended for American Indians, [place] Natives, or people younger than 65 years unless there are medical conditions that require PPSV23 vaccine. When indicated, people who have unknown immunization and have no record of immunization should receive PPSV23 vaccine. One-time revaccination 5 years after the first dose of PPSV23 is recommended for people aged 19-64 years who have chronic kidney failure, nephrotic syndrome, asplenia, or immunocompromised conditions. People who received 1-2 doses of PPSV23 before age 24 years should receive another dose of PPSV23 vaccine at age 43 years or later if at least 5 years have passed since the previous dose. Doses of  PPSV23 are not needed for people immunized with PPSV23 at or after age 10 years.    Hepatitis A vaccine. Adults who wish to be protected from this disease, have certain high-risk conditions, work with hepatitis A-infected animals, work in hepatitis A research labs, or travel to or work in countries with a high rate of hepatitis A should be immunized. Adults who were previously unvaccinated and who anticipate close contact with an international adoptee during the first 60 days after arrival in the [place] from a country with a high rate of hepatitis A should be immunized.    Hepatitis B vaccine. Adults should be immunized if they wish to be protected from this disease, have certain high-risk conditions, may be exposed to blood or other infectious body fluids, are household contacts or sex partners of hepatitis B positive people, are clients or workers in certain care facilities, or travel to or work in countries with a high rate of hepatitis B.   Preventive Service / Frequency   Ages 19 and over  Blood pressure check.  Lipid and cholesterol check.  Lung cancer screening. / Every year if you are aged 51-80 years and have a 30-pack-year history of smoking and currently smoke or have quit within the past 15 years. Yearly screening is stopped once you have quit smoking for at least 15 years or develop a health problem that would prevent you from having lung cancer treatment.  Fecal occult blood test (FOBT) of stool. You may not have to do this test if you get a colonoscopy every 10 years.  Flexible sigmoidoscopy** or colonoscopy.** / Every 5 years for a flexible sigmoidoscopy or every 10 years for a colonoscopy beginning at age 13 and continuing until age 32.  Skin self-exam. / Monthly.  Influenza vaccine. / Every year.  Tetanus, diphtheria, and acellular pertussis (Tdap/Td) vaccine.** / 1 dose of Td every 10 years.   Zoster vaccine.** / 1 dose for adults aged 68 years or older.          Pneumococcal 13-valent conjugate (PCV13) vaccine.    Pneumococcal polysaccharide (PPSV23) vaccine.    ++++++++++++++++++++++++++++++++ Recommend Adult Low Dose Aspirin or  coated  Aspirin 81 mg daily  To reduce risk of Colon Cancer 20 %,  Skin Cancer 26 % ,  Melanoma 46%  and  Pancreatic cancer 60% ++++++++++++++++++++++ Vitamin D goal  is between 70-100.  Please make sure that you are taking your Vitamin D as directed.  It is very important as a natural anti-inflammatory  helping hair, skin, and nails, as well as reducing stroke and heart attack risk.  It helps your bones and helps with mood. It also decreases numerous cancer risks so please take it as directed.  Low Vit D is associated with a 200-300% higher risk for  CANCER  and 200-300% higher risk for HEART   ATTACK  &  STROKE.   .....................................Marland Kitchen It is also associated with higher death rate at younger ages,  autoimmune diseases like Rheumatoid arthritis, Lupus, Multiple Sclerosis.    Also many other serious conditions, like depression, Alzheimer's Dementia, infertility, muscle aches, fatigue, fibromyalgia - just to name a few. ++++++++++++++++++++++ Recommend the book "The END of DIETING" by Dr Excell Seltzer  & the book "The END of DIABETES " by Dr Excell Seltzer At [email or web address] - get book & Audio CD's    Being diabetic has a  300% increased risk for heart attack, stroke, cancer, and alzheimer- type vascular dementia. It is very important that you work harder with diet by avoiding all foods that are white. Avoid white rice (brown & wild rice is OK), white potatoes (sweetpotatoes in moderation is OK), White bread or wheat bread or anything made out of white flour like bagels, donuts, rolls, buns, biscuits, cakes, pastries, cookies, pizza crust, and pasta (made from white flour & egg whites) - vegetarian pasta or spinach or wheat pasta is OK. Multigrain breads like Arnold's or [employer], or multigrain  sandwich thins or flatbreads.  Diet, exercise and weight loss can reverse and cure diabetes in the early stages.  Diet, exercise and weight loss is very important in the control and prevention of complications of diabetes which affects every system in your body, ie. Brain - dementia/stroke, eyes - glaucoma/blindness, heart - heart attack/heart failure, kidneys - dialysis, stomach - gastric paralysis, intestines - malabsorption, nerves - severe painful neuritis, circulation - gangrene & loss of a leg(s), and finally cancer and Alzheimers.    I recommend avoid fried & greasy foods,  sweets/candy, white rice (brown or wild rice or Quinoa is OK), white potatoes (sweet potatoes are OK) - anything made from white flour - bagels, doughnuts, rolls, buns, biscuits,white and wheat breads, pizza crust and traditional pasta made of white flour & egg white(vegetarian pasta or spinach or wheat pasta is OK).  Multi-grain bread is OK - like multi-grain flat bread or sandwich thins. Avoid alcohol in excess. Exercise is also important.    Eat all the vegetables you want - avoid meat, especially red meat and dairy - especially cheese.  Cheese is the most concentrated form of trans-fats which is the worst thing to clog up our arteries. Veggie cheese is OK which can be found in the fresh produce section at [employer] or [employer] or [employer]  ++++++++++++++++++++++ DASH Eating Plan  DASH stands for "Dietary Approaches to Stop Hypertension."   The DASH eating plan is a healthy eating plan that has been shown to reduce high blood pressure (hypertension). Additional health benefits may include reducing the risk of type 2 diabetes mellitus, heart disease, and stroke. The DASH eating plan may also help with weight loss. WHAT DO I NEED TO KNOW ABOUT THE DASH EATING PLAN? For the DASH eating plan, you will follow these general guidelines:  Choose foods with a percent daily value for sodium of less than 5% (as listed on the  food label).  Use salt-free seasonings or herbs instead of table salt or sea salt.  Check with your health care provider or pharmacist before using salt substitutes.  Eat lower-sodium products, often labeled as "lower sodium" or "no salt added."  Eat fresh foods.  Eat more vegetables, fruits, and low-fat dairy products.  Choose whole grains. Look for the word "whole" as the first word in the ingredient  list.  Choose fish   Limit sweets, desserts, sugars, and sugary drinks.  Choose heart-healthy fats.  Eat veggie cheese   Eat more home-cooked food and less restaurant, buffet, and fast food.  Limit fried foods.  Cook foods using methods other than frying.  Limit canned vegetables. If you do use them, rinse them well to decrease the sodium.  When eating at a restaurant, ask that your food be prepared with less salt, or no salt if possible.                      WHAT FOODS CAN I EAT? Read Dr Fara Olden Fuhrman's books on The End of Dieting & The End of Diabetes  Grains Whole grain or whole wheat bread. Brown rice. Whole grain or whole wheat pasta. Quinoa, bulgur, and whole grain cereals. Low-sodium cereals. Corn or whole wheat flour tortillas. Whole grain cornbread. Whole grain crackers. Low-sodium crackers.  Vegetables Fresh or frozen vegetables (raw, steamed, roasted, or grilled). Low-sodium or reduced-sodium tomato and vegetable juices. Low-sodium or reduced-sodium tomato sauce and paste. Low-sodium or reduced-sodium canned vegetables.   Fruits All fresh, canned (in natural juice), or frozen fruits.  Protein Products  All fish and seafood.  Dried beans, peas, or lentils. Unsalted nuts and seeds. Unsalted canned beans.  Dairy Low-fat dairy products, such as skim or 1% milk, 2% or reduced-fat cheeses, low-fat ricotta or cottage cheese, or plain low-fat yogurt. Low-sodium or reduced-sodium cheeses.  Fats and Oils Tub margarines without trans fats. Light or reduced-fat  mayonnaise and salad dressings (reduced sodium). Avocado. Safflower, olive, or canola oils. Natural peanut or almond butter.  Other Unsalted popcorn and pretzels. The items listed above may not be a complete list of recommended foods or beverages. Contact your dietitian for more options.  ++++++++++++++++++++  WHAT FOODS ARE NOT RECOMMENDED? Grains/ White flour or wheat flour White bread. White pasta. White rice. Refined cornbread. Bagels and croissants. Crackers that contain trans fat.  Vegetables  Creamed or fried vegetables. Vegetables in a . Regular canned vegetables. Regular canned tomato sauce and paste. Regular tomato and vegetable juices.  Fruits Dried fruits. Canned fruit in light or heavy syrup. Fruit juice.  Meat and Other Protein Products Meat in general - RED meat & White meat.  Fatty cuts of meat. Ribs, chicken wings, all processed meats as bacon, sausage, bologna, salami, fatback, hot dogs, bratwurst and packaged luncheon meats.  Dairy Whole or 2% milk, cream, half-and-half, and cream cheese. Whole-fat or sweetened yogurt. Full-fat cheeses or blue cheese. Non-dairy creamers and whipped toppings. Processed cheese, cheese spreads, or cheese curds.  Condiments Onion and garlic salt, seasoned salt, table salt, and sea salt. Canned and packaged gravies. Worcestershire sauce. Tartar sauce. Barbecue sauce. Teriyaki sauce. Soy sauce, including reduced sodium. Steak sauce. Fish sauce. Oyster sauce. Cocktail sauce. Horseradish. Ketchup and mustard. Meat flavorings and tenderizers. Bouillon cubes. Hot sauce. Tabasco sauce. Marinades. Taco seasonings. Relishes.  Fats and Oils Butter, stick margarine, lard, shortening and bacon fat. Coconut, palm kernel, or palm oils. Regular salad dressings.  Pickles and olives. Salted popcorn and pretzels.  The items listed above may not be a complete list of foods and beverages to avoid.

## 2017-05-30 ENCOUNTER — Encounter: Payer: Self-pay | Admitting: Internal Medicine

## 2017-05-30 ENCOUNTER — Ambulatory Visit (INDEPENDENT_AMBULATORY_CARE_PROVIDER_SITE_OTHER): Payer: Medicare Other | Admitting: Internal Medicine

## 2017-05-30 VITALS — BP 126/84 | HR 76 | Temp 97.1°F | Resp 24 | Ht 66.5 in | Wt 154.8 lb

## 2017-05-30 DIAGNOSIS — Z8546 Personal history of malignant neoplasm of prostate: Secondary | ICD-10-CM | POA: Diagnosis not present

## 2017-05-30 DIAGNOSIS — I10 Essential (primary) hypertension: Secondary | ICD-10-CM

## 2017-05-30 DIAGNOSIS — R7303 Prediabetes: Secondary | ICD-10-CM | POA: Diagnosis not present

## 2017-05-30 DIAGNOSIS — Z8551 Personal history of malignant neoplasm of bladder: Secondary | ICD-10-CM

## 2017-05-30 DIAGNOSIS — I130 Hypertensive heart and chronic kidney disease with heart failure and stage 1 through stage 4 chronic kidney disease, or unspecified chronic kidney disease: Secondary | ICD-10-CM | POA: Diagnosis not present

## 2017-05-30 DIAGNOSIS — I5042 Chronic combined systolic (congestive) and diastolic (congestive) heart failure: Secondary | ICD-10-CM | POA: Diagnosis not present

## 2017-05-30 DIAGNOSIS — G301 Alzheimer's disease with late onset: Secondary | ICD-10-CM

## 2017-05-30 DIAGNOSIS — E782 Mixed hyperlipidemia: Secondary | ICD-10-CM | POA: Diagnosis not present

## 2017-05-30 DIAGNOSIS — F0280 Dementia in other diseases classified elsewhere without behavioral disturbance: Secondary | ICD-10-CM

## 2017-05-30 DIAGNOSIS — R7309 Other abnormal glucose: Secondary | ICD-10-CM

## 2017-05-30 DIAGNOSIS — E559 Vitamin D deficiency, unspecified: Secondary | ICD-10-CM

## 2017-05-30 DIAGNOSIS — N183 Chronic kidney disease, stage 3 unspecified: Secondary | ICD-10-CM

## 2017-05-30 DIAGNOSIS — E039 Hypothyroidism, unspecified: Secondary | ICD-10-CM

## 2017-05-30 DIAGNOSIS — Z136 Encounter for screening for cardiovascular disorders: Secondary | ICD-10-CM

## 2017-05-30 DIAGNOSIS — I313 Pericardial effusion (noninflammatory): Secondary | ICD-10-CM | POA: Diagnosis not present

## 2017-05-30 DIAGNOSIS — Z79899 Other long term (current) drug therapy: Secondary | ICD-10-CM

## 2017-05-30 DIAGNOSIS — Z1211 Encounter for screening for malignant neoplasm of colon: Secondary | ICD-10-CM

## 2017-05-30 DIAGNOSIS — D631 Anemia in chronic kidney disease: Secondary | ICD-10-CM | POA: Diagnosis not present

## 2017-05-30 DIAGNOSIS — Z1212 Encounter for screening for malignant neoplasm of rectum: Secondary | ICD-10-CM

## 2017-05-31 DIAGNOSIS — C61 Malignant neoplasm of prostate: Secondary | ICD-10-CM | POA: Diagnosis not present

## 2017-05-31 DIAGNOSIS — C670 Malignant neoplasm of trigone of bladder: Secondary | ICD-10-CM | POA: Diagnosis not present

## 2017-05-31 LAB — HEMOGLOBIN A1C
EAG (MMOL/L): 7.1 (calc)
Hgb A1c MFr Bld: 6.1 % of total Hgb — ABNORMAL HIGH (ref <5.7)
Mean Plasma Glucose: 128 (calc)

## 2017-05-31 LAB — PSA: PSA: 10.5 ng/mL — AB (ref <=4.0)

## 2017-05-31 LAB — BASIC METABOLIC PANEL WITH GFR
BUN/Creatinine Ratio: 20 (calc) (ref 6–22)
BUN: 28 mg/dL — ABNORMAL HIGH (ref 7–25)
CALCIUM: 9.8 mg/dL (ref 8.6–10.3)
CO2: 26 mmol/L (ref 20–32)
CREATININE: 1.38 mg/dL — AB (ref 0.70–1.11)
Chloride: 105 mmol/L (ref 98–110)
GFR, Est African American: 53 mL/min/{1.73_m2} — ABNORMAL LOW (ref >=60)
GFR, Est Non African American: 46 mL/min/{1.73_m2} — ABNORMAL LOW (ref >=60)
Glucose, Bld: 124 mg/dL — ABNORMAL HIGH (ref 65–99)
POTASSIUM: 3.6 mmol/L (ref 3.5–5.3)
Sodium: 141 mmol/L (ref 135–146)

## 2017-05-31 LAB — CBC WITH DIFFERENTIAL/PLATELET
BASOS ABS: 37 {cells}/uL (ref 0–200)
Basophils Relative: 0.6 %
EOS ABS: 180 {cells}/uL (ref 15–500)
Eosinophils Relative: 2.9 %
HEMATOCRIT: 40.7 % (ref 38.5–50.0)
HEMOGLOBIN: 12.9 g/dL — AB (ref 13.2–17.1)
LYMPHS ABS: 893 {cells}/uL (ref 850–3900)
MCH: 27 pg (ref 27.0–33.0)
MCHC: 31.7 g/dL — AB (ref 32.0–36.0)
MCV: 85.1 fL (ref 80.0–100.0)
MONOS PCT: 7.5 %
MPV: 12.6 fL — ABNORMAL HIGH (ref 7.5–12.5)
NEUTROS ABS: 4625 {cells}/uL (ref 1500–7800)
Neutrophils Relative %: 74.6 %
Platelets: 160 10*3/uL (ref 140–400)
RBC: 4.78 10*6/uL (ref 4.20–5.80)
RDW: 15.3 % — AB (ref 11.0–15.0)
Total Lymphocyte: 14.4 %
WBC mixed population: 465 cells/uL (ref 200–950)
WBC: 6.2 10*3/uL (ref 3.8–10.8)

## 2017-05-31 LAB — HEPATIC FUNCTION PANEL
AG Ratio: 1.5 (calc) (ref 1.0–2.5)
ALBUMIN MSPROF: 4.1 g/dL (ref 3.6–5.1)
ALT: 25 U/L (ref 9–46)
AST: 20 U/L (ref 10–35)
Alkaline phosphatase (APISO): 96 U/L (ref 40–115)
BILIRUBIN TOTAL: 1.3 mg/dL — AB (ref 0.2–1.2)
Bilirubin, Direct: 0.4 mg/dL — ABNORMAL HIGH (ref 0.0–0.2)
Globulin: 2.7 g/dL (calc) (ref 1.9–3.7)
Indirect Bilirubin: 0.9 mg/dL (calc) (ref 0.2–1.2)
Total Protein: 6.8 g/dL (ref 6.1–8.1)

## 2017-05-31 LAB — TSH: TSH: 3 mIU/L (ref 0.40–4.50)

## 2017-06-04 DIAGNOSIS — I5042 Chronic combined systolic (congestive) and diastolic (congestive) heart failure: Secondary | ICD-10-CM | POA: Diagnosis not present

## 2017-06-04 DIAGNOSIS — I313 Pericardial effusion (noninflammatory): Secondary | ICD-10-CM | POA: Diagnosis not present

## 2017-06-04 DIAGNOSIS — N183 Chronic kidney disease, stage 3 (moderate): Secondary | ICD-10-CM | POA: Diagnosis not present

## 2017-06-04 DIAGNOSIS — F0280 Dementia in other diseases classified elsewhere without behavioral disturbance: Secondary | ICD-10-CM | POA: Diagnosis not present

## 2017-06-04 DIAGNOSIS — Z8546 Personal history of malignant neoplasm of prostate: Secondary | ICD-10-CM | POA: Diagnosis not present

## 2017-06-04 DIAGNOSIS — Z8551 Personal history of malignant neoplasm of bladder: Secondary | ICD-10-CM | POA: Diagnosis not present

## 2017-06-04 DIAGNOSIS — G301 Alzheimer's disease with late onset: Secondary | ICD-10-CM | POA: Diagnosis not present

## 2017-06-04 DIAGNOSIS — I130 Hypertensive heart and chronic kidney disease with heart failure and stage 1 through stage 4 chronic kidney disease, or unspecified chronic kidney disease: Secondary | ICD-10-CM | POA: Diagnosis not present

## 2017-06-04 DIAGNOSIS — D631 Anemia in chronic kidney disease: Secondary | ICD-10-CM | POA: Diagnosis not present

## 2017-06-06 DIAGNOSIS — G301 Alzheimer's disease with late onset: Secondary | ICD-10-CM | POA: Diagnosis not present

## 2017-06-06 DIAGNOSIS — I130 Hypertensive heart and chronic kidney disease with heart failure and stage 1 through stage 4 chronic kidney disease, or unspecified chronic kidney disease: Secondary | ICD-10-CM | POA: Diagnosis not present

## 2017-06-06 DIAGNOSIS — N183 Chronic kidney disease, stage 3 (moderate): Secondary | ICD-10-CM | POA: Diagnosis not present

## 2017-06-06 DIAGNOSIS — I313 Pericardial effusion (noninflammatory): Secondary | ICD-10-CM | POA: Diagnosis not present

## 2017-06-06 DIAGNOSIS — D631 Anemia in chronic kidney disease: Secondary | ICD-10-CM | POA: Diagnosis not present

## 2017-06-06 DIAGNOSIS — I5042 Chronic combined systolic (congestive) and diastolic (congestive) heart failure: Secondary | ICD-10-CM | POA: Diagnosis not present

## 2017-06-11 DIAGNOSIS — I5042 Chronic combined systolic (congestive) and diastolic (congestive) heart failure: Secondary | ICD-10-CM | POA: Diagnosis not present

## 2017-06-11 DIAGNOSIS — N183 Chronic kidney disease, stage 3 (moderate): Secondary | ICD-10-CM | POA: Diagnosis not present

## 2017-06-11 DIAGNOSIS — D631 Anemia in chronic kidney disease: Secondary | ICD-10-CM | POA: Diagnosis not present

## 2017-06-11 DIAGNOSIS — I313 Pericardial effusion (noninflammatory): Secondary | ICD-10-CM | POA: Diagnosis not present

## 2017-06-11 DIAGNOSIS — I130 Hypertensive heart and chronic kidney disease with heart failure and stage 1 through stage 4 chronic kidney disease, or unspecified chronic kidney disease: Secondary | ICD-10-CM | POA: Diagnosis not present

## 2017-06-11 DIAGNOSIS — G301 Alzheimer's disease with late onset: Secondary | ICD-10-CM | POA: Diagnosis not present

## 2017-06-13 DIAGNOSIS — D631 Anemia in chronic kidney disease: Secondary | ICD-10-CM | POA: Diagnosis not present

## 2017-06-13 DIAGNOSIS — I313 Pericardial effusion (noninflammatory): Secondary | ICD-10-CM | POA: Diagnosis not present

## 2017-06-13 DIAGNOSIS — I130 Hypertensive heart and chronic kidney disease with heart failure and stage 1 through stage 4 chronic kidney disease, or unspecified chronic kidney disease: Secondary | ICD-10-CM | POA: Diagnosis not present

## 2017-06-13 DIAGNOSIS — N183 Chronic kidney disease, stage 3 (moderate): Secondary | ICD-10-CM | POA: Diagnosis not present

## 2017-06-13 DIAGNOSIS — G301 Alzheimer's disease with late onset: Secondary | ICD-10-CM | POA: Diagnosis not present

## 2017-06-13 DIAGNOSIS — I5042 Chronic combined systolic (congestive) and diastolic (congestive) heart failure: Secondary | ICD-10-CM | POA: Diagnosis not present

## 2017-06-21 DIAGNOSIS — I130 Hypertensive heart and chronic kidney disease with heart failure and stage 1 through stage 4 chronic kidney disease, or unspecified chronic kidney disease: Secondary | ICD-10-CM | POA: Diagnosis not present

## 2017-06-21 DIAGNOSIS — G301 Alzheimer's disease with late onset: Secondary | ICD-10-CM | POA: Diagnosis not present

## 2017-06-21 DIAGNOSIS — N183 Chronic kidney disease, stage 3 (moderate): Secondary | ICD-10-CM | POA: Diagnosis not present

## 2017-06-21 DIAGNOSIS — I313 Pericardial effusion (noninflammatory): Secondary | ICD-10-CM | POA: Diagnosis not present

## 2017-06-21 DIAGNOSIS — I5042 Chronic combined systolic (congestive) and diastolic (congestive) heart failure: Secondary | ICD-10-CM | POA: Diagnosis not present

## 2017-06-21 DIAGNOSIS — D631 Anemia in chronic kidney disease: Secondary | ICD-10-CM | POA: Diagnosis not present

## 2017-06-26 DIAGNOSIS — N183 Chronic kidney disease, stage 3 (moderate): Secondary | ICD-10-CM | POA: Diagnosis not present

## 2017-06-26 DIAGNOSIS — I5042 Chronic combined systolic (congestive) and diastolic (congestive) heart failure: Secondary | ICD-10-CM | POA: Diagnosis not present

## 2017-06-26 DIAGNOSIS — G301 Alzheimer's disease with late onset: Secondary | ICD-10-CM | POA: Diagnosis not present

## 2017-06-26 DIAGNOSIS — I130 Hypertensive heart and chronic kidney disease with heart failure and stage 1 through stage 4 chronic kidney disease, or unspecified chronic kidney disease: Secondary | ICD-10-CM | POA: Diagnosis not present

## 2017-06-26 DIAGNOSIS — D631 Anemia in chronic kidney disease: Secondary | ICD-10-CM | POA: Diagnosis not present

## 2017-06-26 DIAGNOSIS — I313 Pericardial effusion (noninflammatory): Secondary | ICD-10-CM | POA: Diagnosis not present

## 2017-06-27 DIAGNOSIS — I130 Hypertensive heart and chronic kidney disease with heart failure and stage 1 through stage 4 chronic kidney disease, or unspecified chronic kidney disease: Secondary | ICD-10-CM | POA: Diagnosis not present

## 2017-06-27 DIAGNOSIS — I313 Pericardial effusion (noninflammatory): Secondary | ICD-10-CM | POA: Diagnosis not present

## 2017-06-27 DIAGNOSIS — I5042 Chronic combined systolic (congestive) and diastolic (congestive) heart failure: Secondary | ICD-10-CM | POA: Diagnosis not present

## 2017-06-27 DIAGNOSIS — G301 Alzheimer's disease with late onset: Secondary | ICD-10-CM | POA: Diagnosis not present

## 2017-06-27 DIAGNOSIS — N183 Chronic kidney disease, stage 3 (moderate): Secondary | ICD-10-CM | POA: Diagnosis not present

## 2017-06-27 DIAGNOSIS — D631 Anemia in chronic kidney disease: Secondary | ICD-10-CM | POA: Diagnosis not present

## 2017-06-29 DIAGNOSIS — I313 Pericardial effusion (noninflammatory): Secondary | ICD-10-CM | POA: Diagnosis not present

## 2017-06-29 DIAGNOSIS — D631 Anemia in chronic kidney disease: Secondary | ICD-10-CM | POA: Diagnosis not present

## 2017-06-29 DIAGNOSIS — N183 Chronic kidney disease, stage 3 (moderate): Secondary | ICD-10-CM | POA: Diagnosis not present

## 2017-06-29 DIAGNOSIS — I5042 Chronic combined systolic (congestive) and diastolic (congestive) heart failure: Secondary | ICD-10-CM | POA: Diagnosis not present

## 2017-06-29 DIAGNOSIS — I130 Hypertensive heart and chronic kidney disease with heart failure and stage 1 through stage 4 chronic kidney disease, or unspecified chronic kidney disease: Secondary | ICD-10-CM | POA: Diagnosis not present

## 2017-06-29 DIAGNOSIS — G301 Alzheimer's disease with late onset: Secondary | ICD-10-CM | POA: Diagnosis not present

## 2017-07-02 DIAGNOSIS — D631 Anemia in chronic kidney disease: Secondary | ICD-10-CM | POA: Diagnosis not present

## 2017-07-02 DIAGNOSIS — I130 Hypertensive heart and chronic kidney disease with heart failure and stage 1 through stage 4 chronic kidney disease, or unspecified chronic kidney disease: Secondary | ICD-10-CM | POA: Diagnosis not present

## 2017-07-02 DIAGNOSIS — N183 Chronic kidney disease, stage 3 (moderate): Secondary | ICD-10-CM | POA: Diagnosis not present

## 2017-07-02 DIAGNOSIS — G301 Alzheimer's disease with late onset: Secondary | ICD-10-CM | POA: Diagnosis not present

## 2017-07-02 DIAGNOSIS — I313 Pericardial effusion (noninflammatory): Secondary | ICD-10-CM | POA: Diagnosis not present

## 2017-07-02 DIAGNOSIS — I5042 Chronic combined systolic (congestive) and diastolic (congestive) heart failure: Secondary | ICD-10-CM | POA: Diagnosis not present

## 2017-07-04 DIAGNOSIS — I313 Pericardial effusion (noninflammatory): Secondary | ICD-10-CM | POA: Diagnosis not present

## 2017-07-04 DIAGNOSIS — I5042 Chronic combined systolic (congestive) and diastolic (congestive) heart failure: Secondary | ICD-10-CM | POA: Diagnosis not present

## 2017-07-04 DIAGNOSIS — Z8551 Personal history of malignant neoplasm of bladder: Secondary | ICD-10-CM | POA: Diagnosis not present

## 2017-07-04 DIAGNOSIS — F0280 Dementia in other diseases classified elsewhere without behavioral disturbance: Secondary | ICD-10-CM | POA: Diagnosis not present

## 2017-07-04 DIAGNOSIS — Z8546 Personal history of malignant neoplasm of prostate: Secondary | ICD-10-CM | POA: Diagnosis not present

## 2017-07-04 DIAGNOSIS — G301 Alzheimer's disease with late onset: Secondary | ICD-10-CM | POA: Diagnosis not present

## 2017-07-04 DIAGNOSIS — I2510 Atherosclerotic heart disease of native coronary artery without angina pectoris: Secondary | ICD-10-CM | POA: Diagnosis not present

## 2017-07-04 DIAGNOSIS — I130 Hypertensive heart and chronic kidney disease with heart failure and stage 1 through stage 4 chronic kidney disease, or unspecified chronic kidney disease: Secondary | ICD-10-CM | POA: Diagnosis not present

## 2017-07-04 DIAGNOSIS — D631 Anemia in chronic kidney disease: Secondary | ICD-10-CM | POA: Diagnosis not present

## 2017-07-04 DIAGNOSIS — N183 Chronic kidney disease, stage 3 (moderate): Secondary | ICD-10-CM | POA: Diagnosis not present

## 2017-07-06 DIAGNOSIS — I313 Pericardial effusion (noninflammatory): Secondary | ICD-10-CM | POA: Diagnosis not present

## 2017-07-06 DIAGNOSIS — I130 Hypertensive heart and chronic kidney disease with heart failure and stage 1 through stage 4 chronic kidney disease, or unspecified chronic kidney disease: Secondary | ICD-10-CM | POA: Diagnosis not present

## 2017-07-06 DIAGNOSIS — D631 Anemia in chronic kidney disease: Secondary | ICD-10-CM | POA: Diagnosis not present

## 2017-07-06 DIAGNOSIS — I2510 Atherosclerotic heart disease of native coronary artery without angina pectoris: Secondary | ICD-10-CM | POA: Diagnosis not present

## 2017-07-06 DIAGNOSIS — N183 Chronic kidney disease, stage 3 (moderate): Secondary | ICD-10-CM | POA: Diagnosis not present

## 2017-07-06 DIAGNOSIS — I5042 Chronic combined systolic (congestive) and diastolic (congestive) heart failure: Secondary | ICD-10-CM | POA: Diagnosis not present

## 2017-07-10 DIAGNOSIS — I130 Hypertensive heart and chronic kidney disease with heart failure and stage 1 through stage 4 chronic kidney disease, or unspecified chronic kidney disease: Secondary | ICD-10-CM | POA: Diagnosis not present

## 2017-07-10 DIAGNOSIS — I313 Pericardial effusion (noninflammatory): Secondary | ICD-10-CM | POA: Diagnosis not present

## 2017-07-10 DIAGNOSIS — I2510 Atherosclerotic heart disease of native coronary artery without angina pectoris: Secondary | ICD-10-CM | POA: Diagnosis not present

## 2017-07-10 DIAGNOSIS — N183 Chronic kidney disease, stage 3 (moderate): Secondary | ICD-10-CM | POA: Diagnosis not present

## 2017-07-10 DIAGNOSIS — D631 Anemia in chronic kidney disease: Secondary | ICD-10-CM | POA: Diagnosis not present

## 2017-07-10 DIAGNOSIS — I5042 Chronic combined systolic (congestive) and diastolic (congestive) heart failure: Secondary | ICD-10-CM | POA: Diagnosis not present

## 2017-07-13 DIAGNOSIS — I5042 Chronic combined systolic (congestive) and diastolic (congestive) heart failure: Secondary | ICD-10-CM | POA: Diagnosis not present

## 2017-07-13 DIAGNOSIS — N183 Chronic kidney disease, stage 3 (moderate): Secondary | ICD-10-CM | POA: Diagnosis not present

## 2017-07-13 DIAGNOSIS — I2510 Atherosclerotic heart disease of native coronary artery without angina pectoris: Secondary | ICD-10-CM | POA: Diagnosis not present

## 2017-07-13 DIAGNOSIS — D631 Anemia in chronic kidney disease: Secondary | ICD-10-CM | POA: Diagnosis not present

## 2017-07-13 DIAGNOSIS — I130 Hypertensive heart and chronic kidney disease with heart failure and stage 1 through stage 4 chronic kidney disease, or unspecified chronic kidney disease: Secondary | ICD-10-CM | POA: Diagnosis not present

## 2017-07-13 DIAGNOSIS — I313 Pericardial effusion (noninflammatory): Secondary | ICD-10-CM | POA: Diagnosis not present

## 2017-07-17 DIAGNOSIS — I313 Pericardial effusion (noninflammatory): Secondary | ICD-10-CM | POA: Diagnosis not present

## 2017-07-17 DIAGNOSIS — I5042 Chronic combined systolic (congestive) and diastolic (congestive) heart failure: Secondary | ICD-10-CM | POA: Diagnosis not present

## 2017-07-17 DIAGNOSIS — I130 Hypertensive heart and chronic kidney disease with heart failure and stage 1 through stage 4 chronic kidney disease, or unspecified chronic kidney disease: Secondary | ICD-10-CM | POA: Diagnosis not present

## 2017-07-17 DIAGNOSIS — D631 Anemia in chronic kidney disease: Secondary | ICD-10-CM | POA: Diagnosis not present

## 2017-07-17 DIAGNOSIS — N183 Chronic kidney disease, stage 3 (moderate): Secondary | ICD-10-CM | POA: Diagnosis not present

## 2017-07-17 DIAGNOSIS — I2510 Atherosclerotic heart disease of native coronary artery without angina pectoris: Secondary | ICD-10-CM | POA: Diagnosis not present

## 2017-07-20 DIAGNOSIS — D631 Anemia in chronic kidney disease: Secondary | ICD-10-CM | POA: Diagnosis not present

## 2017-07-20 DIAGNOSIS — N183 Chronic kidney disease, stage 3 (moderate): Secondary | ICD-10-CM | POA: Diagnosis not present

## 2017-07-20 DIAGNOSIS — I130 Hypertensive heart and chronic kidney disease with heart failure and stage 1 through stage 4 chronic kidney disease, or unspecified chronic kidney disease: Secondary | ICD-10-CM | POA: Diagnosis not present

## 2017-07-20 DIAGNOSIS — I5042 Chronic combined systolic (congestive) and diastolic (congestive) heart failure: Secondary | ICD-10-CM | POA: Diagnosis not present

## 2017-07-20 DIAGNOSIS — I2510 Atherosclerotic heart disease of native coronary artery without angina pectoris: Secondary | ICD-10-CM | POA: Diagnosis not present

## 2017-07-20 DIAGNOSIS — I313 Pericardial effusion (noninflammatory): Secondary | ICD-10-CM | POA: Diagnosis not present

## 2017-07-24 DIAGNOSIS — I313 Pericardial effusion (noninflammatory): Secondary | ICD-10-CM | POA: Diagnosis not present

## 2017-07-24 DIAGNOSIS — I5042 Chronic combined systolic (congestive) and diastolic (congestive) heart failure: Secondary | ICD-10-CM | POA: Diagnosis not present

## 2017-07-24 DIAGNOSIS — D631 Anemia in chronic kidney disease: Secondary | ICD-10-CM | POA: Diagnosis not present

## 2017-07-24 DIAGNOSIS — I2510 Atherosclerotic heart disease of native coronary artery without angina pectoris: Secondary | ICD-10-CM | POA: Diagnosis not present

## 2017-07-24 DIAGNOSIS — N183 Chronic kidney disease, stage 3 (moderate): Secondary | ICD-10-CM | POA: Diagnosis not present

## 2017-07-24 DIAGNOSIS — I130 Hypertensive heart and chronic kidney disease with heart failure and stage 1 through stage 4 chronic kidney disease, or unspecified chronic kidney disease: Secondary | ICD-10-CM | POA: Diagnosis not present

## 2017-07-27 DIAGNOSIS — N183 Chronic kidney disease, stage 3 (moderate): Secondary | ICD-10-CM | POA: Diagnosis not present

## 2017-07-27 DIAGNOSIS — I313 Pericardial effusion (noninflammatory): Secondary | ICD-10-CM | POA: Diagnosis not present

## 2017-07-27 DIAGNOSIS — I2510 Atherosclerotic heart disease of native coronary artery without angina pectoris: Secondary | ICD-10-CM | POA: Diagnosis not present

## 2017-07-27 DIAGNOSIS — I130 Hypertensive heart and chronic kidney disease with heart failure and stage 1 through stage 4 chronic kidney disease, or unspecified chronic kidney disease: Secondary | ICD-10-CM | POA: Diagnosis not present

## 2017-07-27 DIAGNOSIS — D631 Anemia in chronic kidney disease: Secondary | ICD-10-CM | POA: Diagnosis not present

## 2017-07-27 DIAGNOSIS — I5042 Chronic combined systolic (congestive) and diastolic (congestive) heart failure: Secondary | ICD-10-CM | POA: Diagnosis not present

## 2017-07-31 DIAGNOSIS — I5042 Chronic combined systolic (congestive) and diastolic (congestive) heart failure: Secondary | ICD-10-CM | POA: Diagnosis not present

## 2017-07-31 DIAGNOSIS — I2510 Atherosclerotic heart disease of native coronary artery without angina pectoris: Secondary | ICD-10-CM | POA: Diagnosis not present

## 2017-07-31 DIAGNOSIS — I313 Pericardial effusion (noninflammatory): Secondary | ICD-10-CM | POA: Diagnosis not present

## 2017-07-31 DIAGNOSIS — I130 Hypertensive heart and chronic kidney disease with heart failure and stage 1 through stage 4 chronic kidney disease, or unspecified chronic kidney disease: Secondary | ICD-10-CM | POA: Diagnosis not present

## 2017-07-31 DIAGNOSIS — N183 Chronic kidney disease, stage 3 (moderate): Secondary | ICD-10-CM | POA: Diagnosis not present

## 2017-07-31 DIAGNOSIS — D631 Anemia in chronic kidney disease: Secondary | ICD-10-CM | POA: Diagnosis not present

## 2017-08-03 DIAGNOSIS — N183 Chronic kidney disease, stage 3 (moderate): Secondary | ICD-10-CM | POA: Diagnosis not present

## 2017-08-03 DIAGNOSIS — I130 Hypertensive heart and chronic kidney disease with heart failure and stage 1 through stage 4 chronic kidney disease, or unspecified chronic kidney disease: Secondary | ICD-10-CM | POA: Diagnosis not present

## 2017-08-03 DIAGNOSIS — I5042 Chronic combined systolic (congestive) and diastolic (congestive) heart failure: Secondary | ICD-10-CM | POA: Diagnosis not present

## 2017-08-03 DIAGNOSIS — I2510 Atherosclerotic heart disease of native coronary artery without angina pectoris: Secondary | ICD-10-CM | POA: Diagnosis not present

## 2017-08-03 DIAGNOSIS — I313 Pericardial effusion (noninflammatory): Secondary | ICD-10-CM | POA: Diagnosis not present

## 2017-08-03 DIAGNOSIS — D631 Anemia in chronic kidney disease: Secondary | ICD-10-CM | POA: Diagnosis not present

## 2017-08-04 DIAGNOSIS — Z8551 Personal history of malignant neoplasm of bladder: Secondary | ICD-10-CM | POA: Diagnosis not present

## 2017-08-04 DIAGNOSIS — N183 Chronic kidney disease, stage 3 (moderate): Secondary | ICD-10-CM | POA: Diagnosis not present

## 2017-08-04 DIAGNOSIS — F0280 Dementia in other diseases classified elsewhere without behavioral disturbance: Secondary | ICD-10-CM | POA: Diagnosis not present

## 2017-08-04 DIAGNOSIS — I5042 Chronic combined systolic (congestive) and diastolic (congestive) heart failure: Secondary | ICD-10-CM | POA: Diagnosis not present

## 2017-08-04 DIAGNOSIS — I2510 Atherosclerotic heart disease of native coronary artery without angina pectoris: Secondary | ICD-10-CM | POA: Diagnosis not present

## 2017-08-04 DIAGNOSIS — D631 Anemia in chronic kidney disease: Secondary | ICD-10-CM | POA: Diagnosis not present

## 2017-08-04 DIAGNOSIS — I130 Hypertensive heart and chronic kidney disease with heart failure and stage 1 through stage 4 chronic kidney disease, or unspecified chronic kidney disease: Secondary | ICD-10-CM | POA: Diagnosis not present

## 2017-08-04 DIAGNOSIS — G301 Alzheimer's disease with late onset: Secondary | ICD-10-CM | POA: Diagnosis not present

## 2017-08-04 DIAGNOSIS — Z8546 Personal history of malignant neoplasm of prostate: Secondary | ICD-10-CM | POA: Diagnosis not present

## 2017-08-04 DIAGNOSIS — I313 Pericardial effusion (noninflammatory): Secondary | ICD-10-CM | POA: Diagnosis not present

## 2017-08-07 DIAGNOSIS — I5042 Chronic combined systolic (congestive) and diastolic (congestive) heart failure: Secondary | ICD-10-CM | POA: Diagnosis not present

## 2017-08-07 DIAGNOSIS — I2510 Atherosclerotic heart disease of native coronary artery without angina pectoris: Secondary | ICD-10-CM | POA: Diagnosis not present

## 2017-08-07 DIAGNOSIS — I313 Pericardial effusion (noninflammatory): Secondary | ICD-10-CM | POA: Diagnosis not present

## 2017-08-07 DIAGNOSIS — D631 Anemia in chronic kidney disease: Secondary | ICD-10-CM | POA: Diagnosis not present

## 2017-08-07 DIAGNOSIS — I130 Hypertensive heart and chronic kidney disease with heart failure and stage 1 through stage 4 chronic kidney disease, or unspecified chronic kidney disease: Secondary | ICD-10-CM | POA: Diagnosis not present

## 2017-08-07 DIAGNOSIS — N183 Chronic kidney disease, stage 3 (moderate): Secondary | ICD-10-CM | POA: Diagnosis not present

## 2017-08-09 DIAGNOSIS — I130 Hypertensive heart and chronic kidney disease with heart failure and stage 1 through stage 4 chronic kidney disease, or unspecified chronic kidney disease: Secondary | ICD-10-CM | POA: Diagnosis not present

## 2017-08-09 DIAGNOSIS — N183 Chronic kidney disease, stage 3 (moderate): Secondary | ICD-10-CM | POA: Diagnosis not present

## 2017-08-09 DIAGNOSIS — I5042 Chronic combined systolic (congestive) and diastolic (congestive) heart failure: Secondary | ICD-10-CM | POA: Diagnosis not present

## 2017-08-09 DIAGNOSIS — I313 Pericardial effusion (noninflammatory): Secondary | ICD-10-CM | POA: Diagnosis not present

## 2017-08-09 DIAGNOSIS — D631 Anemia in chronic kidney disease: Secondary | ICD-10-CM | POA: Diagnosis not present

## 2017-08-09 DIAGNOSIS — I2510 Atherosclerotic heart disease of native coronary artery without angina pectoris: Secondary | ICD-10-CM | POA: Diagnosis not present

## 2017-08-10 DIAGNOSIS — N183 Chronic kidney disease, stage 3 (moderate): Secondary | ICD-10-CM | POA: Diagnosis not present

## 2017-08-10 DIAGNOSIS — I5042 Chronic combined systolic (congestive) and diastolic (congestive) heart failure: Secondary | ICD-10-CM | POA: Diagnosis not present

## 2017-08-10 DIAGNOSIS — D631 Anemia in chronic kidney disease: Secondary | ICD-10-CM | POA: Diagnosis not present

## 2017-08-10 DIAGNOSIS — I130 Hypertensive heart and chronic kidney disease with heart failure and stage 1 through stage 4 chronic kidney disease, or unspecified chronic kidney disease: Secondary | ICD-10-CM | POA: Diagnosis not present

## 2017-08-10 DIAGNOSIS — I313 Pericardial effusion (noninflammatory): Secondary | ICD-10-CM | POA: Diagnosis not present

## 2017-08-10 DIAGNOSIS — I2510 Atherosclerotic heart disease of native coronary artery without angina pectoris: Secondary | ICD-10-CM | POA: Diagnosis not present

## 2017-08-11 DIAGNOSIS — I2510 Atherosclerotic heart disease of native coronary artery without angina pectoris: Secondary | ICD-10-CM | POA: Diagnosis not present

## 2017-08-11 DIAGNOSIS — D631 Anemia in chronic kidney disease: Secondary | ICD-10-CM | POA: Diagnosis not present

## 2017-08-11 DIAGNOSIS — I130 Hypertensive heart and chronic kidney disease with heart failure and stage 1 through stage 4 chronic kidney disease, or unspecified chronic kidney disease: Secondary | ICD-10-CM | POA: Diagnosis not present

## 2017-08-11 DIAGNOSIS — I5042 Chronic combined systolic (congestive) and diastolic (congestive) heart failure: Secondary | ICD-10-CM | POA: Diagnosis not present

## 2017-08-11 DIAGNOSIS — I313 Pericardial effusion (noninflammatory): Secondary | ICD-10-CM | POA: Diagnosis not present

## 2017-08-11 DIAGNOSIS — N183 Chronic kidney disease, stage 3 (moderate): Secondary | ICD-10-CM | POA: Diagnosis not present

## 2017-08-12 DIAGNOSIS — I130 Hypertensive heart and chronic kidney disease with heart failure and stage 1 through stage 4 chronic kidney disease, or unspecified chronic kidney disease: Secondary | ICD-10-CM | POA: Diagnosis not present

## 2017-08-12 DIAGNOSIS — I2510 Atherosclerotic heart disease of native coronary artery without angina pectoris: Secondary | ICD-10-CM | POA: Diagnosis not present

## 2017-08-12 DIAGNOSIS — D631 Anemia in chronic kidney disease: Secondary | ICD-10-CM | POA: Diagnosis not present

## 2017-08-12 DIAGNOSIS — I313 Pericardial effusion (noninflammatory): Secondary | ICD-10-CM | POA: Diagnosis not present

## 2017-08-12 DIAGNOSIS — N183 Chronic kidney disease, stage 3 (moderate): Secondary | ICD-10-CM | POA: Diagnosis not present

## 2017-08-12 DIAGNOSIS — I5042 Chronic combined systolic (congestive) and diastolic (congestive) heart failure: Secondary | ICD-10-CM | POA: Diagnosis not present

## 2017-08-13 DIAGNOSIS — I2510 Atherosclerotic heart disease of native coronary artery without angina pectoris: Secondary | ICD-10-CM | POA: Diagnosis not present

## 2017-08-13 DIAGNOSIS — D631 Anemia in chronic kidney disease: Secondary | ICD-10-CM | POA: Diagnosis not present

## 2017-08-13 DIAGNOSIS — I5042 Chronic combined systolic (congestive) and diastolic (congestive) heart failure: Secondary | ICD-10-CM | POA: Diagnosis not present

## 2017-08-13 DIAGNOSIS — I313 Pericardial effusion (noninflammatory): Secondary | ICD-10-CM | POA: Diagnosis not present

## 2017-08-13 DIAGNOSIS — I130 Hypertensive heart and chronic kidney disease with heart failure and stage 1 through stage 4 chronic kidney disease, or unspecified chronic kidney disease: Secondary | ICD-10-CM | POA: Diagnosis not present

## 2017-08-13 DIAGNOSIS — N183 Chronic kidney disease, stage 3 (moderate): Secondary | ICD-10-CM | POA: Diagnosis not present

## 2017-08-14 DIAGNOSIS — N183 Chronic kidney disease, stage 3 (moderate): Secondary | ICD-10-CM | POA: Diagnosis not present

## 2017-08-14 DIAGNOSIS — I130 Hypertensive heart and chronic kidney disease with heart failure and stage 1 through stage 4 chronic kidney disease, or unspecified chronic kidney disease: Secondary | ICD-10-CM | POA: Diagnosis not present

## 2017-08-14 DIAGNOSIS — I5042 Chronic combined systolic (congestive) and diastolic (congestive) heart failure: Secondary | ICD-10-CM | POA: Diagnosis not present

## 2017-08-14 DIAGNOSIS — D631 Anemia in chronic kidney disease: Secondary | ICD-10-CM | POA: Diagnosis not present

## 2017-08-14 DIAGNOSIS — I313 Pericardial effusion (noninflammatory): Secondary | ICD-10-CM | POA: Diagnosis not present

## 2017-08-14 DIAGNOSIS — I2510 Atherosclerotic heart disease of native coronary artery without angina pectoris: Secondary | ICD-10-CM | POA: Diagnosis not present

## 2017-08-15 DIAGNOSIS — D631 Anemia in chronic kidney disease: Secondary | ICD-10-CM | POA: Diagnosis not present

## 2017-08-15 DIAGNOSIS — I5042 Chronic combined systolic (congestive) and diastolic (congestive) heart failure: Secondary | ICD-10-CM | POA: Diagnosis not present

## 2017-08-15 DIAGNOSIS — I130 Hypertensive heart and chronic kidney disease with heart failure and stage 1 through stage 4 chronic kidney disease, or unspecified chronic kidney disease: Secondary | ICD-10-CM | POA: Diagnosis not present

## 2017-08-15 DIAGNOSIS — N183 Chronic kidney disease, stage 3 (moderate): Secondary | ICD-10-CM | POA: Diagnosis not present

## 2017-08-15 DIAGNOSIS — I313 Pericardial effusion (noninflammatory): Secondary | ICD-10-CM | POA: Diagnosis not present

## 2017-08-15 DIAGNOSIS — I2510 Atherosclerotic heart disease of native coronary artery without angina pectoris: Secondary | ICD-10-CM | POA: Diagnosis not present

## 2017-08-22 DIAGNOSIS — I2510 Atherosclerotic heart disease of native coronary artery without angina pectoris: Secondary | ICD-10-CM | POA: Diagnosis not present

## 2017-08-22 DIAGNOSIS — I313 Pericardial effusion (noninflammatory): Secondary | ICD-10-CM | POA: Diagnosis not present

## 2017-08-22 DIAGNOSIS — N183 Chronic kidney disease, stage 3 (moderate): Secondary | ICD-10-CM | POA: Diagnosis not present

## 2017-08-22 DIAGNOSIS — D631 Anemia in chronic kidney disease: Secondary | ICD-10-CM | POA: Diagnosis not present

## 2017-08-22 DIAGNOSIS — I130 Hypertensive heart and chronic kidney disease with heart failure and stage 1 through stage 4 chronic kidney disease, or unspecified chronic kidney disease: Secondary | ICD-10-CM | POA: Diagnosis not present

## 2017-08-22 DIAGNOSIS — I5042 Chronic combined systolic (congestive) and diastolic (congestive) heart failure: Secondary | ICD-10-CM | POA: Diagnosis not present

## 2017-08-29 DIAGNOSIS — I130 Hypertensive heart and chronic kidney disease with heart failure and stage 1 through stage 4 chronic kidney disease, or unspecified chronic kidney disease: Secondary | ICD-10-CM | POA: Diagnosis not present

## 2017-08-29 DIAGNOSIS — I5042 Chronic combined systolic (congestive) and diastolic (congestive) heart failure: Secondary | ICD-10-CM | POA: Diagnosis not present

## 2017-08-29 DIAGNOSIS — I313 Pericardial effusion (noninflammatory): Secondary | ICD-10-CM | POA: Diagnosis not present

## 2017-08-29 DIAGNOSIS — D631 Anemia in chronic kidney disease: Secondary | ICD-10-CM | POA: Diagnosis not present

## 2017-08-29 DIAGNOSIS — N183 Chronic kidney disease, stage 3 (moderate): Secondary | ICD-10-CM | POA: Diagnosis not present

## 2017-08-29 DIAGNOSIS — I2510 Atherosclerotic heart disease of native coronary artery without angina pectoris: Secondary | ICD-10-CM | POA: Diagnosis not present

## 2017-09-03 DIAGNOSIS — G301 Alzheimer's disease with late onset: Secondary | ICD-10-CM | POA: Diagnosis not present

## 2017-09-03 DIAGNOSIS — F0280 Dementia in other diseases classified elsewhere without behavioral disturbance: Secondary | ICD-10-CM | POA: Diagnosis not present

## 2017-09-03 DIAGNOSIS — I313 Pericardial effusion (noninflammatory): Secondary | ICD-10-CM | POA: Diagnosis not present

## 2017-09-03 DIAGNOSIS — Z8546 Personal history of malignant neoplasm of prostate: Secondary | ICD-10-CM | POA: Diagnosis not present

## 2017-09-03 DIAGNOSIS — I2510 Atherosclerotic heart disease of native coronary artery without angina pectoris: Secondary | ICD-10-CM | POA: Diagnosis not present

## 2017-09-03 DIAGNOSIS — I5042 Chronic combined systolic (congestive) and diastolic (congestive) heart failure: Secondary | ICD-10-CM | POA: Diagnosis not present

## 2017-09-03 DIAGNOSIS — Z8551 Personal history of malignant neoplasm of bladder: Secondary | ICD-10-CM | POA: Diagnosis not present

## 2017-09-03 DIAGNOSIS — N183 Chronic kidney disease, stage 3 (moderate): Secondary | ICD-10-CM | POA: Diagnosis not present

## 2017-09-03 DIAGNOSIS — I130 Hypertensive heart and chronic kidney disease with heart failure and stage 1 through stage 4 chronic kidney disease, or unspecified chronic kidney disease: Secondary | ICD-10-CM | POA: Diagnosis not present

## 2017-09-03 DIAGNOSIS — D631 Anemia in chronic kidney disease: Secondary | ICD-10-CM | POA: Diagnosis not present

## 2017-09-04 DIAGNOSIS — I130 Hypertensive heart and chronic kidney disease with heart failure and stage 1 through stage 4 chronic kidney disease, or unspecified chronic kidney disease: Secondary | ICD-10-CM | POA: Diagnosis not present

## 2017-09-04 DIAGNOSIS — I2510 Atherosclerotic heart disease of native coronary artery without angina pectoris: Secondary | ICD-10-CM | POA: Diagnosis not present

## 2017-09-04 DIAGNOSIS — D631 Anemia in chronic kidney disease: Secondary | ICD-10-CM | POA: Diagnosis not present

## 2017-09-04 DIAGNOSIS — N183 Chronic kidney disease, stage 3 (moderate): Secondary | ICD-10-CM | POA: Diagnosis not present

## 2017-09-04 DIAGNOSIS — I313 Pericardial effusion (noninflammatory): Secondary | ICD-10-CM | POA: Diagnosis not present

## 2017-09-04 DIAGNOSIS — I5042 Chronic combined systolic (congestive) and diastolic (congestive) heart failure: Secondary | ICD-10-CM | POA: Diagnosis not present

## 2017-09-08 DIAGNOSIS — I130 Hypertensive heart and chronic kidney disease with heart failure and stage 1 through stage 4 chronic kidney disease, or unspecified chronic kidney disease: Secondary | ICD-10-CM | POA: Diagnosis not present

## 2017-09-08 DIAGNOSIS — I5042 Chronic combined systolic (congestive) and diastolic (congestive) heart failure: Secondary | ICD-10-CM | POA: Diagnosis not present

## 2017-09-08 DIAGNOSIS — N183 Chronic kidney disease, stage 3 (moderate): Secondary | ICD-10-CM | POA: Diagnosis not present

## 2017-09-08 DIAGNOSIS — I313 Pericardial effusion (noninflammatory): Secondary | ICD-10-CM | POA: Diagnosis not present

## 2017-09-08 DIAGNOSIS — I2510 Atherosclerotic heart disease of native coronary artery without angina pectoris: Secondary | ICD-10-CM | POA: Diagnosis not present

## 2017-09-08 DIAGNOSIS — D631 Anemia in chronic kidney disease: Secondary | ICD-10-CM | POA: Diagnosis not present

## 2017-09-10 DIAGNOSIS — N183 Chronic kidney disease, stage 3 (moderate): Secondary | ICD-10-CM | POA: Diagnosis not present

## 2017-09-10 DIAGNOSIS — I2510 Atherosclerotic heart disease of native coronary artery without angina pectoris: Secondary | ICD-10-CM | POA: Diagnosis not present

## 2017-09-10 DIAGNOSIS — I5042 Chronic combined systolic (congestive) and diastolic (congestive) heart failure: Secondary | ICD-10-CM | POA: Diagnosis not present

## 2017-09-10 DIAGNOSIS — D631 Anemia in chronic kidney disease: Secondary | ICD-10-CM | POA: Diagnosis not present

## 2017-09-10 DIAGNOSIS — I313 Pericardial effusion (noninflammatory): Secondary | ICD-10-CM | POA: Diagnosis not present

## 2017-09-10 DIAGNOSIS — I130 Hypertensive heart and chronic kidney disease with heart failure and stage 1 through stage 4 chronic kidney disease, or unspecified chronic kidney disease: Secondary | ICD-10-CM | POA: Diagnosis not present

## 2017-09-11 DIAGNOSIS — I130 Hypertensive heart and chronic kidney disease with heart failure and stage 1 through stage 4 chronic kidney disease, or unspecified chronic kidney disease: Secondary | ICD-10-CM | POA: Diagnosis not present

## 2017-09-11 DIAGNOSIS — I5042 Chronic combined systolic (congestive) and diastolic (congestive) heart failure: Secondary | ICD-10-CM | POA: Diagnosis not present

## 2017-09-11 DIAGNOSIS — I2510 Atherosclerotic heart disease of native coronary artery without angina pectoris: Secondary | ICD-10-CM | POA: Diagnosis not present

## 2017-09-11 DIAGNOSIS — N183 Chronic kidney disease, stage 3 (moderate): Secondary | ICD-10-CM | POA: Diagnosis not present

## 2017-09-11 DIAGNOSIS — D631 Anemia in chronic kidney disease: Secondary | ICD-10-CM | POA: Diagnosis not present

## 2017-09-11 DIAGNOSIS — I313 Pericardial effusion (noninflammatory): Secondary | ICD-10-CM | POA: Diagnosis not present

## 2017-09-15 DIAGNOSIS — N183 Chronic kidney disease, stage 3 (moderate): Secondary | ICD-10-CM | POA: Diagnosis not present

## 2017-09-15 DIAGNOSIS — I2510 Atherosclerotic heart disease of native coronary artery without angina pectoris: Secondary | ICD-10-CM | POA: Diagnosis not present

## 2017-09-15 DIAGNOSIS — I313 Pericardial effusion (noninflammatory): Secondary | ICD-10-CM | POA: Diagnosis not present

## 2017-09-15 DIAGNOSIS — I5042 Chronic combined systolic (congestive) and diastolic (congestive) heart failure: Secondary | ICD-10-CM | POA: Diagnosis not present

## 2017-09-15 DIAGNOSIS — D631 Anemia in chronic kidney disease: Secondary | ICD-10-CM | POA: Diagnosis not present

## 2017-09-15 DIAGNOSIS — I130 Hypertensive heart and chronic kidney disease with heart failure and stage 1 through stage 4 chronic kidney disease, or unspecified chronic kidney disease: Secondary | ICD-10-CM | POA: Diagnosis not present

## 2017-09-16 DIAGNOSIS — D631 Anemia in chronic kidney disease: Secondary | ICD-10-CM | POA: Diagnosis not present

## 2017-09-16 DIAGNOSIS — I313 Pericardial effusion (noninflammatory): Secondary | ICD-10-CM | POA: Diagnosis not present

## 2017-09-16 DIAGNOSIS — I2510 Atherosclerotic heart disease of native coronary artery without angina pectoris: Secondary | ICD-10-CM | POA: Diagnosis not present

## 2017-09-16 DIAGNOSIS — I130 Hypertensive heart and chronic kidney disease with heart failure and stage 1 through stage 4 chronic kidney disease, or unspecified chronic kidney disease: Secondary | ICD-10-CM | POA: Diagnosis not present

## 2017-09-16 DIAGNOSIS — N183 Chronic kidney disease, stage 3 (moderate): Secondary | ICD-10-CM | POA: Diagnosis not present

## 2017-09-16 DIAGNOSIS — I5042 Chronic combined systolic (congestive) and diastolic (congestive) heart failure: Secondary | ICD-10-CM | POA: Diagnosis not present

## 2017-09-18 DIAGNOSIS — I5042 Chronic combined systolic (congestive) and diastolic (congestive) heart failure: Secondary | ICD-10-CM | POA: Diagnosis not present

## 2017-09-18 DIAGNOSIS — I2510 Atherosclerotic heart disease of native coronary artery without angina pectoris: Secondary | ICD-10-CM | POA: Diagnosis not present

## 2017-09-18 DIAGNOSIS — I313 Pericardial effusion (noninflammatory): Secondary | ICD-10-CM | POA: Diagnosis not present

## 2017-09-18 DIAGNOSIS — D631 Anemia in chronic kidney disease: Secondary | ICD-10-CM | POA: Diagnosis not present

## 2017-09-18 DIAGNOSIS — I130 Hypertensive heart and chronic kidney disease with heart failure and stage 1 through stage 4 chronic kidney disease, or unspecified chronic kidney disease: Secondary | ICD-10-CM | POA: Diagnosis not present

## 2017-09-18 DIAGNOSIS — N183 Chronic kidney disease, stage 3 (moderate): Secondary | ICD-10-CM | POA: Diagnosis not present

## 2017-09-21 IMAGING — CR DG CHEST 2V
2 series · 2 of 2 positions shown · non-contrast
Comparison: 02/25/2016

CLINICAL DATA: Pleural effusion evaluation.

EXAM:
CHEST  2 VIEW

[chest lat]
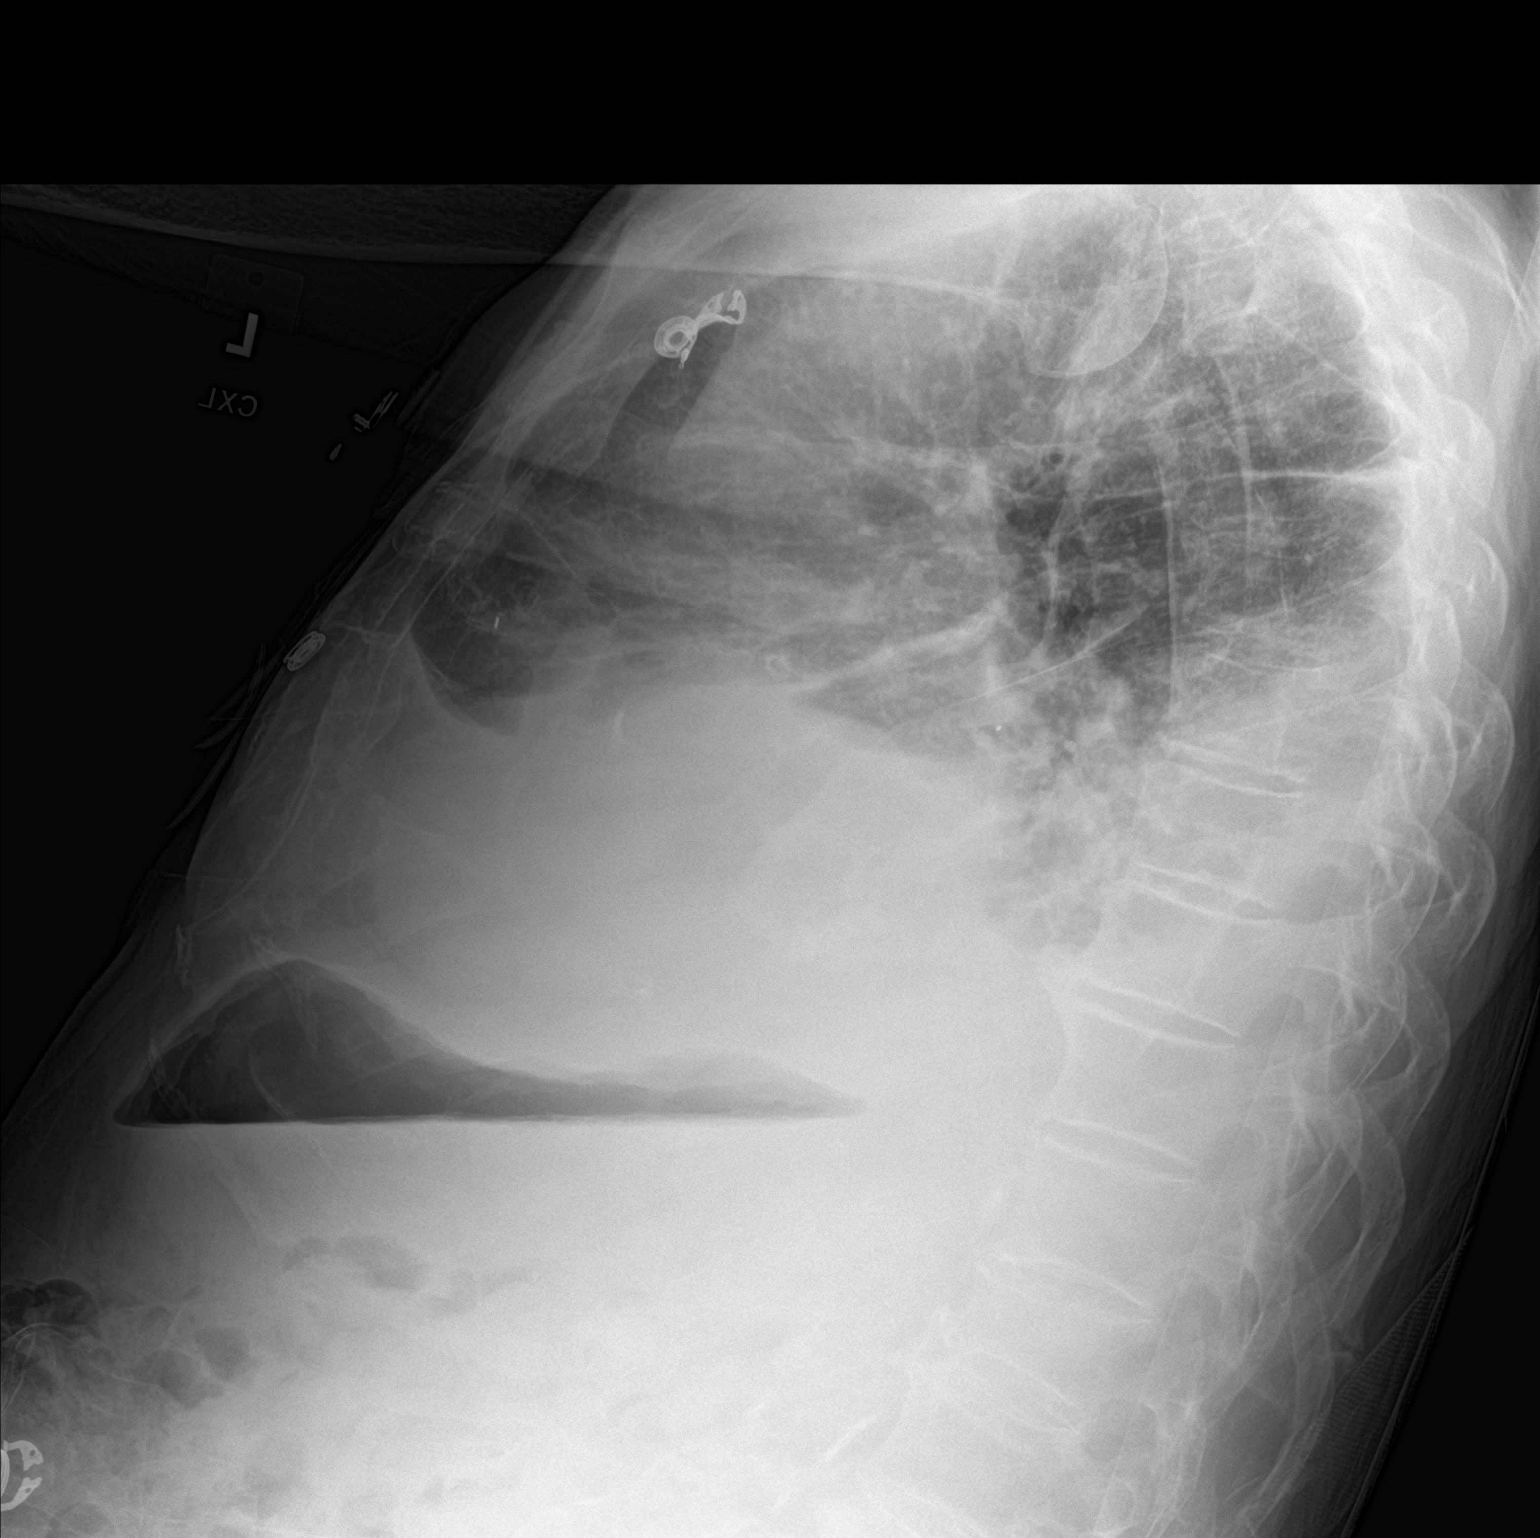

[chest ap]
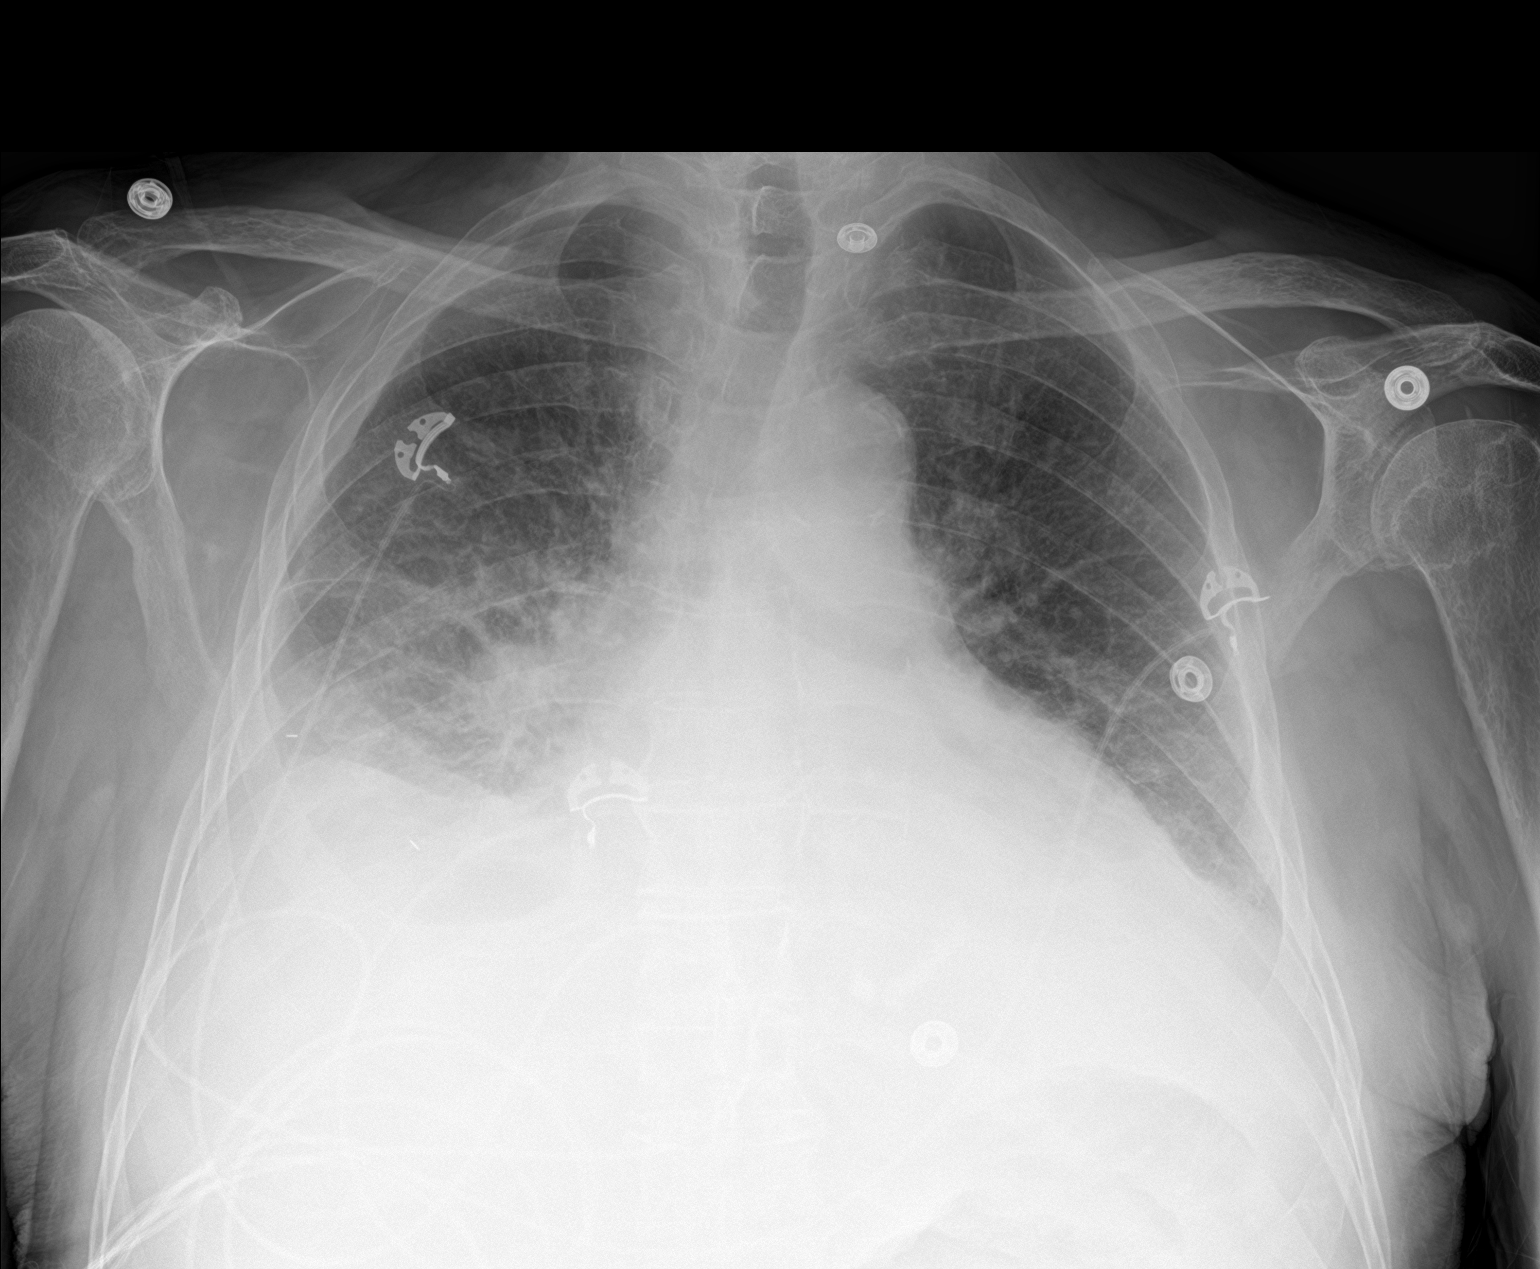

[2 of 2 positions shown; findings below may reference images not displayed]

FINDINGS: Examination demonstrates evidence of moderate size bilateral pleural
effusions right greater than left without significant change. Likely
associated atelectasis in the lung bases. Cannot exclude infection
in the lung bases. Mild prominence of the right hilar region. Stable
cardiomegaly. Calcified plaque over the thoracic aorta. Degenerative
change of the spine. Mild compression fracture in the region of the
thoracolumbar junction unchanged.
IMPRESSION: Evidence of moderate size bilateral pleural effusions right greater
than left likely with associated basilar atelectasis unchanged.
Cannot exclude infection in the lung bases. Mild prominence of the
right hilum. Recommend follow-up to resolution.

Cardiomegaly.

Aortic atherosclerosis.

Stable compression fracture in the region of the thoracolumbar
junction.

## 2017-09-22 DIAGNOSIS — I5042 Chronic combined systolic (congestive) and diastolic (congestive) heart failure: Secondary | ICD-10-CM | POA: Diagnosis not present

## 2017-09-22 DIAGNOSIS — I2510 Atherosclerotic heart disease of native coronary artery without angina pectoris: Secondary | ICD-10-CM | POA: Diagnosis not present

## 2017-09-22 DIAGNOSIS — I313 Pericardial effusion (noninflammatory): Secondary | ICD-10-CM | POA: Diagnosis not present

## 2017-09-22 DIAGNOSIS — N183 Chronic kidney disease, stage 3 (moderate): Secondary | ICD-10-CM | POA: Diagnosis not present

## 2017-09-22 DIAGNOSIS — I130 Hypertensive heart and chronic kidney disease with heart failure and stage 1 through stage 4 chronic kidney disease, or unspecified chronic kidney disease: Secondary | ICD-10-CM | POA: Diagnosis not present

## 2017-09-22 DIAGNOSIS — D631 Anemia in chronic kidney disease: Secondary | ICD-10-CM | POA: Diagnosis not present

## 2017-09-23 DIAGNOSIS — I130 Hypertensive heart and chronic kidney disease with heart failure and stage 1 through stage 4 chronic kidney disease, or unspecified chronic kidney disease: Secondary | ICD-10-CM | POA: Diagnosis not present

## 2017-09-23 DIAGNOSIS — I313 Pericardial effusion (noninflammatory): Secondary | ICD-10-CM | POA: Diagnosis not present

## 2017-09-23 DIAGNOSIS — D631 Anemia in chronic kidney disease: Secondary | ICD-10-CM | POA: Diagnosis not present

## 2017-09-23 DIAGNOSIS — I2510 Atherosclerotic heart disease of native coronary artery without angina pectoris: Secondary | ICD-10-CM | POA: Diagnosis not present

## 2017-09-23 DIAGNOSIS — N183 Chronic kidney disease, stage 3 (moderate): Secondary | ICD-10-CM | POA: Diagnosis not present

## 2017-09-23 DIAGNOSIS — I5042 Chronic combined systolic (congestive) and diastolic (congestive) heart failure: Secondary | ICD-10-CM | POA: Diagnosis not present

## 2017-09-25 DIAGNOSIS — I130 Hypertensive heart and chronic kidney disease with heart failure and stage 1 through stage 4 chronic kidney disease, or unspecified chronic kidney disease: Secondary | ICD-10-CM | POA: Diagnosis not present

## 2017-09-25 DIAGNOSIS — N183 Chronic kidney disease, stage 3 (moderate): Secondary | ICD-10-CM | POA: Diagnosis not present

## 2017-09-25 DIAGNOSIS — I313 Pericardial effusion (noninflammatory): Secondary | ICD-10-CM | POA: Diagnosis not present

## 2017-09-25 DIAGNOSIS — D631 Anemia in chronic kidney disease: Secondary | ICD-10-CM | POA: Diagnosis not present

## 2017-09-25 DIAGNOSIS — I2510 Atherosclerotic heart disease of native coronary artery without angina pectoris: Secondary | ICD-10-CM | POA: Diagnosis not present

## 2017-09-25 DIAGNOSIS — I5042 Chronic combined systolic (congestive) and diastolic (congestive) heart failure: Secondary | ICD-10-CM | POA: Diagnosis not present

## 2017-09-29 DIAGNOSIS — I130 Hypertensive heart and chronic kidney disease with heart failure and stage 1 through stage 4 chronic kidney disease, or unspecified chronic kidney disease: Secondary | ICD-10-CM | POA: Diagnosis not present

## 2017-09-29 DIAGNOSIS — I5042 Chronic combined systolic (congestive) and diastolic (congestive) heart failure: Secondary | ICD-10-CM | POA: Diagnosis not present

## 2017-09-29 DIAGNOSIS — D631 Anemia in chronic kidney disease: Secondary | ICD-10-CM | POA: Diagnosis not present

## 2017-09-29 DIAGNOSIS — I313 Pericardial effusion (noninflammatory): Secondary | ICD-10-CM | POA: Diagnosis not present

## 2017-09-29 DIAGNOSIS — N183 Chronic kidney disease, stage 3 (moderate): Secondary | ICD-10-CM | POA: Diagnosis not present

## 2017-09-29 DIAGNOSIS — I2510 Atherosclerotic heart disease of native coronary artery without angina pectoris: Secondary | ICD-10-CM | POA: Diagnosis not present

## 2017-09-30 DIAGNOSIS — I5042 Chronic combined systolic (congestive) and diastolic (congestive) heart failure: Secondary | ICD-10-CM | POA: Diagnosis not present

## 2017-09-30 DIAGNOSIS — N183 Chronic kidney disease, stage 3 (moderate): Secondary | ICD-10-CM | POA: Diagnosis not present

## 2017-09-30 DIAGNOSIS — I313 Pericardial effusion (noninflammatory): Secondary | ICD-10-CM | POA: Diagnosis not present

## 2017-09-30 DIAGNOSIS — I2510 Atherosclerotic heart disease of native coronary artery without angina pectoris: Secondary | ICD-10-CM | POA: Diagnosis not present

## 2017-09-30 DIAGNOSIS — I130 Hypertensive heart and chronic kidney disease with heart failure and stage 1 through stage 4 chronic kidney disease, or unspecified chronic kidney disease: Secondary | ICD-10-CM | POA: Diagnosis not present

## 2017-09-30 DIAGNOSIS — D631 Anemia in chronic kidney disease: Secondary | ICD-10-CM | POA: Diagnosis not present

## 2017-10-02 DIAGNOSIS — I130 Hypertensive heart and chronic kidney disease with heart failure and stage 1 through stage 4 chronic kidney disease, or unspecified chronic kidney disease: Secondary | ICD-10-CM | POA: Diagnosis not present

## 2017-10-02 DIAGNOSIS — I313 Pericardial effusion (noninflammatory): Secondary | ICD-10-CM | POA: Diagnosis not present

## 2017-10-02 DIAGNOSIS — D631 Anemia in chronic kidney disease: Secondary | ICD-10-CM | POA: Diagnosis not present

## 2017-10-02 DIAGNOSIS — I5042 Chronic combined systolic (congestive) and diastolic (congestive) heart failure: Secondary | ICD-10-CM | POA: Diagnosis not present

## 2017-10-02 DIAGNOSIS — N183 Chronic kidney disease, stage 3 (moderate): Secondary | ICD-10-CM | POA: Diagnosis not present

## 2017-10-02 DIAGNOSIS — I2510 Atherosclerotic heart disease of native coronary artery without angina pectoris: Secondary | ICD-10-CM | POA: Diagnosis not present

## 2017-10-04 DIAGNOSIS — G301 Alzheimer's disease with late onset: Secondary | ICD-10-CM | POA: Diagnosis not present

## 2017-10-04 DIAGNOSIS — I130 Hypertensive heart and chronic kidney disease with heart failure and stage 1 through stage 4 chronic kidney disease, or unspecified chronic kidney disease: Secondary | ICD-10-CM | POA: Diagnosis not present

## 2017-10-04 DIAGNOSIS — F0280 Dementia in other diseases classified elsewhere without behavioral disturbance: Secondary | ICD-10-CM | POA: Diagnosis not present

## 2017-10-04 DIAGNOSIS — I5042 Chronic combined systolic (congestive) and diastolic (congestive) heart failure: Secondary | ICD-10-CM | POA: Diagnosis not present

## 2017-10-04 DIAGNOSIS — Z8546 Personal history of malignant neoplasm of prostate: Secondary | ICD-10-CM | POA: Diagnosis not present

## 2017-10-04 DIAGNOSIS — D631 Anemia in chronic kidney disease: Secondary | ICD-10-CM | POA: Diagnosis not present

## 2017-10-04 DIAGNOSIS — I313 Pericardial effusion (noninflammatory): Secondary | ICD-10-CM | POA: Diagnosis not present

## 2017-10-04 DIAGNOSIS — N183 Chronic kidney disease, stage 3 (moderate): Secondary | ICD-10-CM | POA: Diagnosis not present

## 2017-10-04 DIAGNOSIS — I2510 Atherosclerotic heart disease of native coronary artery without angina pectoris: Secondary | ICD-10-CM | POA: Diagnosis not present

## 2017-10-04 DIAGNOSIS — Z8551 Personal history of malignant neoplasm of bladder: Secondary | ICD-10-CM | POA: Diagnosis not present

## 2017-10-05 DIAGNOSIS — I130 Hypertensive heart and chronic kidney disease with heart failure and stage 1 through stage 4 chronic kidney disease, or unspecified chronic kidney disease: Secondary | ICD-10-CM | POA: Diagnosis not present

## 2017-10-05 DIAGNOSIS — D631 Anemia in chronic kidney disease: Secondary | ICD-10-CM | POA: Diagnosis not present

## 2017-10-05 DIAGNOSIS — N183 Chronic kidney disease, stage 3 (moderate): Secondary | ICD-10-CM | POA: Diagnosis not present

## 2017-10-05 DIAGNOSIS — I2510 Atherosclerotic heart disease of native coronary artery without angina pectoris: Secondary | ICD-10-CM | POA: Diagnosis not present

## 2017-10-05 DIAGNOSIS — I313 Pericardial effusion (noninflammatory): Secondary | ICD-10-CM | POA: Diagnosis not present

## 2017-10-05 DIAGNOSIS — I5042 Chronic combined systolic (congestive) and diastolic (congestive) heart failure: Secondary | ICD-10-CM | POA: Diagnosis not present

## 2017-10-06 DIAGNOSIS — N183 Chronic kidney disease, stage 3 (moderate): Secondary | ICD-10-CM | POA: Diagnosis not present

## 2017-10-06 DIAGNOSIS — D631 Anemia in chronic kidney disease: Secondary | ICD-10-CM | POA: Diagnosis not present

## 2017-10-06 DIAGNOSIS — I5042 Chronic combined systolic (congestive) and diastolic (congestive) heart failure: Secondary | ICD-10-CM | POA: Diagnosis not present

## 2017-10-06 DIAGNOSIS — I2510 Atherosclerotic heart disease of native coronary artery without angina pectoris: Secondary | ICD-10-CM | POA: Diagnosis not present

## 2017-10-06 DIAGNOSIS — I130 Hypertensive heart and chronic kidney disease with heart failure and stage 1 through stage 4 chronic kidney disease, or unspecified chronic kidney disease: Secondary | ICD-10-CM | POA: Diagnosis not present

## 2017-10-06 DIAGNOSIS — I313 Pericardial effusion (noninflammatory): Secondary | ICD-10-CM | POA: Diagnosis not present

## 2017-10-07 DIAGNOSIS — I313 Pericardial effusion (noninflammatory): Secondary | ICD-10-CM | POA: Diagnosis not present

## 2017-10-07 DIAGNOSIS — N183 Chronic kidney disease, stage 3 (moderate): Secondary | ICD-10-CM | POA: Diagnosis not present

## 2017-10-07 DIAGNOSIS — I5042 Chronic combined systolic (congestive) and diastolic (congestive) heart failure: Secondary | ICD-10-CM | POA: Diagnosis not present

## 2017-10-07 DIAGNOSIS — I2510 Atherosclerotic heart disease of native coronary artery without angina pectoris: Secondary | ICD-10-CM | POA: Diagnosis not present

## 2017-10-07 DIAGNOSIS — D631 Anemia in chronic kidney disease: Secondary | ICD-10-CM | POA: Diagnosis not present

## 2017-10-07 DIAGNOSIS — I130 Hypertensive heart and chronic kidney disease with heart failure and stage 1 through stage 4 chronic kidney disease, or unspecified chronic kidney disease: Secondary | ICD-10-CM | POA: Diagnosis not present

## 2017-10-08 DIAGNOSIS — I313 Pericardial effusion (noninflammatory): Secondary | ICD-10-CM | POA: Diagnosis not present

## 2017-10-08 DIAGNOSIS — N183 Chronic kidney disease, stage 3 (moderate): Secondary | ICD-10-CM | POA: Diagnosis not present

## 2017-10-08 DIAGNOSIS — I5042 Chronic combined systolic (congestive) and diastolic (congestive) heart failure: Secondary | ICD-10-CM | POA: Diagnosis not present

## 2017-10-08 DIAGNOSIS — D631 Anemia in chronic kidney disease: Secondary | ICD-10-CM | POA: Diagnosis not present

## 2017-10-08 DIAGNOSIS — I130 Hypertensive heart and chronic kidney disease with heart failure and stage 1 through stage 4 chronic kidney disease, or unspecified chronic kidney disease: Secondary | ICD-10-CM | POA: Diagnosis not present

## 2017-10-08 DIAGNOSIS — I2510 Atherosclerotic heart disease of native coronary artery without angina pectoris: Secondary | ICD-10-CM | POA: Diagnosis not present

## 2017-10-09 DIAGNOSIS — D631 Anemia in chronic kidney disease: Secondary | ICD-10-CM | POA: Diagnosis not present

## 2017-10-09 DIAGNOSIS — I5042 Chronic combined systolic (congestive) and diastolic (congestive) heart failure: Secondary | ICD-10-CM | POA: Diagnosis not present

## 2017-10-09 DIAGNOSIS — I2510 Atherosclerotic heart disease of native coronary artery without angina pectoris: Secondary | ICD-10-CM | POA: Diagnosis not present

## 2017-10-09 DIAGNOSIS — N183 Chronic kidney disease, stage 3 (moderate): Secondary | ICD-10-CM | POA: Diagnosis not present

## 2017-10-09 DIAGNOSIS — I130 Hypertensive heart and chronic kidney disease with heart failure and stage 1 through stage 4 chronic kidney disease, or unspecified chronic kidney disease: Secondary | ICD-10-CM | POA: Diagnosis not present

## 2017-10-09 DIAGNOSIS — I313 Pericardial effusion (noninflammatory): Secondary | ICD-10-CM | POA: Diagnosis not present

## 2017-10-13 DIAGNOSIS — N183 Chronic kidney disease, stage 3 (moderate): Secondary | ICD-10-CM | POA: Diagnosis not present

## 2017-10-13 DIAGNOSIS — I2510 Atherosclerotic heart disease of native coronary artery without angina pectoris: Secondary | ICD-10-CM | POA: Diagnosis not present

## 2017-10-13 DIAGNOSIS — I313 Pericardial effusion (noninflammatory): Secondary | ICD-10-CM | POA: Diagnosis not present

## 2017-10-13 DIAGNOSIS — D631 Anemia in chronic kidney disease: Secondary | ICD-10-CM | POA: Diagnosis not present

## 2017-10-13 DIAGNOSIS — I5042 Chronic combined systolic (congestive) and diastolic (congestive) heart failure: Secondary | ICD-10-CM | POA: Diagnosis not present

## 2017-10-13 DIAGNOSIS — I130 Hypertensive heart and chronic kidney disease with heart failure and stage 1 through stage 4 chronic kidney disease, or unspecified chronic kidney disease: Secondary | ICD-10-CM | POA: Diagnosis not present

## 2017-10-14 DIAGNOSIS — I5042 Chronic combined systolic (congestive) and diastolic (congestive) heart failure: Secondary | ICD-10-CM | POA: Diagnosis not present

## 2017-10-14 DIAGNOSIS — I130 Hypertensive heart and chronic kidney disease with heart failure and stage 1 through stage 4 chronic kidney disease, or unspecified chronic kidney disease: Secondary | ICD-10-CM | POA: Diagnosis not present

## 2017-10-14 DIAGNOSIS — D631 Anemia in chronic kidney disease: Secondary | ICD-10-CM | POA: Diagnosis not present

## 2017-10-14 DIAGNOSIS — N183 Chronic kidney disease, stage 3 (moderate): Secondary | ICD-10-CM | POA: Diagnosis not present

## 2017-10-14 DIAGNOSIS — I313 Pericardial effusion (noninflammatory): Secondary | ICD-10-CM | POA: Diagnosis not present

## 2017-10-14 DIAGNOSIS — I2510 Atherosclerotic heart disease of native coronary artery without angina pectoris: Secondary | ICD-10-CM | POA: Diagnosis not present

## 2017-10-16 DIAGNOSIS — I130 Hypertensive heart and chronic kidney disease with heart failure and stage 1 through stage 4 chronic kidney disease, or unspecified chronic kidney disease: Secondary | ICD-10-CM | POA: Diagnosis not present

## 2017-10-16 DIAGNOSIS — I5042 Chronic combined systolic (congestive) and diastolic (congestive) heart failure: Secondary | ICD-10-CM | POA: Diagnosis not present

## 2017-10-16 DIAGNOSIS — I313 Pericardial effusion (noninflammatory): Secondary | ICD-10-CM | POA: Diagnosis not present

## 2017-10-16 DIAGNOSIS — N183 Chronic kidney disease, stage 3 (moderate): Secondary | ICD-10-CM | POA: Diagnosis not present

## 2017-10-16 DIAGNOSIS — I2510 Atherosclerotic heart disease of native coronary artery without angina pectoris: Secondary | ICD-10-CM | POA: Diagnosis not present

## 2017-10-16 DIAGNOSIS — D631 Anemia in chronic kidney disease: Secondary | ICD-10-CM | POA: Diagnosis not present

## 2017-10-20 DIAGNOSIS — D631 Anemia in chronic kidney disease: Secondary | ICD-10-CM | POA: Diagnosis not present

## 2017-10-20 DIAGNOSIS — I313 Pericardial effusion (noninflammatory): Secondary | ICD-10-CM | POA: Diagnosis not present

## 2017-10-20 DIAGNOSIS — I130 Hypertensive heart and chronic kidney disease with heart failure and stage 1 through stage 4 chronic kidney disease, or unspecified chronic kidney disease: Secondary | ICD-10-CM | POA: Diagnosis not present

## 2017-10-20 DIAGNOSIS — I5042 Chronic combined systolic (congestive) and diastolic (congestive) heart failure: Secondary | ICD-10-CM | POA: Diagnosis not present

## 2017-10-20 DIAGNOSIS — N183 Chronic kidney disease, stage 3 (moderate): Secondary | ICD-10-CM | POA: Diagnosis not present

## 2017-10-20 DIAGNOSIS — I2510 Atherosclerotic heart disease of native coronary artery without angina pectoris: Secondary | ICD-10-CM | POA: Diagnosis not present

## 2017-10-21 DIAGNOSIS — I313 Pericardial effusion (noninflammatory): Secondary | ICD-10-CM | POA: Diagnosis not present

## 2017-10-21 DIAGNOSIS — I130 Hypertensive heart and chronic kidney disease with heart failure and stage 1 through stage 4 chronic kidney disease, or unspecified chronic kidney disease: Secondary | ICD-10-CM | POA: Diagnosis not present

## 2017-10-21 DIAGNOSIS — I5042 Chronic combined systolic (congestive) and diastolic (congestive) heart failure: Secondary | ICD-10-CM | POA: Diagnosis not present

## 2017-10-21 DIAGNOSIS — N183 Chronic kidney disease, stage 3 (moderate): Secondary | ICD-10-CM | POA: Diagnosis not present

## 2017-10-21 DIAGNOSIS — I2510 Atherosclerotic heart disease of native coronary artery without angina pectoris: Secondary | ICD-10-CM | POA: Diagnosis not present

## 2017-10-21 DIAGNOSIS — D631 Anemia in chronic kidney disease: Secondary | ICD-10-CM | POA: Diagnosis not present

## 2017-10-23 DIAGNOSIS — I2510 Atherosclerotic heart disease of native coronary artery without angina pectoris: Secondary | ICD-10-CM | POA: Diagnosis not present

## 2017-10-23 DIAGNOSIS — I313 Pericardial effusion (noninflammatory): Secondary | ICD-10-CM | POA: Diagnosis not present

## 2017-10-23 DIAGNOSIS — D631 Anemia in chronic kidney disease: Secondary | ICD-10-CM | POA: Diagnosis not present

## 2017-10-23 DIAGNOSIS — N183 Chronic kidney disease, stage 3 (moderate): Secondary | ICD-10-CM | POA: Diagnosis not present

## 2017-10-23 DIAGNOSIS — I5042 Chronic combined systolic (congestive) and diastolic (congestive) heart failure: Secondary | ICD-10-CM | POA: Diagnosis not present

## 2017-10-23 DIAGNOSIS — I130 Hypertensive heart and chronic kidney disease with heart failure and stage 1 through stage 4 chronic kidney disease, or unspecified chronic kidney disease: Secondary | ICD-10-CM | POA: Diagnosis not present

## 2017-10-27 DIAGNOSIS — I5042 Chronic combined systolic (congestive) and diastolic (congestive) heart failure: Secondary | ICD-10-CM | POA: Diagnosis not present

## 2017-10-27 DIAGNOSIS — I2510 Atherosclerotic heart disease of native coronary artery without angina pectoris: Secondary | ICD-10-CM | POA: Diagnosis not present

## 2017-10-27 DIAGNOSIS — I313 Pericardial effusion (noninflammatory): Secondary | ICD-10-CM | POA: Diagnosis not present

## 2017-10-27 DIAGNOSIS — D631 Anemia in chronic kidney disease: Secondary | ICD-10-CM | POA: Diagnosis not present

## 2017-10-27 DIAGNOSIS — N183 Chronic kidney disease, stage 3 (moderate): Secondary | ICD-10-CM | POA: Diagnosis not present

## 2017-10-27 DIAGNOSIS — I130 Hypertensive heart and chronic kidney disease with heart failure and stage 1 through stage 4 chronic kidney disease, or unspecified chronic kidney disease: Secondary | ICD-10-CM | POA: Diagnosis not present

## 2017-10-28 DIAGNOSIS — I5042 Chronic combined systolic (congestive) and diastolic (congestive) heart failure: Secondary | ICD-10-CM | POA: Diagnosis not present

## 2017-10-28 DIAGNOSIS — I313 Pericardial effusion (noninflammatory): Secondary | ICD-10-CM | POA: Diagnosis not present

## 2017-10-28 DIAGNOSIS — I2510 Atherosclerotic heart disease of native coronary artery without angina pectoris: Secondary | ICD-10-CM | POA: Diagnosis not present

## 2017-10-28 DIAGNOSIS — I130 Hypertensive heart and chronic kidney disease with heart failure and stage 1 through stage 4 chronic kidney disease, or unspecified chronic kidney disease: Secondary | ICD-10-CM | POA: Diagnosis not present

## 2017-10-28 DIAGNOSIS — N183 Chronic kidney disease, stage 3 (moderate): Secondary | ICD-10-CM | POA: Diagnosis not present

## 2017-10-28 DIAGNOSIS — D631 Anemia in chronic kidney disease: Secondary | ICD-10-CM | POA: Diagnosis not present

## 2017-10-30 DIAGNOSIS — I130 Hypertensive heart and chronic kidney disease with heart failure and stage 1 through stage 4 chronic kidney disease, or unspecified chronic kidney disease: Secondary | ICD-10-CM | POA: Diagnosis not present

## 2017-10-30 DIAGNOSIS — I2510 Atherosclerotic heart disease of native coronary artery without angina pectoris: Secondary | ICD-10-CM | POA: Diagnosis not present

## 2017-10-30 DIAGNOSIS — I5042 Chronic combined systolic (congestive) and diastolic (congestive) heart failure: Secondary | ICD-10-CM | POA: Diagnosis not present

## 2017-10-30 DIAGNOSIS — I313 Pericardial effusion (noninflammatory): Secondary | ICD-10-CM | POA: Diagnosis not present

## 2017-10-30 DIAGNOSIS — D631 Anemia in chronic kidney disease: Secondary | ICD-10-CM | POA: Diagnosis not present

## 2017-10-30 DIAGNOSIS — N183 Chronic kidney disease, stage 3 (moderate): Secondary | ICD-10-CM | POA: Diagnosis not present

## 2017-11-01 DIAGNOSIS — I130 Hypertensive heart and chronic kidney disease with heart failure and stage 1 through stage 4 chronic kidney disease, or unspecified chronic kidney disease: Secondary | ICD-10-CM | POA: Diagnosis not present

## 2017-11-01 DIAGNOSIS — N183 Chronic kidney disease, stage 3 (moderate): Secondary | ICD-10-CM | POA: Diagnosis not present

## 2017-11-01 DIAGNOSIS — I5042 Chronic combined systolic (congestive) and diastolic (congestive) heart failure: Secondary | ICD-10-CM | POA: Diagnosis not present

## 2017-11-01 DIAGNOSIS — I2510 Atherosclerotic heart disease of native coronary artery without angina pectoris: Secondary | ICD-10-CM | POA: Diagnosis not present

## 2017-11-01 DIAGNOSIS — I313 Pericardial effusion (noninflammatory): Secondary | ICD-10-CM | POA: Diagnosis not present

## 2017-11-01 DIAGNOSIS — D631 Anemia in chronic kidney disease: Secondary | ICD-10-CM | POA: Diagnosis not present

## 2017-11-02 DIAGNOSIS — I5042 Chronic combined systolic (congestive) and diastolic (congestive) heart failure: Secondary | ICD-10-CM | POA: Diagnosis not present

## 2017-11-02 DIAGNOSIS — D631 Anemia in chronic kidney disease: Secondary | ICD-10-CM | POA: Diagnosis not present

## 2017-11-02 DIAGNOSIS — N183 Chronic kidney disease, stage 3 (moderate): Secondary | ICD-10-CM | POA: Diagnosis not present

## 2017-11-02 DIAGNOSIS — I2510 Atherosclerotic heart disease of native coronary artery without angina pectoris: Secondary | ICD-10-CM | POA: Diagnosis not present

## 2017-11-02 DIAGNOSIS — I130 Hypertensive heart and chronic kidney disease with heart failure and stage 1 through stage 4 chronic kidney disease, or unspecified chronic kidney disease: Secondary | ICD-10-CM | POA: Diagnosis not present

## 2017-11-02 DIAGNOSIS — I313 Pericardial effusion (noninflammatory): Secondary | ICD-10-CM | POA: Diagnosis not present

## 2017-11-03 DIAGNOSIS — I2510 Atherosclerotic heart disease of native coronary artery without angina pectoris: Secondary | ICD-10-CM | POA: Diagnosis not present

## 2017-11-03 DIAGNOSIS — I5042 Chronic combined systolic (congestive) and diastolic (congestive) heart failure: Secondary | ICD-10-CM | POA: Diagnosis not present

## 2017-11-03 DIAGNOSIS — D631 Anemia in chronic kidney disease: Secondary | ICD-10-CM | POA: Diagnosis not present

## 2017-11-03 DIAGNOSIS — I130 Hypertensive heart and chronic kidney disease with heart failure and stage 1 through stage 4 chronic kidney disease, or unspecified chronic kidney disease: Secondary | ICD-10-CM | POA: Diagnosis not present

## 2017-11-03 DIAGNOSIS — I313 Pericardial effusion (noninflammatory): Secondary | ICD-10-CM | POA: Diagnosis not present

## 2017-11-03 DIAGNOSIS — N183 Chronic kidney disease, stage 3 (moderate): Secondary | ICD-10-CM | POA: Diagnosis not present

## 2017-11-04 DIAGNOSIS — I5042 Chronic combined systolic (congestive) and diastolic (congestive) heart failure: Secondary | ICD-10-CM | POA: Diagnosis not present

## 2017-11-04 DIAGNOSIS — F0280 Dementia in other diseases classified elsewhere without behavioral disturbance: Secondary | ICD-10-CM | POA: Diagnosis not present

## 2017-11-04 DIAGNOSIS — N183 Chronic kidney disease, stage 3 (moderate): Secondary | ICD-10-CM | POA: Diagnosis not present

## 2017-11-04 DIAGNOSIS — I313 Pericardial effusion (noninflammatory): Secondary | ICD-10-CM | POA: Diagnosis not present

## 2017-11-04 DIAGNOSIS — Z8546 Personal history of malignant neoplasm of prostate: Secondary | ICD-10-CM | POA: Diagnosis not present

## 2017-11-04 DIAGNOSIS — D631 Anemia in chronic kidney disease: Secondary | ICD-10-CM | POA: Diagnosis not present

## 2017-11-04 DIAGNOSIS — I2510 Atherosclerotic heart disease of native coronary artery without angina pectoris: Secondary | ICD-10-CM | POA: Diagnosis not present

## 2017-11-04 DIAGNOSIS — I130 Hypertensive heart and chronic kidney disease with heart failure and stage 1 through stage 4 chronic kidney disease, or unspecified chronic kidney disease: Secondary | ICD-10-CM | POA: Diagnosis not present

## 2017-11-04 DIAGNOSIS — G301 Alzheimer's disease with late onset: Secondary | ICD-10-CM | POA: Diagnosis not present

## 2017-11-04 DIAGNOSIS — Z8551 Personal history of malignant neoplasm of bladder: Secondary | ICD-10-CM | POA: Diagnosis not present

## 2017-11-05 DIAGNOSIS — I2510 Atherosclerotic heart disease of native coronary artery without angina pectoris: Secondary | ICD-10-CM | POA: Diagnosis not present

## 2017-11-05 DIAGNOSIS — D631 Anemia in chronic kidney disease: Secondary | ICD-10-CM | POA: Diagnosis not present

## 2017-11-05 DIAGNOSIS — I313 Pericardial effusion (noninflammatory): Secondary | ICD-10-CM | POA: Diagnosis not present

## 2017-11-05 DIAGNOSIS — I130 Hypertensive heart and chronic kidney disease with heart failure and stage 1 through stage 4 chronic kidney disease, or unspecified chronic kidney disease: Secondary | ICD-10-CM | POA: Diagnosis not present

## 2017-11-05 DIAGNOSIS — N183 Chronic kidney disease, stage 3 (moderate): Secondary | ICD-10-CM | POA: Diagnosis not present

## 2017-11-05 DIAGNOSIS — I5042 Chronic combined systolic (congestive) and diastolic (congestive) heart failure: Secondary | ICD-10-CM | POA: Diagnosis not present

## 2017-11-06 DIAGNOSIS — I5042 Chronic combined systolic (congestive) and diastolic (congestive) heart failure: Secondary | ICD-10-CM | POA: Diagnosis not present

## 2017-11-06 DIAGNOSIS — I130 Hypertensive heart and chronic kidney disease with heart failure and stage 1 through stage 4 chronic kidney disease, or unspecified chronic kidney disease: Secondary | ICD-10-CM | POA: Diagnosis not present

## 2017-11-06 DIAGNOSIS — I313 Pericardial effusion (noninflammatory): Secondary | ICD-10-CM | POA: Diagnosis not present

## 2017-11-06 DIAGNOSIS — D631 Anemia in chronic kidney disease: Secondary | ICD-10-CM | POA: Diagnosis not present

## 2017-11-06 DIAGNOSIS — N183 Chronic kidney disease, stage 3 (moderate): Secondary | ICD-10-CM | POA: Diagnosis not present

## 2017-11-06 DIAGNOSIS — I2510 Atherosclerotic heart disease of native coronary artery without angina pectoris: Secondary | ICD-10-CM | POA: Diagnosis not present

## 2017-11-06 IMAGING — CR DG CHEST 1V PORT
1 series · 1 of 1 positions shown · non-contrast
Comparison: 02/27/2016

CLINICAL DATA: Chest pain.  Sinus bradycardia.

EXAM:
PORTABLE CHEST 1 VIEW

[AP]
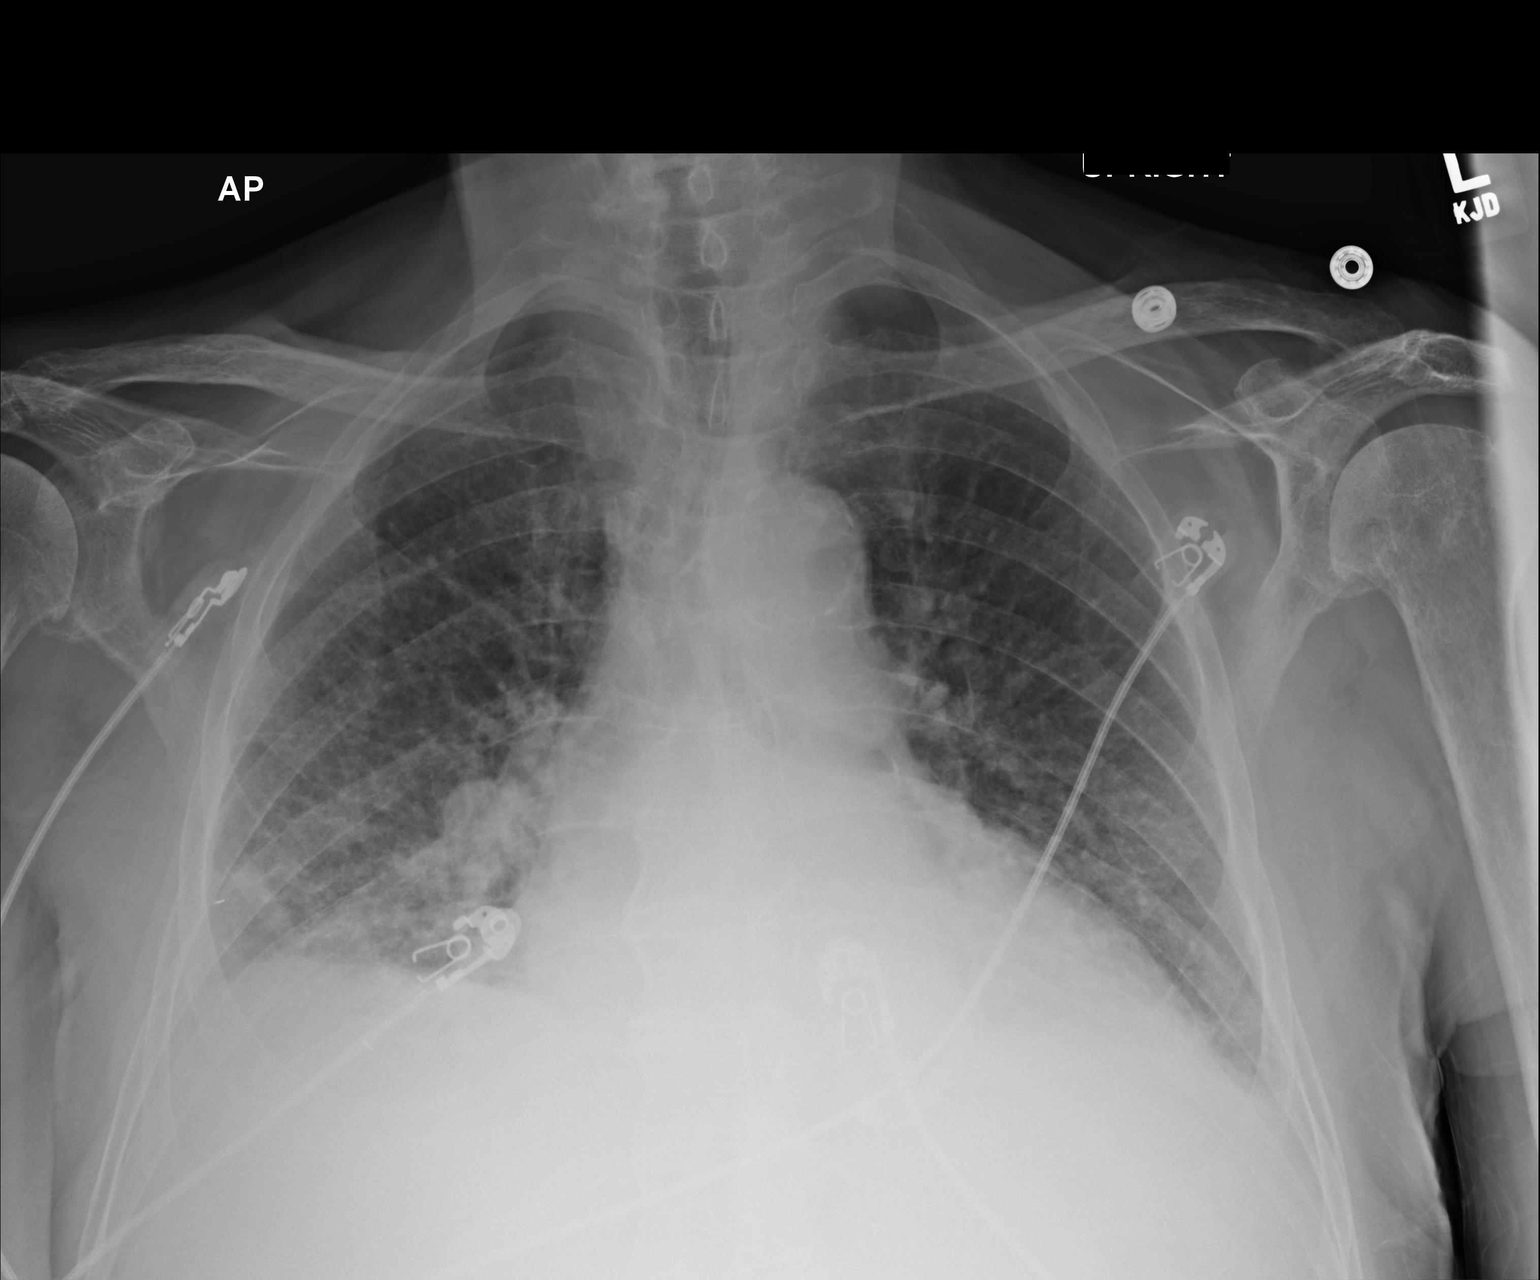

[1 of 1 positions shown; findings below may reference images not displayed]

FINDINGS: Shallow inspiration with elevation of the right hemidiaphragm.
Probable small bilateral pleural effusions. Atelectasis in the lung
bases. Mild cardiac enlargement with mild pulmonary vascular
congestion. No edema. No pneumothorax. Calcified and tortuous aorta.
Degenerative changes in the spine and shoulders peer
IMPRESSION: Small bilateral pleural effusions with basilar atelectasis. Cardiac
enlargement with mild pulmonary vascular congestion. No edema.

## 2017-11-10 DIAGNOSIS — N183 Chronic kidney disease, stage 3 (moderate): Secondary | ICD-10-CM | POA: Diagnosis not present

## 2017-11-10 DIAGNOSIS — I313 Pericardial effusion (noninflammatory): Secondary | ICD-10-CM | POA: Diagnosis not present

## 2017-11-10 DIAGNOSIS — I130 Hypertensive heart and chronic kidney disease with heart failure and stage 1 through stage 4 chronic kidney disease, or unspecified chronic kidney disease: Secondary | ICD-10-CM | POA: Diagnosis not present

## 2017-11-10 DIAGNOSIS — D631 Anemia in chronic kidney disease: Secondary | ICD-10-CM | POA: Diagnosis not present

## 2017-11-10 DIAGNOSIS — I2510 Atherosclerotic heart disease of native coronary artery without angina pectoris: Secondary | ICD-10-CM | POA: Diagnosis not present

## 2017-11-10 DIAGNOSIS — I5042 Chronic combined systolic (congestive) and diastolic (congestive) heart failure: Secondary | ICD-10-CM | POA: Diagnosis not present

## 2017-11-11 DIAGNOSIS — N183 Chronic kidney disease, stage 3 (moderate): Secondary | ICD-10-CM | POA: Diagnosis not present

## 2017-11-11 DIAGNOSIS — I130 Hypertensive heart and chronic kidney disease with heart failure and stage 1 through stage 4 chronic kidney disease, or unspecified chronic kidney disease: Secondary | ICD-10-CM | POA: Diagnosis not present

## 2017-11-11 DIAGNOSIS — I5042 Chronic combined systolic (congestive) and diastolic (congestive) heart failure: Secondary | ICD-10-CM | POA: Diagnosis not present

## 2017-11-11 DIAGNOSIS — I2510 Atherosclerotic heart disease of native coronary artery without angina pectoris: Secondary | ICD-10-CM | POA: Diagnosis not present

## 2017-11-11 DIAGNOSIS — D631 Anemia in chronic kidney disease: Secondary | ICD-10-CM | POA: Diagnosis not present

## 2017-11-11 DIAGNOSIS — I313 Pericardial effusion (noninflammatory): Secondary | ICD-10-CM | POA: Diagnosis not present

## 2017-11-13 DIAGNOSIS — N183 Chronic kidney disease, stage 3 (moderate): Secondary | ICD-10-CM | POA: Diagnosis not present

## 2017-11-13 DIAGNOSIS — I130 Hypertensive heart and chronic kidney disease with heart failure and stage 1 through stage 4 chronic kidney disease, or unspecified chronic kidney disease: Secondary | ICD-10-CM | POA: Diagnosis not present

## 2017-11-13 DIAGNOSIS — I2510 Atherosclerotic heart disease of native coronary artery without angina pectoris: Secondary | ICD-10-CM | POA: Diagnosis not present

## 2017-11-13 DIAGNOSIS — I313 Pericardial effusion (noninflammatory): Secondary | ICD-10-CM | POA: Diagnosis not present

## 2017-11-13 DIAGNOSIS — I5042 Chronic combined systolic (congestive) and diastolic (congestive) heart failure: Secondary | ICD-10-CM | POA: Diagnosis not present

## 2017-11-13 DIAGNOSIS — D631 Anemia in chronic kidney disease: Secondary | ICD-10-CM | POA: Diagnosis not present

## 2017-11-17 DIAGNOSIS — I313 Pericardial effusion (noninflammatory): Secondary | ICD-10-CM | POA: Diagnosis not present

## 2017-11-17 DIAGNOSIS — I5042 Chronic combined systolic (congestive) and diastolic (congestive) heart failure: Secondary | ICD-10-CM | POA: Diagnosis not present

## 2017-11-17 DIAGNOSIS — N183 Chronic kidney disease, stage 3 (moderate): Secondary | ICD-10-CM | POA: Diagnosis not present

## 2017-11-17 DIAGNOSIS — I2510 Atherosclerotic heart disease of native coronary artery without angina pectoris: Secondary | ICD-10-CM | POA: Diagnosis not present

## 2017-11-17 DIAGNOSIS — D631 Anemia in chronic kidney disease: Secondary | ICD-10-CM | POA: Diagnosis not present

## 2017-11-17 DIAGNOSIS — I130 Hypertensive heart and chronic kidney disease with heart failure and stage 1 through stage 4 chronic kidney disease, or unspecified chronic kidney disease: Secondary | ICD-10-CM | POA: Diagnosis not present

## 2017-11-18 DIAGNOSIS — N183 Chronic kidney disease, stage 3 (moderate): Secondary | ICD-10-CM | POA: Diagnosis not present

## 2017-11-18 DIAGNOSIS — I313 Pericardial effusion (noninflammatory): Secondary | ICD-10-CM | POA: Diagnosis not present

## 2017-11-18 DIAGNOSIS — D631 Anemia in chronic kidney disease: Secondary | ICD-10-CM | POA: Diagnosis not present

## 2017-11-18 DIAGNOSIS — I5042 Chronic combined systolic (congestive) and diastolic (congestive) heart failure: Secondary | ICD-10-CM | POA: Diagnosis not present

## 2017-11-18 DIAGNOSIS — I130 Hypertensive heart and chronic kidney disease with heart failure and stage 1 through stage 4 chronic kidney disease, or unspecified chronic kidney disease: Secondary | ICD-10-CM | POA: Diagnosis not present

## 2017-11-18 DIAGNOSIS — I2510 Atherosclerotic heart disease of native coronary artery without angina pectoris: Secondary | ICD-10-CM | POA: Diagnosis not present

## 2017-11-20 DIAGNOSIS — D631 Anemia in chronic kidney disease: Secondary | ICD-10-CM | POA: Diagnosis not present

## 2017-11-20 DIAGNOSIS — I5042 Chronic combined systolic (congestive) and diastolic (congestive) heart failure: Secondary | ICD-10-CM | POA: Diagnosis not present

## 2017-11-20 DIAGNOSIS — I2510 Atherosclerotic heart disease of native coronary artery without angina pectoris: Secondary | ICD-10-CM | POA: Diagnosis not present

## 2017-11-20 DIAGNOSIS — I130 Hypertensive heart and chronic kidney disease with heart failure and stage 1 through stage 4 chronic kidney disease, or unspecified chronic kidney disease: Secondary | ICD-10-CM | POA: Diagnosis not present

## 2017-11-20 DIAGNOSIS — I313 Pericardial effusion (noninflammatory): Secondary | ICD-10-CM | POA: Diagnosis not present

## 2017-11-20 DIAGNOSIS — N183 Chronic kidney disease, stage 3 (moderate): Secondary | ICD-10-CM | POA: Diagnosis not present

## 2017-11-20 NOTE — Progress Notes (Signed)
FOLLOW UP  Assessment and Plan:   Labile hypertension Controlled on current medications; monitor closely due to hx of syncope/labile values Monitor blood pressure at home; patient to call if consistently greater than 150/90 or if below 90/60 Continue DASH diet.   Reminder to go to the ER if any CP, SOB, nausea, dizziness, severe HA, changes vision/speech, left arm numbness and tingling and jaw pain.  Chronic combined systolic and diastolic congestive heart failure (HCC) Weights stable/down, no signs of overload Emphasized salt restriction, less than 2000mg  a day. Encouraged daily monitoring of the patient's weight, call office if 5 lb weight loss or gain in a day.  Encouraged regular exercise. If any increasing shortness of breath, swelling, or chest pressure go to ER immediately.   CKD (chronic kidney disease) stage 3, GFR 30-59 ml/min (HCC) Increase fluids, avoid NSAIDS, monitor sugars, will monitor -     COMPLETE METABOLIC PANEL WITH GFR  Thrombocytopenia (HCC) -     CBC with Differential/Platelet  Anemia due to stage 3 chronic kidney disease (HCC) Monitor expectantly; if symptomatic will refer -     CBC with Differential/Platelet  Vitamin D deficiency Continue supplementation Check vitamin D level  Prediabetes Discussed disease and risks Discussed diet/exercise, weight management  A1C  Pure hypercholesterolemia Not treated by statin secondary to age and advancing dementia; continue diet/supplements Continue low cholesterol diet and exercise.  Defer checking level except annually as would not aggressively pursue  Depression, major, in remission ([place]) Well controlled; continue medications - seroquel reduction appears to have improved his function  Lifestyle discussed: diet/exerise, sleep hygiene, stress management, hydration  Coronary artery disease involving native coronary artery of native heart without angina pectoris Control blood pressure, cholesterol,  glucose  Hypothyroidism, unspecified type continue medications the same reminded to take on an empty stomach 30-25mins before food.  -     TSH  SDAT (senile dementia of Alzheimer's type) Continue close monitoring of cardiovascular health; cholesterol/glucose/BP Good home situation; no wandering, falls, injuries. Monitored around the clock by daughter whom he lives with and home health aid as needed - has good routine/schedule, eating and managing ADLs with assistance well, but with obvious decline. Now established with hospice, discussed today and will transition over primary care to them from now on.   Continue diet and meds as discussed. Further disposition pending results of labs. Discussed med's effects and SE's.   Over 30 minutes of exam, counseling, chart review, and critical decision making was performed.   Future Appointments  Date Time Provider [place]  07/03/2018 10:00 AM Unk Pinto, MD [facility] None    ----------------------------------------------------------------------------------------------------------------------  HPI 84 y.o. male with moderate/advanced dementia presents accompanied by daughter and caregiver for 3 month follow up on hypertension, cholesterol, prediabetes, weight and vitamin D deficiency. Aggressive CHF/cholesterol treatments have not been pursued secondary to advancing dementia. He has a diagnosis of depression and is currently on lexapro 10 mg, seroquel 50 mg, reports symptoms are well controlled with improved alertness on reduced dose.   [facility] has been [facility] to [facility] and discussing transitioning primary care responsibilities. He wears 4.5-5L PRN O2 at home which is managed by hospice. He is able to ambulate with walker minimally at home, very unsteady. He is incontinent and wears depends.   BMI is Body mass index is 23.31 kg/m., he with 8 lb weight loss in last 6 months.  Wt Readings from Last 3 Encounters:   11/21/17 146 lb 9.6 oz (66.5 kg)  05/30/17  154 lb 12.8 oz (70.2 kg)  04/26/17 159 lb (72.1 kg)   His blood pressure has been controlled at home, today their BP is BP: (!) 96/58  He does not workout. He denies chest pain, shortness of breath, dizziness.   He is not on cholesterol medication secondary to age. His cholesterol is not at goal. The cholesterol last visit was:   Lab Results  Component Value Date   CHOL 192 02/22/2017   HDL 46 02/22/2017   LDLCALC 121 (H) 02/22/2017   TRIG 141 02/22/2017   CHOLHDL 4.2 02/22/2017    He has not been working on diet and exercise for prediabetes. Last A1C in the office was:  Lab Results  Component Value Date   HGBA1C 6.1 (H) 05/30/2017   He is on thyroid medication. His medication was not changed last visit.   Lab Results  Component Value Date   TSH 3.00 05/30/2017   Patient is on Vitamin D supplement.   Lab Results  Component Value Date   VD25OH 47 11/10/2016        Current Medications:  Current Outpatient Medications on File Prior to Visit  Medication Sig  . aspirin EC 81 MG tablet Take 81 mg by mouth daily.  . cholecalciferol 5000 units TABS Take 5,000 Units every morning by mouth. (Patient taking differently: Take 5,000 Units by mouth 3 (three) times a week. )  . escitalopram (LEXAPRO) 10 MG tablet Take 10 mg by mouth every morning.   . furosemide (LASIX) 20 MG tablet Take 1 tablet (20 mg total) by mouth daily. (Patient taking differently: Take 60 mg by mouth daily. )  . levothyroxine (SYNTHROID, LEVOTHROID) 75 MCG tablet Take 1 tablet (75 mcg total) by mouth daily.  . Omega-3 Fatty Acids (FISH OIL PO) Take 1 capsule by mouth daily.  . potassium chloride (K-DUR) 10 MEQ tablet Take 1 tablet (10 mEq total) by mouth daily.  . QUEtiapine (SEROQUEL) 100 MG tablet Take 100 mg by mouth at bedtime.   . tamsulosin (FLOMAX) 0.4 MG CAPS capsule Take 1 capsule (0.4 mg total) by mouth daily.  . Cholecalciferol (VITAMIN D) 2000 units CAPS  Take 2,000 Units by mouth daily.   No current facility-administered medications on file prior to visit.      Allergies:  Allergies  Allergen Reactions  . Penicillins Hives, Itching, Swelling and Rash       . Penicillins Rash     Medical History:  Past Medical History:  Diagnosis Date  . Bladder cancer ([place])   . Dementia   . Depression   . HTN (hypertension)    off medications  . HTN (hypertension)   . Hypothyroid   . Hypothyroidism   . Other testicular hypofunction   . Prediabetes   . Prostate cancer ([place])   . Stroke ([place]) 1980  . Stroke ([place])   . Vitamin D deficiency    Family history- Reviewed and unchanged Social history- Reviewed and unchanged   Review of Systems:  Review of Systems  Constitutional: Negative for malaise/fatigue and weight loss.  HENT: Negative for hearing loss and tinnitus.   Eyes: Negative for blurred vision and double vision.  Respiratory: Positive for shortness of breath (with exertion, wears O2 PRN at home). Negative for cough and wheezing.   Cardiovascular: Negative for chest pain, palpitations, orthopnea, claudication and leg swelling.  Gastrointestinal: Negative for abdominal pain, blood in stool, constipation, diarrhea, heartburn, melena, nausea and vomiting.  Genitourinary: Negative.   Musculoskeletal: Positive for falls (  1 month ago, no injury). Negative for joint pain and myalgias.  Skin: Negative for rash.  Neurological: Negative for dizziness, tingling, sensory change, weakness and headaches.  Endo/Heme/Allergies: Negative for polydipsia.  Psychiatric/Behavioral: Negative.   All other systems reviewed and are negative.   Physical Exam: BP (!) 96/58   Pulse 66   Temp (!) 97 F (36.1 C)   Ht 5' 6.5" (1.689 m)   Wt 146 lb 9.6 oz (66.5 kg)   SpO2 97%   BMI 23.31 kg/m  Wt Readings from Last 3 Encounters:  11/21/17 146 lb 9.6 oz (66.5 kg)  05/30/17 154 lb 12.8 oz (70.2 kg)  04/26/17 159 lb (72.1 kg)   General Appearance:  Frail elder in wheelchair, in no apparent distress. Eyes: PERRLA, EOMs, conjunctiva no swelling or erythema ENT/Mouth: Ext aud canals clear, TMs without erythema, bulging. No erythema, swelling, or exudate on post pharynx.  Tonsils not swollen or erythematous. HOH with bilateral hearing aids.  Neck: Supple, thyroid normal.  Respiratory: Respiratory effort normal, BS equal bilaterally without rales, rhonchi, wheezing or stridor.  Cardio: RRR with no MRGs. Symmetrical distal pulses without significant LE edema Abdomen: Soft, + BS.  Non tender, no guarding, rebound, hernias, masses. Lymphatics: Non tender without lymphadenopathy.  Musculoskeletal: symmetrical strength, 3/5, in wheelchair Skin: Warm, dry without rashes, lesions, ecchymosis.  Psych: Awake, nonverbal, able to nod/shake head to questions, normal affect  Izora Ribas, NP 11:33 AM [facility]

## 2017-11-21 ENCOUNTER — Ambulatory Visit (INDEPENDENT_AMBULATORY_CARE_PROVIDER_SITE_OTHER): Payer: Medicare Other | Admitting: Adult Health

## 2017-11-21 ENCOUNTER — Encounter: Payer: Self-pay | Admitting: Adult Health

## 2017-11-21 VITALS — BP 96/58 | HR 66 | Temp 97.0°F | Ht 66.5 in | Wt 146.6 lb

## 2017-11-21 DIAGNOSIS — R7303 Prediabetes: Secondary | ICD-10-CM | POA: Diagnosis not present

## 2017-11-21 DIAGNOSIS — N183 Chronic kidney disease, stage 3 unspecified: Secondary | ICD-10-CM

## 2017-11-21 DIAGNOSIS — I5042 Chronic combined systolic (congestive) and diastolic (congestive) heart failure: Secondary | ICD-10-CM | POA: Diagnosis not present

## 2017-11-21 DIAGNOSIS — F0150 Vascular dementia without behavioral disturbance: Secondary | ICD-10-CM | POA: Diagnosis not present

## 2017-11-21 DIAGNOSIS — Z23 Encounter for immunization: Secondary | ICD-10-CM

## 2017-11-21 DIAGNOSIS — I2510 Atherosclerotic heart disease of native coronary artery without angina pectoris: Secondary | ICD-10-CM

## 2017-11-21 DIAGNOSIS — E559 Vitamin D deficiency, unspecified: Secondary | ICD-10-CM

## 2017-11-21 DIAGNOSIS — I10 Essential (primary) hypertension: Secondary | ICD-10-CM | POA: Diagnosis not present

## 2017-11-21 DIAGNOSIS — D631 Anemia in chronic kidney disease: Secondary | ICD-10-CM | POA: Diagnosis not present

## 2017-11-21 DIAGNOSIS — Z79899 Other long term (current) drug therapy: Secondary | ICD-10-CM

## 2017-11-21 DIAGNOSIS — D696 Thrombocytopenia, unspecified: Secondary | ICD-10-CM

## 2017-11-21 DIAGNOSIS — E7800 Pure hypercholesterolemia, unspecified: Secondary | ICD-10-CM

## 2017-11-21 DIAGNOSIS — E039 Hypothyroidism, unspecified: Secondary | ICD-10-CM

## 2017-11-21 NOTE — Patient Instructions (Signed)
Dementia Caregiver Guide Dementia is a term used to describe a number of symptoms that affect memory and thinking. The most common symptoms include:  Memory loss.  Trouble with language and communication.  Trouble concentrating.  Poor judgment.  Problems with reasoning.  Child-like behavior and language.  Extreme anxiety.  Angry outbursts.  Wandering from home or public places.  Dementia usually gets worse slowly over time. In the early stages, people with dementia can stay independent and safe with some help. In later stages, they need help with daily tasks such as dressing, grooming, and using the bathroom. How to help the person with dementia cope Dementia can be frightening and confusing. Here are some tips to help the person with dementia cope with changes caused by the disease. General tips  Keep the person on track with his or her routine.  Try to identify areas where the person may need help.  Be supportive, patient, calm, and encouraging.  Gently remind the person that adjusting to changes takes time.  Help with the tasks that the person has asked for help with.  Keep the person involved in daily tasks and decisions as much as possible.  Encourage conversation, but try not to get frustrated or harried if the person struggles to find words or does not seem to appreciate your help. Communication tips  When the person is talking or seems frustrated, make eye contact and hold the person's hand.  Ask specific questions that need yes or no answers.  Use simple words, short sentences, and a calm voice. Only give one direction at a time.  When offering choices, limit them to just 1 or 2.  Avoid correcting the person in a negative way.  If the person is struggling to find the right words, gently try to help him or her. How to recognize symptoms of stress Symptoms of stress in caregivers include:  Feeling frustrated or angry with the person with  dementia.  Denying that the person has dementia or that his or her symptoms will not improve.  Feeling hopeless and unappreciated.  Difficulty sleeping.  Difficulty concentrating.  Feeling anxious, irritable, or depressed.  Developing stress-related health problems.  Feeling like you have too little time for your own life.  Follow these instructions at home:  Make sure that you and the person you are caring for: ? Get regular sleep. ? Exercise regularly. ? Eat regular, nutritious meals. ? Drink enough fluid to keep your urine clear or pale yellow. ? Take over-the-counter and prescription medicines only as told by your health care providers. ? Attend all scheduled health care appointments.  Join a support group with others who are caregivers.  Ask about respite care resources so that you can have a regular break from the stress of caregiving.  Look for signs of stress in yourself and in the person you are caring for. If you notice signs of stress, take steps to manage it.  Consider any safety risks and take steps to avoid them.  Organize medications in a pill box for each day of the week.  Create a plan to handle any legal or financial matters. Get legal or financial advice if needed.  Keep a calendar in a central location to remind the person of appointments or other activities. Tips for reducing the risk of injury  Keep floors clear of clutter. Remove rugs, magazine racks, and floor lamps.  Keep hallways well lit, especially at night.  Put a handrail and nonslip mat in the bathtub   or shower.  Put childproof locks on cabinets that contain dangerous items, such as medicines, alcohol, guns, toxic cleaning items, sharp tools or utensils, matches, and lighters.  Put the locks in places where the person cannot see or reach them easily. This will help ensure that the person does not wander out of the house and get lost.  Be prepared for emergencies. Keep a list of  emergency phone numbers and addresses in a convenient area.  Remove car keys and lock garage doors so that the person does not try to get in the car and drive.  Have the person wear a bracelet that tracks locations and identifies the person as having memory problems. This should be worn at all times for safety. Where to find support: Many individuals and organizations offer support. These include:  Support groups for people with dementia and for caregivers.  Counselors or therapists.  Home health care services.  Adult day care centers.  Where to find more information: Alzheimer's Association: www.alz.org Contact a health care provider if:  The person's health is rapidly getting worse.  You are no longer able to care for the person.  Caring for the person is affecting your physical and emotional health.  The person threatens himself or herself, you, or anyone else. Summary  Dementia is a term used to describe a number of symptoms that affect memory and thinking.  Dementia usually gets worse slowly over time.  Take steps to reduce the person's risk of injury, and to plan for future care.  Caregivers need support, relief from caregiving, and time for their own lives. This information is not intended to replace advice given to you by your health care provider. Make sure you discuss any questions you have with your health care provider. Document Released: 01/25/2016 Document Revised: 01/25/2016 Document Reviewed: 01/25/2016 Elsevier Interactive Patient Education  2018 [employer]  

## 2017-11-22 LAB — COMPLETE METABOLIC PANEL WITH GFR
AG Ratio: 1.6 (calc) (ref 1.0–2.5)
ALBUMIN MSPROF: 4.1 g/dL (ref 3.6–5.1)
ALT: 12 U/L (ref 9–46)
AST: 17 U/L (ref 10–35)
Alkaline phosphatase (APISO): 189 U/L — ABNORMAL HIGH (ref 40–115)
BUN/Creatinine Ratio: 15 (calc) (ref 6–22)
BUN: 18 mg/dL (ref 7–25)
CO2: 23 mmol/L (ref 20–32)
CREATININE: 1.22 mg/dL — AB (ref 0.70–1.11)
Calcium: 9.9 mg/dL (ref 8.6–10.3)
Chloride: 103 mmol/L (ref 98–110)
GFR, EST AFRICAN AMERICAN: 61 mL/min/{1.73_m2} (ref >=60)
GFR, EST NON AFRICAN AMERICAN: 53 mL/min/{1.73_m2} — AB (ref >=60)
GLOBULIN: 2.6 g/dL (ref 1.9–3.7)
Glucose, Bld: 88 mg/dL (ref 65–99)
Potassium: 3.9 mmol/L (ref 3.5–5.3)
SODIUM: 139 mmol/L (ref 135–146)
TOTAL PROTEIN: 6.7 g/dL (ref 6.1–8.1)
Total Bilirubin: 2.4 mg/dL — ABNORMAL HIGH (ref 0.2–1.2)

## 2017-11-22 LAB — CBC WITH DIFFERENTIAL/PLATELET
BASOS ABS: 38 {cells}/uL (ref 0–200)
BASOS PCT: 0.8 %
EOS PCT: 2.7 %
Eosinophils Absolute: 130 cells/uL (ref 15–500)
HCT: 41.3 % (ref 38.5–50.0)
HEMOGLOBIN: 13.3 g/dL (ref 13.2–17.1)
LYMPHS ABS: 782 {cells}/uL — AB (ref 850–3900)
MCH: 28.2 pg (ref 27.0–33.0)
MCHC: 32.2 g/dL (ref 32.0–36.0)
MCV: 87.5 fL (ref 80.0–100.0)
MPV: 12.7 fL — AB (ref 7.5–12.5)
Monocytes Relative: 7.5 %
NEUTROS ABS: 3490 {cells}/uL (ref 1500–7800)
Neutrophils Relative %: 72.7 %
Platelets: 142 10*3/uL (ref 140–400)
RBC: 4.72 10*6/uL (ref 4.20–5.80)
RDW: 17 % — ABNORMAL HIGH (ref 11.0–15.0)
Total Lymphocyte: 16.3 %
WBC mixed population: 360 cells/uL (ref 200–950)
WBC: 4.8 10*3/uL (ref 3.8–10.8)

## 2017-11-22 LAB — HEMOGLOBIN A1C
HEMOGLOBIN A1C: 5.8 %{Hb} — AB (ref <5.7)
MEAN PLASMA GLUCOSE: 120 (calc)
eAG (mmol/L): 6.6 (calc)

## 2017-11-22 LAB — TSH: TSH: 2.58 m[IU]/L (ref 0.40–4.50)

## 2017-11-23 ENCOUNTER — Telehealth: Payer: Self-pay | Admitting: Internal Medicine

## 2017-11-23 NOTE — Telephone Encounter (Signed)
Daughter called and questions were answered.

## 2017-11-23 NOTE — Telephone Encounter (Signed)
please call regarding anemia, concerned about what can be done to improve.

## 2017-11-24 DIAGNOSIS — I130 Hypertensive heart and chronic kidney disease with heart failure and stage 1 through stage 4 chronic kidney disease, or unspecified chronic kidney disease: Secondary | ICD-10-CM | POA: Diagnosis not present

## 2017-11-24 DIAGNOSIS — I5042 Chronic combined systolic (congestive) and diastolic (congestive) heart failure: Secondary | ICD-10-CM | POA: Diagnosis not present

## 2017-11-24 DIAGNOSIS — N183 Chronic kidney disease, stage 3 (moderate): Secondary | ICD-10-CM | POA: Diagnosis not present

## 2017-11-24 DIAGNOSIS — D631 Anemia in chronic kidney disease: Secondary | ICD-10-CM | POA: Diagnosis not present

## 2017-11-24 DIAGNOSIS — I2510 Atherosclerotic heart disease of native coronary artery without angina pectoris: Secondary | ICD-10-CM | POA: Diagnosis not present

## 2017-11-24 DIAGNOSIS — I313 Pericardial effusion (noninflammatory): Secondary | ICD-10-CM | POA: Diagnosis not present

## 2017-11-25 DIAGNOSIS — I130 Hypertensive heart and chronic kidney disease with heart failure and stage 1 through stage 4 chronic kidney disease, or unspecified chronic kidney disease: Secondary | ICD-10-CM | POA: Diagnosis not present

## 2017-11-25 DIAGNOSIS — I313 Pericardial effusion (noninflammatory): Secondary | ICD-10-CM | POA: Diagnosis not present

## 2017-11-25 DIAGNOSIS — N183 Chronic kidney disease, stage 3 (moderate): Secondary | ICD-10-CM | POA: Diagnosis not present

## 2017-11-25 DIAGNOSIS — D631 Anemia in chronic kidney disease: Secondary | ICD-10-CM | POA: Diagnosis not present

## 2017-11-25 DIAGNOSIS — I2510 Atherosclerotic heart disease of native coronary artery without angina pectoris: Secondary | ICD-10-CM | POA: Diagnosis not present

## 2017-11-25 DIAGNOSIS — I5042 Chronic combined systolic (congestive) and diastolic (congestive) heart failure: Secondary | ICD-10-CM | POA: Diagnosis not present

## 2017-11-27 DIAGNOSIS — I2510 Atherosclerotic heart disease of native coronary artery without angina pectoris: Secondary | ICD-10-CM | POA: Diagnosis not present

## 2017-11-27 DIAGNOSIS — I130 Hypertensive heart and chronic kidney disease with heart failure and stage 1 through stage 4 chronic kidney disease, or unspecified chronic kidney disease: Secondary | ICD-10-CM | POA: Diagnosis not present

## 2017-11-27 DIAGNOSIS — I5042 Chronic combined systolic (congestive) and diastolic (congestive) heart failure: Secondary | ICD-10-CM | POA: Diagnosis not present

## 2017-11-27 DIAGNOSIS — N183 Chronic kidney disease, stage 3 (moderate): Secondary | ICD-10-CM | POA: Diagnosis not present

## 2017-11-27 DIAGNOSIS — I313 Pericardial effusion (noninflammatory): Secondary | ICD-10-CM | POA: Diagnosis not present

## 2017-11-27 DIAGNOSIS — D631 Anemia in chronic kidney disease: Secondary | ICD-10-CM | POA: Diagnosis not present

## 2017-12-01 DIAGNOSIS — I2510 Atherosclerotic heart disease of native coronary artery without angina pectoris: Secondary | ICD-10-CM | POA: Diagnosis not present

## 2017-12-01 DIAGNOSIS — I130 Hypertensive heart and chronic kidney disease with heart failure and stage 1 through stage 4 chronic kidney disease, or unspecified chronic kidney disease: Secondary | ICD-10-CM | POA: Diagnosis not present

## 2017-12-01 DIAGNOSIS — D631 Anemia in chronic kidney disease: Secondary | ICD-10-CM | POA: Diagnosis not present

## 2017-12-01 DIAGNOSIS — N183 Chronic kidney disease, stage 3 (moderate): Secondary | ICD-10-CM | POA: Diagnosis not present

## 2017-12-01 DIAGNOSIS — I5042 Chronic combined systolic (congestive) and diastolic (congestive) heart failure: Secondary | ICD-10-CM | POA: Diagnosis not present

## 2017-12-01 DIAGNOSIS — I313 Pericardial effusion (noninflammatory): Secondary | ICD-10-CM | POA: Diagnosis not present

## 2017-12-02 DIAGNOSIS — I2510 Atherosclerotic heart disease of native coronary artery without angina pectoris: Secondary | ICD-10-CM | POA: Diagnosis not present

## 2017-12-02 DIAGNOSIS — I313 Pericardial effusion (noninflammatory): Secondary | ICD-10-CM | POA: Diagnosis not present

## 2017-12-02 DIAGNOSIS — D631 Anemia in chronic kidney disease: Secondary | ICD-10-CM | POA: Diagnosis not present

## 2017-12-02 DIAGNOSIS — I5042 Chronic combined systolic (congestive) and diastolic (congestive) heart failure: Secondary | ICD-10-CM | POA: Diagnosis not present

## 2017-12-02 DIAGNOSIS — I130 Hypertensive heart and chronic kidney disease with heart failure and stage 1 through stage 4 chronic kidney disease, or unspecified chronic kidney disease: Secondary | ICD-10-CM | POA: Diagnosis not present

## 2017-12-02 DIAGNOSIS — N183 Chronic kidney disease, stage 3 (moderate): Secondary | ICD-10-CM | POA: Diagnosis not present

## 2017-12-04 DIAGNOSIS — Z8546 Personal history of malignant neoplasm of prostate: Secondary | ICD-10-CM | POA: Diagnosis not present

## 2017-12-04 DIAGNOSIS — I5042 Chronic combined systolic (congestive) and diastolic (congestive) heart failure: Secondary | ICD-10-CM | POA: Diagnosis not present

## 2017-12-04 DIAGNOSIS — I313 Pericardial effusion (noninflammatory): Secondary | ICD-10-CM | POA: Diagnosis not present

## 2017-12-04 DIAGNOSIS — F0280 Dementia in other diseases classified elsewhere without behavioral disturbance: Secondary | ICD-10-CM | POA: Diagnosis not present

## 2017-12-04 DIAGNOSIS — D631 Anemia in chronic kidney disease: Secondary | ICD-10-CM | POA: Diagnosis not present

## 2017-12-04 DIAGNOSIS — N183 Chronic kidney disease, stage 3 (moderate): Secondary | ICD-10-CM | POA: Diagnosis not present

## 2017-12-04 DIAGNOSIS — G301 Alzheimer's disease with late onset: Secondary | ICD-10-CM | POA: Diagnosis not present

## 2017-12-04 DIAGNOSIS — Z8551 Personal history of malignant neoplasm of bladder: Secondary | ICD-10-CM | POA: Diagnosis not present

## 2017-12-04 DIAGNOSIS — I130 Hypertensive heart and chronic kidney disease with heart failure and stage 1 through stage 4 chronic kidney disease, or unspecified chronic kidney disease: Secondary | ICD-10-CM | POA: Diagnosis not present

## 2017-12-04 DIAGNOSIS — I2510 Atherosclerotic heart disease of native coronary artery without angina pectoris: Secondary | ICD-10-CM | POA: Diagnosis not present

## 2017-12-07 DIAGNOSIS — N183 Chronic kidney disease, stage 3 (moderate): Secondary | ICD-10-CM | POA: Diagnosis not present

## 2017-12-07 DIAGNOSIS — I313 Pericardial effusion (noninflammatory): Secondary | ICD-10-CM | POA: Diagnosis not present

## 2017-12-07 DIAGNOSIS — I2510 Atherosclerotic heart disease of native coronary artery without angina pectoris: Secondary | ICD-10-CM | POA: Diagnosis not present

## 2017-12-07 DIAGNOSIS — I5042 Chronic combined systolic (congestive) and diastolic (congestive) heart failure: Secondary | ICD-10-CM | POA: Diagnosis not present

## 2017-12-07 DIAGNOSIS — D631 Anemia in chronic kidney disease: Secondary | ICD-10-CM | POA: Diagnosis not present

## 2017-12-07 DIAGNOSIS — I130 Hypertensive heart and chronic kidney disease with heart failure and stage 1 through stage 4 chronic kidney disease, or unspecified chronic kidney disease: Secondary | ICD-10-CM | POA: Diagnosis not present

## 2017-12-08 DIAGNOSIS — I5042 Chronic combined systolic (congestive) and diastolic (congestive) heart failure: Secondary | ICD-10-CM | POA: Diagnosis not present

## 2017-12-08 DIAGNOSIS — N183 Chronic kidney disease, stage 3 (moderate): Secondary | ICD-10-CM | POA: Diagnosis not present

## 2017-12-08 DIAGNOSIS — I2510 Atherosclerotic heart disease of native coronary artery without angina pectoris: Secondary | ICD-10-CM | POA: Diagnosis not present

## 2017-12-08 DIAGNOSIS — I130 Hypertensive heart and chronic kidney disease with heart failure and stage 1 through stage 4 chronic kidney disease, or unspecified chronic kidney disease: Secondary | ICD-10-CM | POA: Diagnosis not present

## 2017-12-08 DIAGNOSIS — I313 Pericardial effusion (noninflammatory): Secondary | ICD-10-CM | POA: Diagnosis not present

## 2017-12-08 DIAGNOSIS — D631 Anemia in chronic kidney disease: Secondary | ICD-10-CM | POA: Diagnosis not present

## 2017-12-09 DIAGNOSIS — I5042 Chronic combined systolic (congestive) and diastolic (congestive) heart failure: Secondary | ICD-10-CM | POA: Diagnosis not present

## 2017-12-09 DIAGNOSIS — I2510 Atherosclerotic heart disease of native coronary artery without angina pectoris: Secondary | ICD-10-CM | POA: Diagnosis not present

## 2017-12-09 DIAGNOSIS — D631 Anemia in chronic kidney disease: Secondary | ICD-10-CM | POA: Diagnosis not present

## 2017-12-09 DIAGNOSIS — I130 Hypertensive heart and chronic kidney disease with heart failure and stage 1 through stage 4 chronic kidney disease, or unspecified chronic kidney disease: Secondary | ICD-10-CM | POA: Diagnosis not present

## 2017-12-09 DIAGNOSIS — N183 Chronic kidney disease, stage 3 (moderate): Secondary | ICD-10-CM | POA: Diagnosis not present

## 2017-12-09 DIAGNOSIS — I313 Pericardial effusion (noninflammatory): Secondary | ICD-10-CM | POA: Diagnosis not present

## 2017-12-11 DIAGNOSIS — D631 Anemia in chronic kidney disease: Secondary | ICD-10-CM | POA: Diagnosis not present

## 2017-12-11 DIAGNOSIS — I130 Hypertensive heart and chronic kidney disease with heart failure and stage 1 through stage 4 chronic kidney disease, or unspecified chronic kidney disease: Secondary | ICD-10-CM | POA: Diagnosis not present

## 2017-12-11 DIAGNOSIS — N183 Chronic kidney disease, stage 3 (moderate): Secondary | ICD-10-CM | POA: Diagnosis not present

## 2017-12-11 DIAGNOSIS — I313 Pericardial effusion (noninflammatory): Secondary | ICD-10-CM | POA: Diagnosis not present

## 2017-12-11 DIAGNOSIS — I5042 Chronic combined systolic (congestive) and diastolic (congestive) heart failure: Secondary | ICD-10-CM | POA: Diagnosis not present

## 2017-12-11 DIAGNOSIS — I2510 Atherosclerotic heart disease of native coronary artery without angina pectoris: Secondary | ICD-10-CM | POA: Diagnosis not present

## 2017-12-13 DIAGNOSIS — I313 Pericardial effusion (noninflammatory): Secondary | ICD-10-CM | POA: Diagnosis not present

## 2017-12-13 DIAGNOSIS — N183 Chronic kidney disease, stage 3 (moderate): Secondary | ICD-10-CM | POA: Diagnosis not present

## 2017-12-13 DIAGNOSIS — I130 Hypertensive heart and chronic kidney disease with heart failure and stage 1 through stage 4 chronic kidney disease, or unspecified chronic kidney disease: Secondary | ICD-10-CM | POA: Diagnosis not present

## 2017-12-13 DIAGNOSIS — D631 Anemia in chronic kidney disease: Secondary | ICD-10-CM | POA: Diagnosis not present

## 2017-12-13 DIAGNOSIS — I5042 Chronic combined systolic (congestive) and diastolic (congestive) heart failure: Secondary | ICD-10-CM | POA: Diagnosis not present

## 2017-12-13 DIAGNOSIS — I2510 Atherosclerotic heart disease of native coronary artery without angina pectoris: Secondary | ICD-10-CM | POA: Diagnosis not present

## 2017-12-15 DIAGNOSIS — I5042 Chronic combined systolic (congestive) and diastolic (congestive) heart failure: Secondary | ICD-10-CM | POA: Diagnosis not present

## 2017-12-15 DIAGNOSIS — N183 Chronic kidney disease, stage 3 (moderate): Secondary | ICD-10-CM | POA: Diagnosis not present

## 2017-12-15 DIAGNOSIS — I2510 Atherosclerotic heart disease of native coronary artery without angina pectoris: Secondary | ICD-10-CM | POA: Diagnosis not present

## 2017-12-15 DIAGNOSIS — I130 Hypertensive heart and chronic kidney disease with heart failure and stage 1 through stage 4 chronic kidney disease, or unspecified chronic kidney disease: Secondary | ICD-10-CM | POA: Diagnosis not present

## 2017-12-15 DIAGNOSIS — D631 Anemia in chronic kidney disease: Secondary | ICD-10-CM | POA: Diagnosis not present

## 2017-12-15 DIAGNOSIS — I313 Pericardial effusion (noninflammatory): Secondary | ICD-10-CM | POA: Diagnosis not present

## 2017-12-16 DIAGNOSIS — I5042 Chronic combined systolic (congestive) and diastolic (congestive) heart failure: Secondary | ICD-10-CM | POA: Diagnosis not present

## 2017-12-16 DIAGNOSIS — N183 Chronic kidney disease, stage 3 (moderate): Secondary | ICD-10-CM | POA: Diagnosis not present

## 2017-12-16 DIAGNOSIS — I2510 Atherosclerotic heart disease of native coronary artery without angina pectoris: Secondary | ICD-10-CM | POA: Diagnosis not present

## 2017-12-16 DIAGNOSIS — I313 Pericardial effusion (noninflammatory): Secondary | ICD-10-CM | POA: Diagnosis not present

## 2017-12-16 DIAGNOSIS — D631 Anemia in chronic kidney disease: Secondary | ICD-10-CM | POA: Diagnosis not present

## 2017-12-16 DIAGNOSIS — I130 Hypertensive heart and chronic kidney disease with heart failure and stage 1 through stage 4 chronic kidney disease, or unspecified chronic kidney disease: Secondary | ICD-10-CM | POA: Diagnosis not present

## 2017-12-18 DIAGNOSIS — N183 Chronic kidney disease, stage 3 (moderate): Secondary | ICD-10-CM | POA: Diagnosis not present

## 2017-12-18 DIAGNOSIS — I5042 Chronic combined systolic (congestive) and diastolic (congestive) heart failure: Secondary | ICD-10-CM | POA: Diagnosis not present

## 2017-12-18 DIAGNOSIS — I130 Hypertensive heart and chronic kidney disease with heart failure and stage 1 through stage 4 chronic kidney disease, or unspecified chronic kidney disease: Secondary | ICD-10-CM | POA: Diagnosis not present

## 2017-12-18 DIAGNOSIS — I313 Pericardial effusion (noninflammatory): Secondary | ICD-10-CM | POA: Diagnosis not present

## 2017-12-18 DIAGNOSIS — I2510 Atherosclerotic heart disease of native coronary artery without angina pectoris: Secondary | ICD-10-CM | POA: Diagnosis not present

## 2017-12-18 DIAGNOSIS — D631 Anemia in chronic kidney disease: Secondary | ICD-10-CM | POA: Diagnosis not present

## 2017-12-22 DIAGNOSIS — I313 Pericardial effusion (noninflammatory): Secondary | ICD-10-CM | POA: Diagnosis not present

## 2017-12-22 DIAGNOSIS — I130 Hypertensive heart and chronic kidney disease with heart failure and stage 1 through stage 4 chronic kidney disease, or unspecified chronic kidney disease: Secondary | ICD-10-CM | POA: Diagnosis not present

## 2017-12-22 DIAGNOSIS — D631 Anemia in chronic kidney disease: Secondary | ICD-10-CM | POA: Diagnosis not present

## 2017-12-22 DIAGNOSIS — N183 Chronic kidney disease, stage 3 (moderate): Secondary | ICD-10-CM | POA: Diagnosis not present

## 2017-12-22 DIAGNOSIS — I5042 Chronic combined systolic (congestive) and diastolic (congestive) heart failure: Secondary | ICD-10-CM | POA: Diagnosis not present

## 2017-12-22 DIAGNOSIS — I2510 Atherosclerotic heart disease of native coronary artery without angina pectoris: Secondary | ICD-10-CM | POA: Diagnosis not present

## 2018-01-04 DEATH — deceased

## 2018-07-03 ENCOUNTER — Encounter: Payer: Self-pay | Admitting: Internal Medicine
